# Patient Record
Sex: Female | Born: 1935 | Race: White | Hispanic: No | Marital: Married | Smoking: Former smoker
Health system: Southern US, Community
[De-identification: ages and names within clinical notes are randomized; demographics above are authoritative.]

## PROBLEM LIST (undated history)

## (undated) DIAGNOSIS — S060X9A Concussion with loss of consciousness of unspecified duration, initial encounter: Secondary | ICD-10-CM

## (undated) DIAGNOSIS — T4145XA Adverse effect of unspecified anesthetic, initial encounter: Secondary | ICD-10-CM

## (undated) DIAGNOSIS — K219 Gastro-esophageal reflux disease without esophagitis: Secondary | ICD-10-CM

## (undated) DIAGNOSIS — R51 Headache: Secondary | ICD-10-CM

## (undated) DIAGNOSIS — F329 Major depressive disorder, single episode, unspecified: Secondary | ICD-10-CM

## (undated) DIAGNOSIS — E785 Hyperlipidemia, unspecified: Secondary | ICD-10-CM

## (undated) DIAGNOSIS — T8859XA Other complications of anesthesia, initial encounter: Secondary | ICD-10-CM

## (undated) DIAGNOSIS — M1990 Unspecified osteoarthritis, unspecified site: Secondary | ICD-10-CM

## (undated) DIAGNOSIS — J309 Allergic rhinitis, unspecified: Secondary | ICD-10-CM

## (undated) DIAGNOSIS — K5790 Diverticulosis of intestine, part unspecified, without perforation or abscess without bleeding: Secondary | ICD-10-CM

## (undated) DIAGNOSIS — F32A Depression, unspecified: Secondary | ICD-10-CM

## (undated) DIAGNOSIS — C50919 Malignant neoplasm of unspecified site of unspecified female breast: Secondary | ICD-10-CM

## (undated) DIAGNOSIS — G709 Myoneural disorder, unspecified: Secondary | ICD-10-CM

## (undated) DIAGNOSIS — F419 Anxiety disorder, unspecified: Secondary | ICD-10-CM

## (undated) HISTORY — PX: MASTECTOMY: SHX3

## (undated) HISTORY — PX: REPLACEMENT TOTAL KNEE: SUR1224

## (undated) HISTORY — PX: STAPEDECTOMY: SHX2435

## (undated) HISTORY — DX: Malignant neoplasm of unspecified site of unspecified female breast: C50.919

## (undated) HISTORY — PX: NASAL SINUS SURGERY: SHX719

## (undated) HISTORY — DX: Allergic rhinitis, unspecified: J30.9

## (undated) HISTORY — DX: Diverticulosis of intestine, part unspecified, without perforation or abscess without bleeding: K57.90

## (undated) HISTORY — PX: TOTAL KNEE ARTHROPLASTY: SHX125

## (undated) HISTORY — PX: ABDOMINAL HYSTERECTOMY: SHX81

## (undated) HISTORY — DX: Hyperlipidemia, unspecified: E78.5

## (undated) HISTORY — PX: SHOULDER SURGERY: SHX246

---

## 1997-04-15 ENCOUNTER — Encounter: Admission: RE | Admit: 1997-04-15 | Discharge: 1997-07-14 | Payer: Self-pay

## 1997-04-20 ENCOUNTER — Ambulatory Visit (HOSPITAL_COMMUNITY): Admission: RE | Admit: 1997-04-20 | Discharge: 1997-04-20 | Payer: Self-pay

## 1998-12-26 ENCOUNTER — Other Ambulatory Visit: Admission: RE | Admit: 1998-12-26 | Discharge: 1998-12-26 | Payer: Self-pay | Admitting: Obstetrics and Gynecology

## 1999-12-26 ENCOUNTER — Other Ambulatory Visit: Admission: RE | Admit: 1999-12-26 | Discharge: 1999-12-26 | Payer: Self-pay | Admitting: Obstetrics and Gynecology

## 2001-01-06 ENCOUNTER — Other Ambulatory Visit: Admission: RE | Admit: 2001-01-06 | Discharge: 2001-01-06 | Payer: Self-pay | Admitting: Obstetrics and Gynecology

## 2001-08-18 ENCOUNTER — Encounter: Admission: RE | Admit: 2001-08-18 | Discharge: 2001-08-18 | Payer: Self-pay | Admitting: Internal Medicine

## 2001-08-18 ENCOUNTER — Encounter: Payer: Self-pay | Admitting: Internal Medicine

## 2002-09-30 ENCOUNTER — Encounter (INDEPENDENT_AMBULATORY_CARE_PROVIDER_SITE_OTHER): Payer: Self-pay | Admitting: Specialist

## 2002-09-30 ENCOUNTER — Ambulatory Visit (HOSPITAL_BASED_OUTPATIENT_CLINIC_OR_DEPARTMENT_OTHER): Admission: RE | Admit: 2002-09-30 | Discharge: 2002-09-30 | Payer: Self-pay | Admitting: Orthopedic Surgery

## 2002-12-08 ENCOUNTER — Ambulatory Visit (HOSPITAL_COMMUNITY): Admission: RE | Admit: 2002-12-08 | Discharge: 2002-12-08 | Payer: Self-pay | Admitting: Internal Medicine

## 2004-02-02 ENCOUNTER — Ambulatory Visit: Payer: Self-pay | Admitting: Oncology

## 2004-04-18 ENCOUNTER — Ambulatory Visit: Payer: Self-pay | Admitting: Oncology

## 2004-07-12 ENCOUNTER — Encounter: Admission: RE | Admit: 2004-07-12 | Discharge: 2004-07-12 | Payer: Self-pay | Admitting: Orthopedic Surgery

## 2004-07-24 ENCOUNTER — Encounter: Admission: RE | Admit: 2004-07-24 | Discharge: 2004-07-24 | Payer: Self-pay | Admitting: Orthopedic Surgery

## 2004-07-25 ENCOUNTER — Ambulatory Visit (HOSPITAL_COMMUNITY): Admission: RE | Admit: 2004-07-25 | Discharge: 2004-07-25 | Payer: Self-pay | Admitting: Orthopedic Surgery

## 2004-07-25 ENCOUNTER — Ambulatory Visit (HOSPITAL_BASED_OUTPATIENT_CLINIC_OR_DEPARTMENT_OTHER): Admission: RE | Admit: 2004-07-25 | Discharge: 2004-07-25 | Payer: Self-pay | Admitting: Orthopedic Surgery

## 2004-10-20 ENCOUNTER — Encounter: Admission: RE | Admit: 2004-10-20 | Discharge: 2004-10-20 | Payer: Self-pay | Admitting: Internal Medicine

## 2004-11-13 ENCOUNTER — Ambulatory Visit (HOSPITAL_BASED_OUTPATIENT_CLINIC_OR_DEPARTMENT_OTHER): Admission: RE | Admit: 2004-11-13 | Discharge: 2004-11-14 | Payer: Self-pay | Admitting: Orthopedic Surgery

## 2004-11-13 ENCOUNTER — Ambulatory Visit (HOSPITAL_COMMUNITY): Admission: RE | Admit: 2004-11-13 | Discharge: 2004-11-13 | Payer: Self-pay | Admitting: Orthopedic Surgery

## 2005-06-06 ENCOUNTER — Ambulatory Visit: Payer: Self-pay | Admitting: Internal Medicine

## 2005-06-06 ENCOUNTER — Encounter: Admission: RE | Admit: 2005-06-06 | Discharge: 2005-06-06 | Payer: Self-pay | Admitting: Internal Medicine

## 2005-11-29 ENCOUNTER — Ambulatory Visit (HOSPITAL_COMMUNITY): Admission: RE | Admit: 2005-11-29 | Discharge: 2005-11-29 | Payer: Self-pay | Admitting: Orthopedic Surgery

## 2006-03-13 ENCOUNTER — Ambulatory Visit: Payer: Self-pay | Admitting: Internal Medicine

## 2006-04-10 ENCOUNTER — Ambulatory Visit: Payer: Self-pay | Admitting: Gastroenterology

## 2006-05-07 ENCOUNTER — Ambulatory Visit: Payer: Self-pay | Admitting: Gastroenterology

## 2006-06-11 ENCOUNTER — Encounter: Admission: RE | Admit: 2006-06-11 | Discharge: 2006-06-11 | Payer: Self-pay | Admitting: Internal Medicine

## 2006-08-01 ENCOUNTER — Inpatient Hospital Stay (HOSPITAL_COMMUNITY): Admission: RE | Admit: 2006-08-01 | Discharge: 2006-08-02 | Payer: Self-pay | Admitting: Orthopedic Surgery

## 2007-03-14 ENCOUNTER — Ambulatory Visit: Payer: Self-pay | Admitting: Internal Medicine

## 2007-03-24 ENCOUNTER — Encounter: Payer: Self-pay | Admitting: Internal Medicine

## 2007-03-24 ENCOUNTER — Ambulatory Visit: Payer: Self-pay

## 2007-03-31 ENCOUNTER — Telehealth (INDEPENDENT_AMBULATORY_CARE_PROVIDER_SITE_OTHER): Payer: Self-pay | Admitting: *Deleted

## 2007-03-31 ENCOUNTER — Ambulatory Visit: Payer: Self-pay | Admitting: Internal Medicine

## 2007-03-31 DIAGNOSIS — K219 Gastro-esophageal reflux disease without esophagitis: Secondary | ICD-10-CM | POA: Insufficient documentation

## 2007-03-31 DIAGNOSIS — J309 Allergic rhinitis, unspecified: Secondary | ICD-10-CM | POA: Insufficient documentation

## 2007-03-31 DIAGNOSIS — J209 Acute bronchitis, unspecified: Secondary | ICD-10-CM

## 2007-04-07 ENCOUNTER — Inpatient Hospital Stay (HOSPITAL_COMMUNITY): Admission: RE | Admit: 2007-04-07 | Discharge: 2007-04-10 | Payer: Self-pay | Admitting: Orthopedic Surgery

## 2007-06-16 ENCOUNTER — Encounter: Admission: RE | Admit: 2007-06-16 | Discharge: 2007-06-16 | Payer: Self-pay | Admitting: Internal Medicine

## 2008-01-01 ENCOUNTER — Ambulatory Visit: Payer: Self-pay | Admitting: Internal Medicine

## 2008-01-14 ENCOUNTER — Telehealth (INDEPENDENT_AMBULATORY_CARE_PROVIDER_SITE_OTHER): Payer: Self-pay | Admitting: *Deleted

## 2008-05-27 ENCOUNTER — Ambulatory Visit: Payer: Self-pay | Admitting: Internal Medicine

## 2008-05-27 ENCOUNTER — Telehealth (INDEPENDENT_AMBULATORY_CARE_PROVIDER_SITE_OTHER): Payer: Self-pay | Admitting: *Deleted

## 2008-06-01 ENCOUNTER — Telehealth (INDEPENDENT_AMBULATORY_CARE_PROVIDER_SITE_OTHER): Payer: Self-pay | Admitting: *Deleted

## 2008-06-29 ENCOUNTER — Encounter: Admission: RE | Admit: 2008-06-29 | Discharge: 2008-06-29 | Payer: Self-pay | Admitting: Internal Medicine

## 2009-02-15 DEATH — deceased

## 2009-07-04 ENCOUNTER — Encounter: Admission: RE | Admit: 2009-07-04 | Discharge: 2009-07-04 | Payer: Self-pay | Admitting: Internal Medicine

## 2009-07-15 DEATH — deceased

## 2010-05-05 ENCOUNTER — Other Ambulatory Visit: Payer: Self-pay | Admitting: Dermatology

## 2010-05-30 NOTE — Discharge Summary (Signed)
[name], [name]            ACCOUNT NO.:  [id number]   MEDICAL RECORD NO.:  [id number]          PATIENT TYPE:  INP   LOCATION:  1604                         FACILITY:  [facility]   PHYSICIAN:  [name], M.D.    DATE OF BIRTH:  [birth year]   DATE OF ADMISSION:  04/07/2007  DATE OF DISCHARGE:  04/10/2007                               DISCHARGE SUMMARY   ADMITTING DIAGNOSES:  1. Osteoarthritis left knee.  2. History of bronchitis.  3. Cardiac murmur.  4. Hypercholesterolemia.  5. History of atrial fibrillation (one episode in the past).  6. Left-sided breast cancer status post mastectomy.  7. Mildly stenotic aortic valve.   DISCHARGE DIAGNOSES:  1. Osteoarthritis left knee status post left total knee replacement      arthroplasty.  2. Mild postop hypokalemia improved.  3. History of bronchitis.  4. Cardiac murmur.  5. Hypercholesterolemia.  6. History of atrial fibrillation (one episode in the past).  7. Left-sided breast cancer status post mastectomy.  8. Mildly stenotic aortic valve.   PROCEDURE:  April 07, 2007 left total knee.  Surgeon:  Dr. Lequita Harper.  Assistant:  Melanie Peace PA-C.  Surgery performed under spinal  anesthesia.  Tourniquet time 40 minutes.   CONSULTATIONS:  None.   BRIEF HISTORY:  [name] is a 77 year old female with severe  arthritis of the left knee, progressive worsening pain and dysfunction  who failed conservative management now presents for total knee.   LABORATORY DATA:  CBC preop showed hemoglobin 14.4, hematocrit 41.7.  Chem panel all within normal limits.  PT/INR 13.1 and 1, PTT 28.  Blood  group type A+.  Serial CBC's were followed.  Hemoglobin did drop down to  12.1 and then 10.9.  Last H&H 10.8 and 31.6.  Serial protimes followed.  PT/INR 24.6 and 2.1.  Chem panels followed.  Serial BMET's.  Potassium  did drop down to 3.4 and came back up to 3.7.   X-rays two view chest dated September 24, 2005:  Stable chest, no active  disease.   Preop chest April 03, 2007:  No acute findings.  EKG dated  February 14, 2007:  Borderline EKG, sinus rhythm, poor R wave progression  probably normal variant confirmed by Dr. Felipa Harper.   HOSPITAL COURSE:  The patient admitted to [facility],  tolerated the procedure well, and later transferred to the recovery room  on the orthopedic floor.  Started on PCA and p.o. analgesic pain control  following surgery.  Had a little bit of itching through the night that  was better by the morning of day #1.  Was tolerating her meds.  Started  back on her home medications.  Encouraged pulmonary toilet.  Got up out  of bed on day #1.  Had excellent urinary output.  Decreased her fluids.  Weaned over to p.o. meds.  By day #2 DC'd the PCA.  Dressing change  incision looked good.  At that time she was up and ambulating about 20  feet and later about 80 feet that afternoon.  Incision looked great, no  signs of infection, had still excellent output.  Hemoglobins 10.9.  She  was asymptomatic with mild drop.  She continued to progress well, and  day #3 she was taking p.o. meds well.  Knee looked good; progressing  with therapy.  It was decided that patient will be discharged home at  that time.   DISCHARGE/PLAN:  1. The patient discharged home April 10, 2007.  2. Discharge Diagnoses please see above.  3. Discharge meds:  Coumadin, Percocet, Robaxin.  Follow-up two weeks.   ACTIVITY:  She is weightbearing as tolerated left lower extremity.  Home  health PT, home health nursing, total knee protocol.   DISPOSITION:  Home.   CONDITION ON DISCHARGE:  Improved.      [name], P.A.C.      [name], M.D.  Electronically Signed    ALP/MEDQ  D:  04/10/2007  T:  04/10/2007  Job:  [id number]   cc:   [name], M.D.  Fax: [phone]   Melanie D. Maple Hudson, MD, FCCP, FACP  [facility]-Pulmonary Dept  [address]  [place]  [place] [address]   Melanie Salvia, MD,  [facility]  [address]  [address]  [place]  [place] [address]

## 2010-05-30 NOTE — Letter (Signed)
March 14, 2007    Larina Earthly, M.D.  [address]  [place], [place] [address]   RE:  Melanie, Harper  MRN:  [id number]  /  DOB:  [birth year]   Dear Daleen Bo:   It was a pleasure to see your patient, [name] at your  request regarding an episode of tachy palpitations.   As you know, she is a 77 year old woman with impending knee surgery who,  about a month ago, had an episode of abrupt onset tachy palpitations.  This occurred while she was up and was associated with lightheadedness  and some dyspnea.  She took 1 of her husband's beta-blockers and the  episode terminated after about 30 minutes.  It may or may not have been  positive.  They were day Oretic negative.  They were not associated with  any significant chest discomfort.  She has had no prior episodes and no  subsequent episodes.  She does note that while these were rapid they  were also very regular.   Her cardiac evaluation here today included only electrocardiogram which  is abnormal with poor R-wave progression into lead V3.   PAST MEDICAL HISTORY:  In addition to the above is notable for  dyslipidemia, arthritis, breast cancer status post mastectomy,  allergies.   PAST SURGICAL HISTORY:  1. Orthopedic surgery included a right and left rotator cuff.  2. Left and right knee arthroscopy.  3. Impending knee surgery.  4. Mastectomy.  5. Appendectomy.  6. Hysterectomy.  7. Sinus surgery.   Her medications currently included Evista, Zocor 80, amitriptyline 25,  aspirin 81, Advair p.r.n., Ambien p.r.n. nasal irrigation, and Flonase  p.r.n.   SOCIAL HISTORY:  She is married.  She has three children and eight  grandchildren.  She does not use cigarettes or recreational drugs.  She  does drink 3 ounces of bourbon and 6 ounces wine daily.  She does not  use caffeine.  She does work out modestly, but this is severely limited  by her knee problems.   EXAMINATION:  She is an elderly Caucasian female  appearing her stated  age of 6.  Her blood pressure is 135/85 with a pulse of 75.  Her weight was 188.  She describes herself as 5 feet 3 inches tall.  HEENT exam demonstrated no icterus or xanthoma.   ABDOMEN:  Soft with active bowel sounds without midline pulsation or  hepatomegaly.  Femoral pulses were 2+.  Distal pulses were intact.  There is no  clubbing, cyanosis or edema.  Neurological exam was grossly normal.  The skin was warm and dry.   Electrocardiogram was notable as described previously with poor R-wave  progression across the anterior precordium.   IMPRESSION:  1. Tachy palpitations.  Mechanism unknown.  2. Abnormal electrocardiogram.  3. Systolic murmur consistent with aortic stenosis - mild to moderate.  4. Impending knee surgery.   ADDENDUM:   PHYSICAL EXAM:  NECK:  Carotids were brisk.  Carotids were full but  somewhat delayed.  Lungs were clear.  BACK:  Without kyphosis, scoliosis.  HEART:  Sounds were regular with an S4 and 3/6 early to mid peaking  systolic murmur.   Daleen Bo, [name] has significant episodes of palpitations which likely  represents supraventricular tachycardia but which in the context of her  abnormal electrocardiogram, we need to be concern that it was not  ventricular tachycardia.  The likelihood of that would derive from her  left ventricular systolic function and the presence of  wall motion  abnormality.   Is also getting said to have major orthopedic surgery looking at her  heart with an echo to assess the status of her aortic valve would also  be timely and to that end I have ordered an echo for the two above  results.  I plan to sit down with her to review the in the event that  they are abnormal otherwise will forward Dr. [name] and to you  clearance for.  As relates to recurrence her tachycardia,  we have no  idea that time frame will be at this point I would not encourage her to  any long-term medications.  I did tell  her she take a p.r.n. dose her  husband's beta blocker and that if the frequency of these episodes  recur, I would be glad to see her again as a to with an try to sort out  or specifically what they are aware treatment options are.   Thank you much for asking [place] to see her.    Sincerely,      Duke Salvia, MD, [facility]  Electronically Signed    SCK/MedQ  DD: 03/14/2007  DT: 03/14/2007  Job #: [id number]

## 2010-05-30 NOTE — Op Note (Signed)
[name], [name]            ACCOUNT NO.:  [id number]   MEDICAL RECORD NO.:  [id number]          PATIENT TYPE:  AMB   LOCATION:  DAY                          FACILITY:  [facility]   PHYSICIAN:  Marlowe Kays, M.D.  DATE OF BIRTH:  [birth year]   DATE OF PROCEDURE:  07/31/2006  DATE OF DISCHARGE:                               OPERATIVE REPORT   PREOPERATIVE DIAGNOSES:  1. Degenerative arthritis acromioclavicular joint.  2. Full-thickness retracted rotator cuff tear right shoulder.   POSTOPERATIVE DIAGNOSES:  1. Degenerative arthritis acromioclavicular joint.  2. Full-thickness retracted rotator cuff tear right shoulder.   OPERATION:  Open resection distal clavicle and anterior acromionectomy  with repair of torn rotator cuff right shoulder.   SURGEON:  Marlowe Kays, M.D.   ASSISTANT:  Mr. Adrian Blackwater   ANESTHESIA:  General pathology.   PATHOLOGY AND JUSTIFICATION FOR PROCEDURE:  She has a history of left  shoulder rotator cuff tear and repair which has done well.  She injured  he right shoulder when she fell on 05/25/2006.  An MRI has demonstrated  a retracted full-thickness rotator cuff tear as well as advanced  degenerative arthritis with edema at the [facility] joint.  It was felt that the  above-mentioned surgery was indicated.   PROCEDURE:  Prophylactic antibiotics, IV was placed in her operated  right upper extremity because of previous breast cancer surgery with  node dissection on the left.  Beach-chair position on the Reedley frame,  time-out performed, right shoulder was prepped with DuraPrep, draped in  sterile field.  Ioban employed.  I made an incision over the distal  clavicle curving somewhat in hockey stick fashion and then proceeding  vertically down to about the level of the greater tuberosity.  Periosteal dissection exposed the [facility] joint which was badly degenerated  and measured off the distal centimeter and a half of the clavicle.  I  undermined the clavicle  at this point and placed some baby Hohmann  retractors and made my amputation of the clavicle with a micro saw.  Her  bone was so fragile that I had to removed the portion for resection  piecemeal with small rongeur.  Bleeders were coagulated and bone wax  placed over the distal clavicle.  I then continued the dissection  lateral ward exposing the anterior acromion which had a flange sticking  anteriorly.  I marked out my initial line for anterior acromionectomy  and again had to remove the fragment of bone piecemeal.  I then made a  second decompression cut removing additional bone posteriorly and  inferiorly with this completely freed up the rotator cuff.  Then  identified the pathology.  Her biceps tendon was intact.  She had mainly  an anterior flap which was retracted about 3 cm from lateral to medial  and about 2.5 cm widthwise.  This was flexible.  I roughened up the  articular cartilage of the humeral head adjacent to where we were going  to reattach the tendon and then used two rotator cuff anchors to bring  the flap back to its original position.  I supplemented these with  multiple  interrupted #1 Ethibond sutures.  This seemed to give a nice  stable repair with the arm to her side.  Wound was irrigated with  sterile saline and Gelfoam was placed in the distal clavicle resection  site soft tissues were infiltrated with half percent Marcaine with  adrenaline and the wound closed with interrupted #1 Vicryl in the slit  in the deltoid muscle and then the fascia overlying the remaining  acromion and clavicle resection site.  Subcutaneous tissue was  reapproximated with 2-0 Vicryl, skin with Steri-Strips.  Dry sterile  dressing and short immobilizer applied.  She tolerated the procedure  well was taken to recovery in satisfactory with no known complications.           ______________________________  Marlowe Kays, M.D.     JA/MEDQ  D:  07/31/2006  T:  08/01/2006  Job:   [id number]

## 2010-05-30 NOTE — Op Note (Signed)
[name]            ACCOUNT NO.:  [id number]   MEDICAL RECORD NO.:  [id number]          PATIENT TYPE:  INP   LOCATION:  0007                         FACILITY:  [facility]   PHYSICIAN:  Ollen Gross, M.D.    DATE OF BIRTH:  [birth year]   DATE OF PROCEDURE:  04/07/2007  DATE OF DISCHARGE:                               OPERATIVE REPORT   PREOPERATIVE DIAGNOSIS:  Osteoarthritis left knee.   POSTOPERATIVE DIAGNOSIS:  Osteoarthritis left knee.   PROCEDURE:  Left total knee arthroplasty.   SURGEON:  Ollen Gross, M.D.   ASSISTANT:  Avel Peace PA-C   ANESTHESIA:  Spinal.   ESTIMATED BLOOD LOSS:  Minimal.   DRAINS:  None.   TOURNIQUET TIME:  40 minutes at 300 mmHg.   COMPLICATIONS:  None.   CONDITION:  Stable to recovery room.   CLINICAL NOTE:  [name] is a 77 year old female with severe  arthritis of the left knee with progressively worsening pain and  dysfunction.  She has failed nonoperative management and presents for  total knee arthroplasty.   PROCEDURE IN DETAIL:  After successful administration of spinal  anesthetic, a tourniquet is placed high on her left thigh and left lower  extremity prepped and draped in the usual sterile fashion.  Extremity  was wrapped in Esmarch, knee flexed and tourniquet inflated to 300 mmHg.  A midline incision made with a 10 blade through subcutaneous tissue to  the level of the extensor mechanism.  A fresh blade is used to make a  medial parapatellar arthrotomy.  Soft tissue over the proximal medial  tibia is subperiosteally elevated to the joint line with the knife and  into the semimembranosus bursa with a Cobb elevator.  Soft tissue  laterally is elevated with attention being paid to avoid the patellar  tendon on the tibial tubercle.  Patella subluxed laterally, knee flexed  90 degrees and ACL and PCL removed.  Drill was used to create a starting  hole in the distal femur and the canal was thoroughly irrigated.  The 5  degrees left valgus alignment guide is placed and referencing off the  posterior condyles rotations marked and the block pinned to remove 11 mm  of the distal femur.  I took 11 because of preop flexion contracture.  Distal femoral resection is made with an oscillating saw.  Sizing block  is placed and size 3 is most appropriate.  Rotations marked off the  epicondylar axis and a size 3 cutting block placed.  The anterior-  posterior and chamfer cuts were then made.   The tibia was subluxed forward and the menisci removed.  The  extramedullary tibial alignment guide is placed referencing proximally  at the medial aspect of the tibial tubercle and distally along the  second metatarsal axis and tibial crest.  The block is pinned to remove  about 10 mm off the non deficient lateral side.  Tibial resection is  made with an oscillating saw.  Size 3 is the most appropriate tibial  component and the proximal tibia is prepared with the modular drill and  keel punch for a size 3.  Femoral preparation was then completed with  the intercondylar cut.   Size 3 mobile bearing tibial trial, size 3 posterior stabilized femoral  trial and a 10 mm posterior stabilized rotating platform insert trial  are placed.  With the 10 there is a tiny bit of varus-valgus play so I  went to 12.5.  The 12.5 allowed for full extension with excellent varus  and valgus, anterior and posterior balance throughout full range of  motion.  Patella was everted and thickness measured to be 22 mm.  Freehand resection is taken to 13 mm, 35 template is placed, lug holes  were drilled, trial patella was placed and it tracks normally.  Osteophytes were then removed from the posterior femur with the trial in  place.  All trials removed and the cut bone surfaces are prepared with  pulsatile lavage.  Cement was mixed and once ready for implantation, a  size 3 mobile bearing tibial tray, size 3 posterior stabilized femur and  35 patella  are cemented into place.  The patella was held the clamp.  Trial 12.5 inserts placed, knee held in full extension and all extruded  cement removed.  Once cement fully hardened, then the permanent 12.5 mm  posterior stabilized rotating platform insert is placed into a tibial  tray.  The wound was copiously irrigated with saline solution.  The  FloSeal was injected on the posterior capsule, mediolateral gutters and  suprapatellar area.  Moist sponge is placed and the tourniquet released  for total time of 40 minutes.  The sponge was held for a couple of  minutes then removed.  Minimal bleeding encountered.  That which was  encountered was stopped with electrocautery.  The wound was again  copiously irrigated and then the extensor mechanism closed with  interrupted #1 PDS.  Flexion against gravity is 135 degrees.  The subcu  is then closed with interrupted 2-0 Vicryl, subcuticular with running 4-  0 Monocryl.  The incisions cleaned and dried and Steri-Strips and bulky  sterile dressing applied.  She is then placed into a knee immobilizer,  awakened and transferred to recovery in stable condition.      Ollen Gross, M.D.  Electronically Signed     FA/MEDQ  D:  04/07/2007  T:  04/07/2007  Job:  [id number]

## 2010-05-30 NOTE — H&P (Signed)
NAME:  [name], [name]            ACCOUNT NO.:  [id number]   MEDICAL RECORD NO.:  [id number]          PATIENT TYPE:  INP   LOCATION:  NA                           [facility]ACILITY[facility]  [facility]   PHYSICIAN:  Ollen Gross, M.D.    DATE OF BIRTH:  10/19/1929   DATE OF ADMISSION:  04/07/2007  DATE OF DISCHARGE:                              HISTORY & PHYSICAL   CHIEF COMPLAINT:  Left knee pain.   HISTORY OF PRESENT ILLNESS:  The patient is a 77 year old female who has  been seen by Dr. [name] for ongoing left knee pain.  She has been  treated by Dr. Simonne Come in the past.  She had a knee scope back in  2007, but never got good relief.  She has also had knee scopes on the  right knee once a Melanie and was by Dr. Simonne Come and the right knee seems  to be doing okay but the left knee continues to have pain.  She was seen  by Dr. [name] and found to have some exposed bone in the trochlea and  the patella on her previous arthroscopic pictures with high-grade  chondromalacia.  She has some significant dysfunction and progressively  getting worse.  She does have some bone-on-bone arthritic changes.  She  has undergone arthroscopy, cortisone and Hyalgan and at this point it  was felt she would benefit from undergoing a knee replacement.  Risks  and benefits have been discussed and she elects to proceed with surgery.   ALLERGIES:  NO KNOWN DRUG ALLERGIES.   CURRENT MEDICATIONS:  Evista, Zocor, amitriptyline, calcium plus D,  multivitamin, aspirin, glucosamine, vitamin D, Advair discus as needed,  Ambien as needed, Flonase nasal spray and Allegra as needed.   PAST MEDICAL HISTORY:  1. History of bronchitis.  2. Cardiac murmur.  3. Elevated cholesterol.  4. History of atrial fibrillation x1 episode.  5. Left-sided breast cancer.  6. Mild stenotic aortic valve.   PAST SURGICAL HISTORY:  1. Rotator cuff surgery.  2. Left knee arthroscopy.  3. Two right knee arthroscopies.  4. Sinus surgery.  5.  Finger surgery.  6. Hysterectomy.  7. Mastectomy secondary to cancer.   FAMILY HISTORY:  Father with history of uremic poisoning.  Mother with  history of breast cancer.  Sister with history of pancreatic cancer.   SOCIAL HISTORY:  Married, retired, past smoker for about seven years  many years ago.  Two drinks of alcohol a day.  Three children.  Husband  will be assisting with care after surgery.   REVIEW OF SYSTEMS:  GENERAL:  No fevers, chills or night sweats.  However, the patient has had a recent cold for about six days now.  She  has been placed on Augmentin twice a day for 10 days.  She will be  seeing her medical physician Friday before surgery.  NEURO:  No  seizures, syncope or paralysis.  RESPIRATORY:  No shortness breath,  productive cough or hemoptysis.  CARDIOVASCULAR:  Chest pain, angina or  orthopnea.  GI:  No nausea, vomiting, diarrhea or constipation.  GU:  No  dysuria, hematuria or discharge.  Musculoskeletal: Left knee.   PHYSICAL EXAMINATION:  VITAL SIGNS:  Pulse 64, respirations 12, blood  pressure 124/72.  GENERAL:  A 77 year old white female well-nourished, well-developed, no  acute distress.  She is alert, oriented and cooperative, very pleasant.  HEENT: Normocephalic, atraumatic.  Pupils round and reactive.  EOMs  intact.  NECK:  Supple.  CHEST:  Clear anterior, posterior chest walls.  No rhonchi, rales or  wheezing.  No other adventitious sounds.  HEART:  Regular rate and rhythm.  She does have a systolic ejection  murmur grade 3/6 best heard over the aortic point and it corresponds  with her stenotic aortic valve.  ABDOMEN:  Soft, slightly round.  Bowel sounds present.  RECTAL/BREASTS/GENITALIA:  Not done, not pertinent to present illness.  EXTREMITIES:  Left knee range of motion 7-150, marked crepitus, tender  more medial than lateral.   IMPRESSION:  1. Osteoarthritis left knee.  2. History of bronchitis.  3. Cardiac murmur.  4.  Hypercholesterolemia.  5. History of atrial fibrillation, one episode in the past.  6. Left-sided breast cancer status post mastectomy.  7. Mildly stenotic aortic valve.   PLAN:  The patient admitted to [facility] to go a left total  knee replacement arthroplasty.  Surgery will be performed by Dr. Ollen Gross.      [name], P.A.C.      Ollen Gross, M.D.  Electronically Signed    ALP/MEDQ  D:  04/03/2007  T:  04/03/2007  Job:  [id number]   cc:   [name], M.D.  Fax: [phone]   Melanie D. Maple Hudson, MD, FCCP, FACP  [name]  [address]  [place]  [place] [address]   Melanie Salvia, MD, [facility]  [address]  [address]  [place]  [place] [address]

## 2010-06-01 ENCOUNTER — Other Ambulatory Visit: Payer: Self-pay | Admitting: Internal Medicine

## 2010-06-01 DIAGNOSIS — Z9012 Acquired absence of left breast and nipple: Secondary | ICD-10-CM

## 2010-06-01 DIAGNOSIS — Z1231 Encounter for screening mammogram for malignant neoplasm of breast: Secondary | ICD-10-CM

## 2010-06-02 NOTE — Assessment & Plan Note (Signed)
[facility]                             PULMONARY OFFICE NOTE   NAME:JOHNSONAlyana, Kreiter                   MRN:          [id number]  DATE:03/16/2006                            DOB:          [birth year]    PROBLEM LIST:  1. Allergic rhinitis.  2. Asthmatic bronchitis.  3. Esophageal reflux.   HISTORY:  Ten days of increased productive cough especially when she  lies down. She has been trying to swim in an indoor pool for exercise  and blames the smell of the chlorine, especially with this indoor pool,  for being an irritate.  No sinus or throat problems. She never filled  the scripts I gave her last year for p.r.n. use of Biaxin and a  prednisone taper.   MEDICATIONS:  1. Zocor.  2. Evista.  3. Amitriptyline.  4. Calcium.  5. Multivitamins.  6. Advair 100/50 used p.r.n.  7. Prednisone 20 mg tablets she has been very reserved about using an      occasional one a day p.r.n. basis.   ALLERGIES:  DRUG INTOLERANT OF NEXIUM.  OBJECTIVE:  VITAL SIGNS: Weight 187 pounds. Blood pressure 118/72, pulse 87, room  air saturation 97%.  PULMONARY: Mildly congested sounding cough with no dullness, increased  work of breathing, adenopathy or obvious discomfort.  CARDIOVASCULAR: Heart sounds are regular without murmur.   IMPRESSION:  Rhinitis and bronchitis, most likely a winter viral  syndrome.   PLAN:  Xopenex 1.25 mg nebulization treatment here.  Depo Medrol 80 mg IM with steroid talk.  Use her Advair 100/50 regularly b.i.d. This and Flonase were refilled.  Phenergan with codeine cough syrup 200 mL 5 mL q.6 hours p.r.n.   We reviewed medication options and supportive care. Scheduled to return  in 1 year but earlier p.r.n. failure to clear.     Clinton D. Maple Hudson, MD, [facility], FACP  Electronically Signed    CDY/MedQ  DD: 03/16/2006  DT: 03/16/2006  Job #: [id number]   cc:   Larina Earthly, M.D.

## 2010-06-02 NOTE — Op Note (Signed)
NAME:  [name], [name]            ACCOUNT NO.:  [id number]   MEDICAL RECORD NO.:  [id number]          PATIENT TYPE:  AMB   LOCATION:  [facility]                         FACILITY:  [facility]   PHYSICIAN:  Marlowe Kays, M.D.  DATE OF BIRTH:  [birth year]   DATE OF PROCEDURE:  07/25/2004  DATE OF DISCHARGE:                                 OPERATIVE REPORT   PREOPERATIVE DIAGNOSIS:  1.  Torn medial meniscus.  2.  Multiple loose bodies.  3.  Osteoarthritis, right knee.   POSTOPERATIVE DIAGNOSIS:  1.  Torn medial and lateral meniscus.  2.  Multiple loose bodies.  3.  Osteoarthritis, right knee.   OPERATION:  Right knee arthroscopy with  1.  Partial medial and lateral meniscectomies.  2.  Removal of multiple loose bodies.  3.  Debridement of suprapatellar area and medial femoral condyles.   SURGEON:  Marlowe Kays, M.D.   ASSISTANT:  Nurse   ANESTHESIA:  General.   PATHOLOGY AND JUSTIFICATION FOR PROCEDURE:  She has a history of having  right knee arthroscopy at [facility] in February 1999.  At that time, she had a  complex tear of the medial meniscus and partial medial meniscectomy was  performed.  She also was noted to have chondromalacia of the knee with some  shaving.  Recently, she has developed progressive pain and swelling in her  right knee leading to an MRI which was performed on July 12, 2004, with the  findings listed under preoperative diagnosis.  Because of this, she is here  today for the above mentioned surgery.  See operative description below for  additional details.   DESCRIPTION OF PROCEDURE:  Satisfactory general anesthesia, pneumatic  tourniquet, the right leg was Esmarched out non-sterilely via stabilizer.  The leg was prepped with DuraPrep from the thigh holder to ankle and draped  in a sterile field.  On the left leg, we used an Ace wrap and a knee  support.  Superomedial saline inflow.  First, through an anterolateral  portal, the medial compartment of the knee  joint was evaluated.  She had  grade 3/4 chondromalacia of the medial femoral condyle which I smoothed  down.  The remnant of the posterior third of the medial meniscus was badly  torn.  This was pictured and resected back to the remaining stable rim.  I  looked up in the medial gutter and suprapatellar area.  She had a good bit  of synovitis present but I was unable to distinctly make out the meniscus or  see any loose bodies.  I came back to this area under reverse portal  technique.  Through an anteromedial portal, I looked laterally.  She had a  tear of the anterior third of the lateral meniscus and the entire meniscus  posteriorly from there was quite ragged and I shaved it down until smooth  with a 3.5 shaver after using small baskets to debride it back to a stable  rim.  In the posterior intercondylar area, she had a number of loose bodies  and a tear of the lateral meniscus and I resected all these.  I then  looked  up in the lateral gutter and suprapatellar area, she had another fragment in  the suprapatellar area which I removed with a 3.5 shaver.  I cleaned up all  the synovitis and took a final picture documenting the clean up.  Her  patella had full thickness wear and was not shaveable.  The knee joint was  then irrigated until clear and all fluid possible was removed.  The portals  were closed with 4-0 nylon.  20 mL of 0.5% Marcaine with adrenalin and 4 mg  morphine were then instilled through the inflow apparatus which was removed.  This portal was closed with 4-0 nylon, as well.  Betadine, Adaptic, dry,  sterile dressing were applied.  The tourniquet was released.  At the time of  this dictation, she was on her way to the recovery room satisfactory  condition with no known complications.       JA/MEDQ  D:  07/25/2004  T:  07/25/2004  Job:  [id number]

## 2010-06-02 NOTE — Op Note (Signed)
   NAME:  [name], HAMID                      ACCOUNT NO.:  [id number]   MEDICAL RECORD NO.:  [id number]                   PATIENT TYPE:  AMB   LOCATION:  DSC                                  FACILITY:  [facility]   PHYSICIAN:  Artist Pais. Mina Marble, M.D.           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   POSTOPERATIVE DIAGNOSES:  Left thumb interphalangeal joint arthritis with  large bone spur and mucus cyst.   PROCEDURE:  1. Excision of mucus cyst, left thumb interphalangeal joint.  2. Bone spur excision, left thumb interphalangeal joint.   SURGEON:  Artist Pais. Mina Marble, M.D.   ASSISTANT:  Aura Fey. Bobbe Medico.   ANESTHESIA:  Bier block.   TOURNIQUET TIME:  Was 35 minutes.   COMPLICATIONS:  None.   DRAINS:  None.   DESCRIPTION OF PROCEDURE:  The patient was taken to the operating room, with  the induction of adequate Bier block analgesia.  The left upper extremity  was prepped and draped in the usual sterile fashion.  Once this was done, an  L-shaped incision was made over the interphalangeal joint of the left thumb  and a large ulnarly-based flap was elevated.  A mucus cyst which had broken  through the skin was also elliptically excised in the skin incision, and  dissected down to the area overlying the dermal matrix, and this was excised  in its entirety and sent for pathologic confirmation.  At this point in  time, the extensor tendon was carefully freed from the underlying  periosteum, and the interphalangeal joint was entered, and three large bone  spurs were removed, one from the distal aspect of the middle phalanx, and  two from the proximal aspect of the distal phalanx.  The joint itself had a  complete synovectomy performed.  The wound was then thoroughly irrigated.   A  small rent in the extensor mechanism was repaired with a #4-0 Vicryl, and  the skin with #5-0 nylon.  A sterile dressing of Xeroform, 4 x 4s and a  Coban wrap were applied.  The patient tolerated the procedure well and went to the recovery room in  stable condition.                                                 Artist Pais Mina Marble, M.D.    MAW/MEDQ  D:  09/30/2002  T:  09/30/2002  Job:  [id number]

## 2010-06-02 NOTE — Op Note (Signed)
[name], [name]            ACCOUNT NO.:  [id number]   MEDICAL RECORD NO.:  [id number]          PATIENT TYPE:  AMB   LOCATION:  DAY                          FACILITY:  [facility]   PHYSICIAN:  Marlowe Kays, M.D.  DATE OF BIRTH:  [birth year]   DATE OF PROCEDURE:  11/29/2005  DATE OF DISCHARGE:                                 OPERATIVE REPORT   PREOPERATIVE DIAGNOSES:  1. Torn medial meniscus.  2. Possible tear lateral meniscus.  3. Osteoarthritis left knee.   POSTOPERATIVE DIAGNOSES:  1. Torn medial and lateral menisci.  2. Osteoarthritis left knee.   OPERATION:  1. Left knee arthroscopy with partial medial meniscectomies.  2. Debridement of medial femoral condyle.  3. Debridement of patella.   SURGEON:  Marlowe Kays, M.D.   ASSISTANT:  Nurse.   ANESTHESIA:  General.   PATHOLOGY AND JUSTIFICATION FOR PROCEDURE:  She has had a history of the  right knee arthroscopy, recently she has had pain and swelling in her left  knee and was sent for an MRI which performed 11/07/2005 which showed a  complex radial tear of the posterior horn medial meniscus as well as  possible tear of the posterior lateral meniscus and osteoarthritis.   PROCEDURE:  Satisfactory general anesthesia, pneumatic tourniquet, left leg  was Esmarched out non sterilely and prepped from ankle to thigh stabilizer  and draped in sterile field.  Superior medial saline inflow.  First through  an anterolateral portal medial compartment joint was evaluated.  She had a  good bit of synovitis which I resected. She had grade 2/4 chondromalacia of  the posterior portion of the medial femoral condyle which I debrided down  with a 3.5 shaver until smooth.  The tear of the posterior curve of the  meniscus did not appear to be as extensive as depicted on the MRI, perhaps  reflecting some grinding up of the meniscus since the MRI.  She has had a  good bit of pain in trying to ambulate.  I resected the major portion of  the  tear back to stable rim then shaved down the meniscus until smooth 3.5  shaver.  I probed the entire remaining meniscus into the posterior  intercondylar area and did not find any additional tearing.  Looking at the  medial gutter and suprapatellar area was she had some grade 3/4  chondromalacia of the patella which I partially shaved through this portal  and subsequently on the reverse portal technique shaved the lateral half.  Procedure then reversed portals.  She had some synovitis laterally which I  resected.  She had more of a tear of the posterior curve of the lateral  meniscus than medial and some intercondylar flaps which were noted.  I  debrided all this out with curved baskets and shaved down until smooth with  a 3.5 shaver.  Final pictures being taken, she did have some wear of both  the lateral femoral condyle, lateral tibial plateau which did not require  any debridement.  Looking at the lateral gutter and suprapatellar area, I  completed the patellar shaving and also noted some wear full-thickness  of  the superior portion of the lateral femoral condyle off the weightbearing  surface.  The knee joint was irrigated until clear and all fluid possible  removed.  Two anterior portals were closed with 4-0 nylon I then injected 20  mL 0.5% Marcaine with adrenalin and 4 mg of morphine through the inflow  apparatus which was removed and this portal closed 4-0 nylon as well.  Also  gave 15 mg of Toradol IV.  She tolerated the procedure well and was taken  recovery room satisfactory condition with no known complications.           ______________________________  Marlowe Kays, M.D.     JA/MEDQ  D:  11/29/2005  T:  11/29/2005  Job:  [id number]

## 2010-06-02 NOTE — Op Note (Signed)
[name], STENERSON            ACCOUNT NO.:  [id number]   MEDICAL RECORD NO.:  [id number]          PATIENT TYPE:  AMB   LOCATION:  DSC                          FACILITY:  [facility]   PHYSICIAN:  Marlowe Kays, M.D.  DATE OF BIRTH:  21-May-1929   DATE OF PROCEDURE:  11/13/2004  DATE OF DISCHARGE:                                 OPERATIVE REPORT   PREOPERATIVE DIAGNOSIS:  Torn rotator cuff, left shoulder.   POSTOPERATIVE DIAGNOSIS:  Torn rotator cuff, left shoulder.   OPERATION PERFORMED:  Anterior acromionectomy, repair of torn rotator cuff,  left shoulder.   SURGEON:  Marlowe Kays, M.D.   ASSISTANTDruscilla Brownie. Cherlynn June.   ANESTHESIA:  General.   INDICATIONS FOR PROCEDURE:  Very painful left shoulder with an MRI on July 12, 2004 demonstrating a full thickness tear with a 1 cm retraction of the  rotator cuff.  She had no acromioclavicular joint problems and it was felt  that open repair was indicated because of her pain.   DESCRIPTION OF PROCEDURE:  Prophylactic antibiotics, satisfactory general  anesthesia, beach chair position on a Schlein frame.  Left shoulder girdle  was prepped with DuraPrep, draped in a sterile field.  Ioban employed.  Vertical midline incision from roughly the [facility] joint down to the greater  tuberosity.  I opened the fascia over the anterior acromion in line with the  skin incision and split the deltoid for a centimeter or so until I could get  a small Cobb elevator beneath the anterior acromion which was quite tight.  I used a spinal needle to identify the [facility] joint and marked off the initial  line for anterior acromionectomy.  Using micro saw protecting underneath the  rotator cuff with the Cobb elevator, I performed my initial anterior  acromionectomy.  She had a very significant impingement problem and I made  two additional passes with the saw until I felt that we had a wide  decompression with her arm abducted.  The rotator cuff tear was  identified.  It measured about 1.5 cm in width with 1 cm of retraction.  This seemed to  be a full thickness retraction.  I looked underneath and did not see any  substantial layering of the rotator cuff.  After assessment of the tear, I  roughened up the bone just above the greater tuberosity with a small rongeur  and used a single Mitek anchor lateral to the greater tuberosity getting a  nice bite for the two arms of the anchor with the arms slightly abducted,  returned the retracted tendon to its initial resting place.  I then  supplemented this with multiple interrupted #1 Ethibond sutures using the  remainder of the anchor suture around the perimeter of the rotator cuff  flap, attaching it to the soft tissue over the lateral humerus. This gave me  nice smooth stable repair with her arm to her side.  I once again checked to  be sure there was no residual impingement, irrigated the wound well with  sterile saline and infiltrated all the soft tissues with 0.5% Marcaine with  Adrenalin.  I then closed the wound with interrupted  #1 Vicryl in two  layers in the deltoid muscle and in one layer with the fascia over the  anterior acromion with a nice tight closure.  Subcutaneous tissue was closed  with 2-0 Vicryl subcutaneously with Steri-Strips on the  skin.  Dry sterile dressing and shoulder immobilizer were applied.  The  patient tolerated the procedure well and at the time of this dictation was  on her way to the recovery room in satisfactory condition with no known  complications.           ______________________________  Marlowe Kays, M.D.     JA/MEDQ  D:  11/13/2004  T:  11/13/2004  Job:  [id number]

## 2010-07-07 ENCOUNTER — Ambulatory Visit: Payer: Self-pay

## 2010-07-20 ENCOUNTER — Ambulatory Visit: Payer: Self-pay

## 2010-08-11 ENCOUNTER — Ambulatory Visit: Payer: Self-pay

## 2010-08-15 ENCOUNTER — Ambulatory Visit
Admission: RE | Admit: 2010-08-15 | Discharge: 2010-08-15 | Disposition: A | Discharge status: Home / Self Care | Payer: Self-pay | Source: Ambulatory Visit | Attending: Internal Medicine | Admitting: Internal Medicine

## 2010-08-15 DIAGNOSIS — Z9012 Acquired absence of left breast and nipple: Secondary | ICD-10-CM

## 2010-08-15 DIAGNOSIS — Z1231 Encounter for screening mammogram for malignant neoplasm of breast: Secondary | ICD-10-CM

## 2010-10-09 LAB — CBC
HCT: 31.6 — ABNORMAL LOW
HCT: 35.1 — ABNORMAL LOW
Hemoglobin: 10.9 — ABNORMAL LOW
Hemoglobin: 12.1
MCHC: 34.4
MCHC: 34.5
MCV: 88.7
MCV: 89.4
Platelets: 224
Platelets: 239
Platelets: 264
RBC: 3.56 — ABNORMAL LOW
RBC: 3.93
RDW: 13.4
RDW: 13.5
RDW: 13.6
WBC: 10.8 — ABNORMAL HIGH
WBC: 11.2 — ABNORMAL HIGH
WBC: 12.4 — ABNORMAL HIGH

## 2010-10-09 LAB — TYPE AND SCREEN
ABO/RH(D): A POS
Antibody Screen: NEGATIVE

## 2010-10-09 LAB — ABO/RH: ABO/RH(D): A POS

## 2010-10-09 LAB — BASIC METABOLIC PANEL
BUN: 2 — ABNORMAL LOW
BUN: 5 — ABNORMAL LOW
BUN: 7
CO2: 29
CO2: 30
CO2: 31
Calcium: 8 — ABNORMAL LOW
Calcium: 8.3 — ABNORMAL LOW
Calcium: 8.6
Chloride: 100
Chloride: 104
Chloride: 99
Creatinine, Ser: 0.59
Creatinine, Ser: 0.74
Creatinine, Ser: 0.77
GFR calc Af Amer: >60
GFR calc Af Amer: >60
GFR calc Af Amer: >60
GFR calc non Af Amer: >60
GFR calc non Af Amer: >60
GFR calc non Af Amer: >60
Glucose, Bld: 134 — ABNORMAL HIGH
Glucose, Bld: 136 — ABNORMAL HIGH
Glucose, Bld: 140 — ABNORMAL HIGH
Potassium: 3.4 — ABNORMAL LOW
Potassium: 3.7
Potassium: 4.3
Sodium: 135
Sodium: 136
Sodium: 138

## 2010-10-09 LAB — PROTIME-INR
INR: 1
INR: 1.1
INR: 1.9 — ABNORMAL HIGH
Prothrombin Time: 13.1
Prothrombin Time: 14.4
Prothrombin Time: 22.4 — ABNORMAL HIGH
Prothrombin Time: 24.6 — ABNORMAL HIGH

## 2010-10-09 LAB — APTT: aPTT: 28

## 2010-10-30 LAB — BASIC METABOLIC PANEL
Calcium: 9.2
Creatinine, Ser: 0.74
GFR calc Af Amer: >60
GFR calc non Af Amer: >60
Glucose, Bld: 109 — ABNORMAL HIGH
Sodium: 141

## 2010-10-30 LAB — PROTIME-INR: Prothrombin Time: 12.8

## 2010-10-30 LAB — HEMOGLOBIN AND HEMATOCRIT, BLOOD: HCT: 42.2

## 2010-12-22 ENCOUNTER — Other Ambulatory Visit: Payer: Self-pay | Admitting: Dermatology

## 2011-03-01 ENCOUNTER — Encounter: Payer: Self-pay | Admitting: Gastroenterology

## 2011-10-12 ENCOUNTER — Other Ambulatory Visit: Payer: Self-pay | Admitting: Internal Medicine

## 2011-10-12 DIAGNOSIS — Z853 Personal history of malignant neoplasm of breast: Secondary | ICD-10-CM

## 2011-10-12 DIAGNOSIS — Z1231 Encounter for screening mammogram for malignant neoplasm of breast: Secondary | ICD-10-CM

## 2011-10-12 DIAGNOSIS — Z9012 Acquired absence of left breast and nipple: Secondary | ICD-10-CM

## 2011-12-06 ENCOUNTER — Ambulatory Visit
Admission: RE | Admit: 2011-12-06 | Discharge: 2011-12-06 | Disposition: A | Discharge status: Home / Self Care | Payer: Medicare PPO | Source: Ambulatory Visit | Attending: Internal Medicine | Admitting: Internal Medicine

## 2011-12-06 DIAGNOSIS — Z1231 Encounter for screening mammogram for malignant neoplasm of breast: Secondary | ICD-10-CM

## 2011-12-06 DIAGNOSIS — Z9012 Acquired absence of left breast and nipple: Secondary | ICD-10-CM

## 2011-12-06 DIAGNOSIS — Z853 Personal history of malignant neoplasm of breast: Secondary | ICD-10-CM

## 2011-12-11 ENCOUNTER — Encounter: Payer: Self-pay | Admitting: Gastroenterology

## 2011-12-11 ENCOUNTER — Ambulatory Visit (INDEPENDENT_AMBULATORY_CARE_PROVIDER_SITE_OTHER): Payer: Medicare PPO | Admitting: Gastroenterology

## 2011-12-11 VITALS — BP 104/70 | HR 65 | Ht 63.0 in | Wt 172.0 lb

## 2011-12-11 DIAGNOSIS — Z860100 Personal history of colon polyps, unspecified: Secondary | ICD-10-CM | POA: Insufficient documentation

## 2011-12-11 NOTE — Progress Notes (Signed)
History of Present Illness: Pleasant 78 year old white female referred for consideration of colonoscopy. She has a remote history of colon polyps. Examinations in 2000 and 2008 were negative for polyps. Her father developed colon cancer in his mid 59s. The patient has no GI complaints including change of bowel habits, abdominal pain, melena or hematochezia.    Past Medical History  Diagnosis Date  . Diverticulosis   . Allergic rhinitis   . Hyperlipidemia    Past Surgical History  Procedure Date  . Total knee arthroplasty     right  . Nasal sinus surgery     2004  . Mastectomy     left  . Replacement total knee     left  . Abdominal hysterectomy    family history includes Breast cancer in her mother; Colon cancer (age of onset:85) in her father; and Pancreatic cancer in her sister. Current Outpatient Prescriptions  Medication Sig Dispense Refill  . amitriptyline (ELAVIL) 25 MG tablet Take 25 mg by mouth at bedtime.      Marland Kitchen aspirin 81 MG tablet Take 81 mg by mouth daily.      . calcium-vitamin D (OSCAL WITH D) 500-200 MG-UNIT per tablet Take 1 tablet by mouth daily.      . cholecalciferol (VITAMIN D) 1000 UNITS tablet Take 1,000 Units by mouth daily.      . fish oil-omega-3 fatty acids 1000 MG capsule Take 2 g by mouth daily.      . meloxicam (MOBIC) 7.5 MG tablet Take 7.5 mg by mouth daily.      . Multiple Vitamin (MULTIVITAMIN) capsule Take 1 capsule by mouth daily.      . raloxifene (EVISTA) 60 MG tablet Take 60 mg by mouth daily.       Allergies as of 12/11/2011  . (No Known Allergies)    reports that she quit smoking about 53 years ago. She has never used smokeless tobacco. She reports that she drinks alcohol. She reports that she does not use illicit drugs.     Review of Systems: Pertinent positive and negative review of systems were noted in the above HPI section. All other review of systems were otherwise negative.  Vital signs were reviewed in today's medical  record Physical Exam: General: Well developed , well nourished, no acute distress Head: Normocephalic and atraumatic Eyes:  sclerae anicteric, EOMI Ears: Normal auditory acuity Mouth: No deformity or lesions Neck: Supple, no masses or thyromegaly Lungs: Clear throughout to auscultation Heart: Regular rate and rhythm; no murmurs, rubs or bruits Abdomen: Soft, non tender and non distended. No masses, hepatosplenomegaly or hernias noted. Normal Bowel sounds Rectal:deferred Musculoskeletal: Symmetrical with no gross deformities  Skin: No lesions on visible extremities Pulses:  Normal pulses noted Extremities: No clubbing, cyanosis, edema or deformities noted Neurological: Alert oriented x 4, grossly nonfocal Cervical Nodes:  No significant cervical adenopathy Inguinal Nodes: No significant inguinal adenopathy Psychological:  Alert and cooperative. Normal mood and affect

## 2011-12-11 NOTE — Assessment & Plan Note (Signed)
In the absence of symptoms and negative colonoscopy in 2008 I don't think routine followup colonoscopy is warranted. Although her father had colon cancer, he did not develop this until his mid 37s.

## 2011-12-11 NOTE — Patient Instructions (Addendum)
No Follow up  neededDiverticulosis Diverticulosis is a common condition that develops when small pouches (diverticula) form in the wall of the colon. The risk of diverticulosis increases with age. It happens more often in people who eat a low-fiber diet. Most individuals with diverticulosis have no symptoms. Those individuals with symptoms usually experience abdominal pain, constipation, or loose stools (diarrhea). HOME CARE INSTRUCTIONS   Increase the amount of fiber in your diet as directed by your caregiver or dietician. This may reduce symptoms of diverticulosis.  Your caregiver may recommend taking a dietary fiber supplement.  Drink at least 6 to 8 glasses of water each day to prevent constipation.  Try not to strain when you have a bowel movement.  Your caregiver may recommend avoiding nuts and seeds to prevent complications, although this is still an uncertain benefit.  Only take over-the-counter or prescription medicines for pain, discomfort, or fever as directed by your caregiver. FOODS WITH HIGH FIBER CONTENT INCLUDE:  Fruits. Apple, peach, pear, tangerine, raisins, prunes.  Vegetables. Brussels sprouts, asparagus, broccoli, cabbage, carrot, cauliflower, romaine lettuce, spinach, summer squash, tomato, winter squash, zucchini.  Starchy Vegetables. Baked beans, kidney beans, lima beans, split peas, lentils, potatoes (with skin).  Grains. Whole wheat bread, brown rice, bran flake cereal, plain oatmeal, white rice, shredded wheat, bran muffins. SEEK IMMEDIATE MEDICAL CARE IF:   You develop increasing pain or severe bloating.  You have an oral temperature above 102 F (38.9 C), not controlled by medicine.  You develop vomiting or bowel movements that are bloody or black. Document Released: 09/29/2003 Document Revised: 03/26/2011 Document Reviewed: 06/01/2009 [facility] Patient Information 2013 [place], [place].

## 2012-06-17 ENCOUNTER — Other Ambulatory Visit: Payer: Self-pay | Admitting: Orthopedic Surgery

## 2012-06-17 DIAGNOSIS — M25512 Pain in left shoulder: Secondary | ICD-10-CM

## 2012-06-18 ENCOUNTER — Other Ambulatory Visit: Payer: Medicare PPO

## 2012-08-14 ENCOUNTER — Telehealth: Payer: Self-pay | Admitting: Gastroenterology

## 2012-08-14 ENCOUNTER — Encounter: Payer: Self-pay | Admitting: Nurse Practitioner

## 2012-08-14 ENCOUNTER — Ambulatory Visit (INDEPENDENT_AMBULATORY_CARE_PROVIDER_SITE_OTHER): Payer: Medicare Other | Admitting: Nurse Practitioner

## 2012-08-14 VITALS — BP 126/74 | HR 80 | Ht 63.0 in | Wt 175.2 lb

## 2012-08-14 DIAGNOSIS — R110 Nausea: Secondary | ICD-10-CM

## 2012-08-14 NOTE — Patient Instructions (Addendum)
Stop taking Mobic until we have completed our evaluation of nausea.   Continue Nexium, once daily 30 minutes before breakfast  Continue Zofran as needed for nausea   Eat small, frequent meals  You have been scheduled for an endoscopy with propofol. Please follow written instructions given to you at your visit today.

## 2012-08-14 NOTE — Progress Notes (Signed)
HPI :   Patient is an 79 year old female known to Dr. Arlyce Dice. She was last seen November 2013 when presenting for evaluation of colon cancer screening. Patient's father had colon cancer in his eighties but given patient's negative colonoscopy in 2008 and her advanced age, repeat colonoscopy was not felt to be necessary.  Patient is worked in today for evaluation of nausea present now for about two weeks. She hasn't started any new medications. No actual vomiting and no abdominal pain. No significant weight loss. No blood in stool. Recent labs at  PCP's office were unremarkable.PCP started her on a PPI and Zofran last week. She has been on Mobic for years. Patient has been under significant stress as her son has been undergoing treat for leukemia.   Past Medical History  Diagnosis Date  . Diverticulosis   . Allergic rhinitis   . Hyperlipidemia     Family History  Problem Relation Age of Onset  . Colon cancer Father 74  . Breast cancer Mother   . Pancreatic cancer Sister    History  Substance Use Topics  . Smoking status: Former Smoker    Quit date: 01/15/1958  . Smokeless tobacco: Never Used  . Alcohol Use: Yes     Comment: bourbon nightly, wine every other night   Current Outpatient Prescriptions  Medication Sig Dispense Refill  . amitriptyline (ELAVIL) 25 MG tablet Take 25 mg by mouth at bedtime.      Marland Kitchen aspirin 81 MG tablet Take 81 mg by mouth daily.      . calcium-vitamin D (OSCAL WITH D) 500-200 MG-UNIT per tablet Take 1 tablet by mouth daily.      . cholecalciferol (VITAMIN D) 1000 UNITS tablet Take 1,000 Units by mouth daily.      . fish oil-omega-3 fatty acids 1000 MG capsule Take 2 g by mouth daily.      . meloxicam (MOBIC) 7.5 MG tablet Take 7.5 mg by mouth daily.      . Multiple Vitamin (MULTIVITAMIN) capsule Take 1 capsule by mouth daily.      . raloxifene (EVISTA) 60 MG tablet Take 60 mg by mouth daily.       No current facility-administered medications for this  visit.   No Known Allergies  ROS: All systems reviewed and negative except for noted in HPI.  Physical Exam: There were no vitals taken for this visit. Constitutional: Pleasant,well-developed, white female in no acute distress. HEENT: Normocephalic and atraumatic. Conjunctivae are normal. No scleral icterus. Neck supple.  Cardiovascular: Normal rate, regular rhythm.  Pulmonary/chest: Effort normal and breath sounds normal. No wheezing, rales or rhonchi. Abdominal: Soft, nondistended, nontender. Bowel sounds active throughout. There are no masses palpable. No hepatomegaly. Extremities: no edema Lymphadenopathy: No cervical adenopathy noted. Neurological: Alert and oriented to person place and time. Skin: Skin is warm and dry. No rashes noted. Psychiatric: Normal mood and affect. Behavior is normal.   ASSESSMENT AND PLAN:   79 year old female with a two week history of nausea of unclear etiology. EGD is reasonable to exclude PUD in setting of longstanding NSAID use. The benefits, risks, and potential complications of EGD with possible biopsies were discussed with the patient and she agrees to proceed.  If negative and symptoms persist then consider gastric emptying study.

## 2012-08-14 NOTE — Telephone Encounter (Signed)
Pt states she has been nauseated for several days, she does not actually vomit. Pt has decreased appetite and headache also. Pt requesting to be seen. Pt scheduled to see Willette Cluster NP today at 11am. Pt aware of appt.

## 2012-08-15 ENCOUNTER — Encounter (HOSPITAL_COMMUNITY)
Admission: RE | Disposition: A | Discharge status: Home / Self Care | Payer: Self-pay | Source: Ambulatory Visit | Attending: Gastroenterology

## 2012-08-15 ENCOUNTER — Encounter (HOSPITAL_COMMUNITY): Payer: Self-pay | Admitting: *Deleted

## 2012-08-15 ENCOUNTER — Ambulatory Visit (HOSPITAL_COMMUNITY)
Admission: RE | Admit: 2012-08-15 | Discharge: 2012-08-15 | Disposition: A | Discharge status: Home / Self Care | Payer: Medicare Other | Source: Ambulatory Visit | Attending: Gastroenterology | Admitting: Gastroenterology

## 2012-08-15 ENCOUNTER — Encounter: Payer: Self-pay | Admitting: Nurse Practitioner

## 2012-08-15 DIAGNOSIS — Z803 Family history of malignant neoplasm of breast: Secondary | ICD-10-CM | POA: Insufficient documentation

## 2012-08-15 DIAGNOSIS — K269 Duodenal ulcer, unspecified as acute or chronic, without hemorrhage or perforation: Secondary | ICD-10-CM

## 2012-08-15 DIAGNOSIS — J309 Allergic rhinitis, unspecified: Secondary | ICD-10-CM | POA: Insufficient documentation

## 2012-08-15 DIAGNOSIS — Z87891 Personal history of nicotine dependence: Secondary | ICD-10-CM | POA: Insufficient documentation

## 2012-08-15 DIAGNOSIS — K5730 Diverticulosis of large intestine without perforation or abscess without bleeding: Secondary | ICD-10-CM | POA: Insufficient documentation

## 2012-08-15 DIAGNOSIS — E785 Hyperlipidemia, unspecified: Secondary | ICD-10-CM | POA: Insufficient documentation

## 2012-08-15 DIAGNOSIS — Z791 Long term (current) use of non-steroidal anti-inflammatories (NSAID): Secondary | ICD-10-CM | POA: Insufficient documentation

## 2012-08-15 DIAGNOSIS — Z7982 Long term (current) use of aspirin: Secondary | ICD-10-CM | POA: Insufficient documentation

## 2012-08-15 DIAGNOSIS — K2980 Duodenitis without bleeding: Secondary | ICD-10-CM | POA: Insufficient documentation

## 2012-08-15 DIAGNOSIS — R110 Nausea: Secondary | ICD-10-CM | POA: Insufficient documentation

## 2012-08-15 DIAGNOSIS — Z79899 Other long term (current) drug therapy: Secondary | ICD-10-CM | POA: Insufficient documentation

## 2012-08-15 DIAGNOSIS — Z800 Family history of malignant neoplasm of digestive organs: Secondary | ICD-10-CM | POA: Insufficient documentation

## 2012-08-15 DIAGNOSIS — K263 Acute duodenal ulcer without hemorrhage or perforation: Secondary | ICD-10-CM | POA: Insufficient documentation

## 2012-08-15 HISTORY — DX: Headache: R51

## 2012-08-15 HISTORY — PX: ESOPHAGOGASTRODUODENOSCOPY: SHX5428

## 2012-08-15 HISTORY — DX: Unspecified osteoarthritis, unspecified site: M19.90

## 2012-08-15 SURGERY — EGD (ESOPHAGOGASTRODUODENOSCOPY)
Anesthesia: Moderate Sedation

## 2012-08-15 MED ORDER — FENTANYL CITRATE 0.05 MG/ML IJ SOLN
INTRAMUSCULAR | Status: AC
  Filled 2012-08-15: qty 4

## 2012-08-15 MED ORDER — BUTAMBEN-TETRACAINE-BENZOCAINE 2-2-14 % EX AERO
INHALATION_SPRAY | CUTANEOUS | Status: DC | PRN
  Administered 2012-08-15: 2 via TOPICAL

## 2012-08-15 MED ORDER — DIPHENHYDRAMINE HCL 50 MG/ML IJ SOLN
INTRAMUSCULAR | Status: AC
  Filled 2012-08-15: qty 1

## 2012-08-15 MED ORDER — MIDAZOLAM HCL 10 MG/2ML IJ SOLN
INTRAMUSCULAR | Status: AC
  Filled 2012-08-15: qty 4

## 2012-08-15 MED ORDER — ESOMEPRAZOLE MAGNESIUM 40 MG PO CPDR: 40.0000 mg | DELAYED_RELEASE_CAPSULE | Freq: Every day | ORAL | Status: DC

## 2012-08-15 MED ORDER — FENTANYL CITRATE 0.05 MG/ML IJ SOLN
INTRAMUSCULAR | Status: DC | PRN
  Administered 2012-08-15 (×2): 25 ug via INTRAVENOUS

## 2012-08-15 MED ORDER — MIDAZOLAM HCL 10 MG/2ML IJ SOLN
INTRAMUSCULAR | Status: DC | PRN
  Administered 2012-08-15: 2 mg via INTRAVENOUS
  Administered 2012-08-15: 1 mg via INTRAVENOUS

## 2012-08-15 MED ORDER — SODIUM CHLORIDE 0.9 % IV SOLN
INTRAVENOUS | Status: DC
  Administered 2012-08-15: 12:00:00 via INTRAVENOUS

## 2012-08-15 NOTE — H&P (View-Only) (Signed)
HPI :   Patient is an 79-year-old female known to Dr. Kaplan. She was last seen November 2013 when presenting for evaluation of colon cancer screening. Patient's father had colon cancer in his eighties but given patient's negative colonoscopy in 2008 and her advanced age, repeat colonoscopy was not felt to be necessary.  Patient is worked in today for evaluation of nausea present now for about two weeks. She hasn't started any new medications. No actual vomiting and no abdominal pain. No significant weight loss. No blood in stool. Recent labs at  PCP's office were unremarkable.PCP started her on a PPI and Zofran last week. She has been on Mobic for years. Patient has been under significant stress as her son has been undergoing treat for leukemia.   Past Medical History  Diagnosis Date  . Diverticulosis   . Allergic rhinitis   . Hyperlipidemia     Family History  Problem Relation Age of Onset  . Colon cancer Father 85  . Breast cancer Mother   . Pancreatic cancer Sister    History  Substance Use Topics  . Smoking status: Former Smoker    Quit date: 01/15/1958  . Smokeless tobacco: Never Used  . Alcohol Use: Yes     Comment: bourbon nightly, wine every other night   Current Outpatient Prescriptions  Medication Sig Dispense Refill  . amitriptyline (ELAVIL) 25 MG tablet Take 25 mg by mouth at bedtime.      . aspirin 81 MG tablet Take 81 mg by mouth daily.      . calcium-vitamin D (OSCAL WITH D) 500-200 MG-UNIT per tablet Take 1 tablet by mouth daily.      . cholecalciferol (VITAMIN D) 1000 UNITS tablet Take 1,000 Units by mouth daily.      . fish oil-omega-3 fatty acids 1000 MG capsule Take 2 g by mouth daily.      . meloxicam (MOBIC) 7.5 MG tablet Take 7.5 mg by mouth daily.      . Multiple Vitamin (MULTIVITAMIN) capsule Take 1 capsule by mouth daily.      . raloxifene (EVISTA) 60 MG tablet Take 60 mg by mouth daily.       No current facility-administered medications for this  visit.   No Known Allergies  ROS: All systems reviewed and negative except for noted in HPI.  Physical Exam: There were no vitals taken for this visit. Constitutional: Pleasant,well-developed, white female in no acute distress. HEENT: Normocephalic and atraumatic. Conjunctivae are normal. No scleral icterus. Neck supple.  Cardiovascular: Normal rate, regular rhythm.  Pulmonary/chest: Effort normal and breath sounds normal. No wheezing, rales or rhonchi. Abdominal: Soft, nondistended, nontender. Bowel sounds active throughout. There are no masses palpable. No hepatomegaly. Extremities: no edema Lymphadenopathy: No cervical adenopathy noted. Neurological: Alert and oriented to person place and time. Skin: Skin is warm and dry. No rashes noted. Psychiatric: Normal mood and affect. Behavior is normal.   ASSESSMENT AND PLAN:   79 year old female with a two week history of nausea of unclear etiology. EGD is reasonable to exclude PUD in setting of longstanding NSAID use. The benefits, risks, and potential complications of EGD with possible biopsies were discussed with the patient and she agrees to proceed.  If negative and symptoms persist then consider gastric emptying study.      

## 2012-08-15 NOTE — Progress Notes (Signed)
Reviewed and agree with management. Robert D. Kaplan, M.D., FACG  

## 2012-08-15 NOTE — Interval H&P Note (Signed)
History and Physical Interval Note:  08/15/2012 12:38 PM  [name]  has presented today for surgery, with the diagnosis of Nausea [787.02]  The various methods of treatment have been discussed with the patient and family. After consideration of risks, benefits and other options for treatment, the patient has consented to  Procedure(s): ESOPHAGOGASTRODUODENOSCOPY (EGD) (N/A) as a surgical intervention .  The patient's history has been reviewed, patient examined, no change in status, stable for surgery.  I have reviewed the patient's chart and labs.  Questions were answered to the patient's satisfaction.    The recent H&P (dated *08/14/12**) was reviewed, the patient was examined and there is no change in the patients condition since that H&P was completed.   [name]  08/15/2012, 12:38 PM '   [name]

## 2012-08-18 ENCOUNTER — Encounter (HOSPITAL_COMMUNITY): Payer: Self-pay | Admitting: Gastroenterology

## 2012-08-18 NOTE — Op Note (Signed)
[facility] [address] [place], [address]   ENDOSCOPY PROCEDURE REPORT  PATIENT: [name], [name]  MR#: [id number] BIRTHDATE: [birth year] , 82  yrs. old GENDER: Female ENDOSCOPIST: Louis Meckel, MD REFERRED BY: PROCEDURE DATE:  08/15/2012 PROCEDURE:  EGD w/ biopsy ASA CLASS:     Class II INDICATIONS:  Nausea. MEDICATIONS: These medications were titrated to patient response per physician's verbal order, Versed 3 mg IV, and Fentanyl 50 mcg IV TOPICAL ANESTHETIC: Cetacaine Spray  DESCRIPTION OF PROCEDURE: After the risks benefits and alternatives of the procedure were thoroughly explained, informed consent was obtained.  The    endoscope was introduced through the mouth and advanced to the third portion of the duodenum. Without limitations. The instrument was slowly withdrawn as the mucosa was fully examined.      In the duodenal bulb there was a 3-42mm clean based ulcer. Surrounding mucosa was erythematous forming a circular area of inflammation but not obstruction.  Bxs were taken.   The remainder of the upper endoscopy exam was otherwise normal.  Retroflexed views revealed no abnormalities.     The scope was then withdrawn from the patient and the procedure completed.  COMPLICATIONS: There were no complications. ENDOSCOPIC IMPRESSION: 1.   Acute duodenal ulcer  RECOMMENDATIONS: 1. avoid NSAIDS 2.  continue nexium 3.  OV 2-3 weeks REPEAT EXAM:  eSigned:  Louis Meckel, MD 08/15/2012 12:54 PM   CC:

## 2012-08-20 ENCOUNTER — Other Ambulatory Visit: Payer: Self-pay

## 2012-08-21 ENCOUNTER — Encounter: Payer: Self-pay | Admitting: Nurse Practitioner

## 2012-08-25 ENCOUNTER — Encounter: Payer: Self-pay | Admitting: Gastroenterology

## 2012-08-25 ENCOUNTER — Telehealth: Payer: Self-pay | Admitting: Nurse Practitioner

## 2012-08-25 NOTE — Telephone Encounter (Signed)
Called patient Saturday 08/23/12 in response to her email regarding what to take for arthritis. We d/ced Mobic because of PUD. Recommended Tylenol arthritis.

## 2012-08-27 ENCOUNTER — Telehealth: Payer: Self-pay | Admitting: Nurse Practitioner

## 2012-08-27 NOTE — Telephone Encounter (Signed)
Spoke with patient and she states Willette Cluster, NP called her over the weekend and the phone connection was bad.  Patient states she stopped the Mobic and has been taking extra strength Tylenol. Gave patient Willette Cluster, NP recommendation as per her phone note of Tylenol arthritis.

## 2012-09-11 ENCOUNTER — Encounter: Payer: Self-pay | Admitting: Nurse Practitioner

## 2012-09-11 ENCOUNTER — Other Ambulatory Visit: Payer: Self-pay | Admitting: Gastroenterology

## 2012-09-11 MED ORDER — DICLOFENAC-MISOPROSTOL 50-0.2 MG PO TBEC: 1.0000 | DELAYED_RELEASE_TABLET | Freq: Four times a day (QID) | ORAL | Status: DC

## 2012-09-11 MED ORDER — MISOPROSTOL 100 MCG PO TABS: 100.0000 ug | ORAL_TABLET | Freq: Four times a day (QID) | ORAL | Status: DC

## 2012-09-16 ENCOUNTER — Ambulatory Visit (INDEPENDENT_AMBULATORY_CARE_PROVIDER_SITE_OTHER): Payer: Medicare Other | Admitting: Gastroenterology

## 2012-09-16 ENCOUNTER — Encounter: Payer: Self-pay | Admitting: Gastroenterology

## 2012-09-16 VITALS — BP 130/70 | HR 65 | Ht 63.0 in | Wt 171.0 lb

## 2012-09-16 DIAGNOSIS — R110 Nausea: Secondary | ICD-10-CM

## 2012-09-16 DIAGNOSIS — K269 Duodenal ulcer, unspecified as acute or chronic, without hemorrhage or perforation: Secondary | ICD-10-CM

## 2012-09-16 NOTE — Progress Notes (Signed)
History of Present Illness:  [name] has returned following upper endoscopy that demonstrated an active duodenal ulcer.  NSAIDs have been discontinued.  She's taking Tylenol for joint pains.  I prescribed Arthrotec but she has not yet started this medicine.  Nausea has entirely subsided.    Review of Systems: Pertinent positive and negative review of systems were noted in the above HPI section. All other review of systems were otherwise negative.    Current Medications, Allergies, Past Medical History, Past Surgical History, Family History and Social History were reviewed in [employer] electronic medical record  Vital signs were reviewed in today's medical record. Physical Exam: General: Well developed , well nourished, no acute distress

## 2012-09-16 NOTE — Assessment & Plan Note (Signed)
Secondary to NSAID-related duodenal ulcer.  Recommendations #1 patient will try Arthrotec.  If she tolerates this but continues to have joint pains I will try a higher dose.  She can take tramadol as needed

## 2012-09-16 NOTE — Assessment & Plan Note (Signed)
Probable NSAID-related ulcer.  Plan to continue Nexium and begin Arthrotec

## 2012-09-16 NOTE — Patient Instructions (Addendum)
Follow up as needed

## 2012-09-18 ENCOUNTER — Inpatient Hospital Stay
Admission: RE | Admit: 2012-09-18 | Discharge: 2012-09-18 | Disposition: A | Discharge status: Home / Self Care | Payer: Medicare Other | Source: Ambulatory Visit | Attending: Orthopedic Surgery | Admitting: Orthopedic Surgery

## 2012-10-05 ENCOUNTER — Encounter: Payer: Self-pay | Admitting: Nurse Practitioner

## 2012-10-10 ENCOUNTER — Telehealth: Payer: Self-pay | Admitting: *Deleted

## 2012-10-10 MED ORDER — ESOMEPRAZOLE MAGNESIUM 40 MG PO CPDR: 40.0000 mg | DELAYED_RELEASE_CAPSULE | Freq: Every day | ORAL | Status: DC

## 2012-10-10 NOTE — Telephone Encounter (Signed)
Pt advise request. Patient wanted 90 days of Nexium sent to [facility]. Sent to pharmacy and responded to pt

## 2012-11-20 ENCOUNTER — Other Ambulatory Visit: Payer: Self-pay

## 2012-12-01 ENCOUNTER — Telehealth: Payer: Self-pay | Admitting: Gastroenterology

## 2012-12-01 ENCOUNTER — Telehealth: Payer: Self-pay | Admitting: *Deleted

## 2012-12-01 NOTE — Telephone Encounter (Signed)
Pt would like to switch care from [facility] to [facility], states her husband sees Dr. Marina Goodell. Will you approve the switch? Please advise.

## 2012-12-01 NOTE — Telephone Encounter (Signed)
yes

## 2012-12-01 NOTE — Telephone Encounter (Signed)
Copy sent to patient. Called patient.

## 2012-12-01 NOTE — Telephone Encounter (Signed)
Per pt she would like to switch care from Dr. Arlyce Dice to Dr. Marina Goodell. She was a former Dr. Victorino Dike patient, and her husband was assigned to Dr. Marina Goodell. They would like to keep the same doctor.

## 2012-12-01 NOTE — Telephone Encounter (Signed)
Spoke with patient and she has already taken care of her questions.

## 2012-12-01 NOTE — Telephone Encounter (Signed)
Pt would like to switch to Dr. Marina Goodell. States her husband sees Dr. Marina Goodell. Will you accept this pt? Please advise.

## 2012-12-02 NOTE — Telephone Encounter (Signed)
Toniann Fail you can schedule this pt with Dr. Marina Goodell, see notes below.

## 2012-12-02 NOTE — Telephone Encounter (Signed)
Sure

## 2012-12-05 ENCOUNTER — Encounter: Payer: Self-pay | Admitting: Internal Medicine

## 2012-12-05 NOTE — Telephone Encounter (Signed)
Left a message to call back and schedule with Dr. Marina Goodell.

## 2012-12-14 ENCOUNTER — Encounter: Payer: Self-pay | Admitting: Nurse Practitioner

## 2013-01-13 ENCOUNTER — Encounter: Payer: Self-pay | Admitting: Internal Medicine

## 2013-01-13 ENCOUNTER — Ambulatory Visit (INDEPENDENT_AMBULATORY_CARE_PROVIDER_SITE_OTHER): Payer: Medicare Other | Admitting: Internal Medicine

## 2013-01-13 VITALS — BP 126/78 | HR 84 | Ht 63.0 in | Wt 170.1 lb

## 2013-01-13 DIAGNOSIS — K5731 Diverticulosis of large intestine without perforation or abscess with bleeding: Secondary | ICD-10-CM

## 2013-01-13 DIAGNOSIS — Z86010 Personal history of colonic polyps: Secondary | ICD-10-CM

## 2013-01-13 DIAGNOSIS — M129 Arthropathy, unspecified: Secondary | ICD-10-CM

## 2013-01-13 DIAGNOSIS — K269 Duodenal ulcer, unspecified as acute or chronic, without hemorrhage or perforation: Secondary | ICD-10-CM

## 2013-01-13 DIAGNOSIS — K5900 Constipation, unspecified: Secondary | ICD-10-CM

## 2013-01-13 DIAGNOSIS — M1990 Unspecified osteoarthritis, unspecified site: Secondary | ICD-10-CM

## 2013-01-13 MED ORDER — PSYLLIUM 58.6 % PO POWD: ORAL | Status: DC

## 2013-01-13 NOTE — Patient Instructions (Signed)
Per Dr. Marina Goodell, use Metamucil daily  Carbonated beverages are ok to induce belching  Check on a generic substitute for Nexium and let us know.  Please check with Dr. Felipa Eth regarding your arthritis questions

## 2013-01-13 NOTE — Progress Notes (Signed)
HISTORY OF PRESENT ILLNESS:  [name] is a 79 y.o. female with past medical history as listed below. She presents today, with her husband, after requesting transfer GI care to myself. She comes with a multitude of issues and a type page regarding chief complaints. First, she was diagnosed with NSAID induced duodenal ulcer by Dr. Arlyce Dice August 2014. Subsequently taken off NSAIDs and continued on Nexium. For severe arthritic pain she has been using tramadol. This works okay. Some breakthrough symptoms. Had been prescribed Arthrotec Dr. Arlyce Dice. Not using. Has prescription. Wonders which he can use for arthritic pain. Next, problems with upper abdominal discomfort over the past 6 months predictably relieved with belching after consuming several swallows of carbonated soda drink. She does describe significant stress related to her son's illness, and correlates this directly with symptoms. Her husband is concerned that she is exposing herself to toxic chemicals from the sodas. Next, they inquire about an alternative PPI for cost reasons. Finally, she wonders about the need for screening colonoscopy. Parent with colon cancer in their 54s. Remote history of polyps (type unknown). However, complete colonoscopy April 2008 was normal except for diverticulosis. Previously advised routine colonoscopy not necessary. She does have chronic constipation and hard ball like stool.no additional GI complaints at this time   REVIEW OF SYSTEMS:  All non-GI ROS negative except for Anxiety, arthritis   Past Medical History  Diagnosis Date  . Diverticulosis   . Allergic rhinitis   . Hyperlipidemia   . Headache(784.0)     sinus HAs  . Breast cancer     left breast cancer  . Arthritis     Past Surgical History  Procedure Laterality Date  . Total knee arthroplasty      right  . Nasal sinus surgery      2004  . Replacement total knee      left  . Abdominal hysterectomy    . Mastectomy      left  .  Shoulder surgery Bilateral     repairs  . Esophagogastroduodenoscopy N/A 08/15/2012    Procedure: ESOPHAGOGASTRODUODENOSCOPY (EGD);  Surgeon: Louis Meckel, MD;  Location: [facility];  Service: Endoscopy;  Laterality: N/A;    Social History MOZELLA REXRODE  reports that she quit smoking about 55 years ago. She has never used smokeless tobacco. She reports that she drinks alcohol. She reports that she does not use illicit drugs.  family history includes Breast cancer in her mother; Colon cancer (age of onset: 93) in her father; Pancreatic cancer in her sister.  No Known Allergies     PHYSICAL EXAMINATION: Vital signs: BP 126/78  Pulse 84  Ht 5\' 3"  (1.6 m)  Wt 170 lb 2 oz (77.168 kg)  BMI 30.14 kg/m2 General: Well-developed, well-nourished, no acute distress HEENT: Sclerae are anicteric, conjunctiva pink. Oral mucosa intact Lungs: Clear Heart: Regular Abdomen: soft, nontender, nondistended, no obvious ascites, no peritoneal signs, normal bowel sounds. No organomegaly. Extremities: No edema Psychiatric: anxious.  alert and oriented x3. Cooperative    ASSESSMENT:  #1. History of NSAID induced duodenal ulcer. #2. Arthritis. Suboptimally controlled with current pain regimen #3. Functional dyspepsia. Increased intestinal gas. Symptoms improved with carbonated beverages and belching #4. Functional constipation #5. Normal colonoscopy 2008. No active symptoms of relevance   PLAN:  #1. Okay to try, from GI standpoint, previously prescribed Arthrotec. #2. See her PCP regarding your arthritis #3. Continue daily PPI for GI mucosal protection #4. Find out the most cost effective version, and  we are happy to prescribed #5. Daily fiber supplementation with Metamucil #6. No indication for routine screening colonoscopy #7. Return to the care of PCP. GI followup when necessary  Spent nearly one hour with the patient and her husband. Mostly regarding counseling

## 2013-01-15 DIAGNOSIS — S060XAA Concussion with loss of consciousness status unknown, initial encounter: Secondary | ICD-10-CM

## 2013-01-15 DIAGNOSIS — S060X9A Concussion with loss of consciousness of unspecified duration, initial encounter: Secondary | ICD-10-CM

## 2013-01-15 HISTORY — DX: Concussion with loss of consciousness of unspecified duration, initial encounter: S06.0X9A

## 2013-01-15 HISTORY — DX: Concussion with loss of consciousness status unknown, initial encounter: S06.0XAA

## 2013-01-26 ENCOUNTER — Other Ambulatory Visit: Payer: Self-pay

## 2013-01-26 ENCOUNTER — Telehealth: Payer: Self-pay | Admitting: *Deleted

## 2013-01-26 DIAGNOSIS — Z9012 Acquired absence of left breast and nipple: Secondary | ICD-10-CM

## 2013-01-26 DIAGNOSIS — Z1231 Encounter for screening mammogram for malignant neoplasm of breast: Secondary | ICD-10-CM

## 2013-01-26 NOTE — Telephone Encounter (Signed)
Spoke with patient and gave her Dr. Blanch Media recommendation to see PCP or her arthritis MD for further suggestions.

## 2013-01-26 NOTE — Telephone Encounter (Signed)
Left a message for patient to call me. 

## 2013-02-09 ENCOUNTER — Ambulatory Visit: Payer: Medicare Other

## 2013-03-01 ENCOUNTER — Encounter: Payer: Self-pay | Admitting: Nurse Practitioner

## 2013-03-02 ENCOUNTER — Telehealth: Payer: Self-pay | Admitting: *Deleted

## 2013-03-02 NOTE — Telephone Encounter (Signed)
Dr. Henrene Pastor - When my stomach ulcer was identified last August, I began taking 40 mg Nexium each day. Since that time I have never experienced any stomach discomfort. For the past month I have been using 22.3 mg Nexium, and I continue to have no stomach problem. May I stop taking it now? Thank you, [name] 05/12/1929

## 2013-03-02 NOTE — Telephone Encounter (Signed)
Should continue to protect against recurrent ulcer disease

## 2013-03-02 NOTE — Telephone Encounter (Signed)
Spoke with patient and gave her Dr. Perry's recommendation. 

## 2013-03-02 NOTE — Telephone Encounter (Signed)
Left a message for patient to call me. 

## 2013-03-23 ENCOUNTER — Ambulatory Visit
Admission: RE | Admit: 2013-03-23 | Discharge: 2013-03-23 | Disposition: A | Discharge status: Home / Self Care | Payer: Medicare Other | Source: Ambulatory Visit

## 2013-03-23 ENCOUNTER — Ambulatory Visit: Payer: Medicare Other

## 2013-03-23 DIAGNOSIS — Z1231 Encounter for screening mammogram for malignant neoplasm of breast: Secondary | ICD-10-CM

## 2013-03-23 DIAGNOSIS — Z9012 Acquired absence of left breast and nipple: Secondary | ICD-10-CM

## 2013-03-25 ENCOUNTER — Ambulatory Visit: Payer: Medicare Other

## 2013-09-10 ENCOUNTER — Encounter (HOSPITAL_COMMUNITY): Payer: Self-pay | Admitting: Radiology

## 2013-09-10 ENCOUNTER — Emergency Department (HOSPITAL_COMMUNITY)
Admission: EM | Admit: 2013-09-10 | Discharge: 2013-09-10 | Disposition: A | Discharge status: Home / Self Care | Payer: Medicare Other | Attending: Emergency Medicine | Admitting: Emergency Medicine

## 2013-09-10 ENCOUNTER — Emergency Department (HOSPITAL_COMMUNITY): Payer: Medicare Other

## 2013-09-10 DIAGNOSIS — M129 Arthropathy, unspecified: Secondary | ICD-10-CM | POA: Diagnosis not present

## 2013-09-10 DIAGNOSIS — Z853 Personal history of malignant neoplasm of breast: Secondary | ICD-10-CM | POA: Insufficient documentation

## 2013-09-10 DIAGNOSIS — Z7982 Long term (current) use of aspirin: Secondary | ICD-10-CM | POA: Insufficient documentation

## 2013-09-10 DIAGNOSIS — Z87891 Personal history of nicotine dependence: Secondary | ICD-10-CM | POA: Insufficient documentation

## 2013-09-10 DIAGNOSIS — R42 Dizziness and giddiness: Secondary | ICD-10-CM

## 2013-09-10 DIAGNOSIS — Z79899 Other long term (current) drug therapy: Secondary | ICD-10-CM | POA: Diagnosis not present

## 2013-09-10 DIAGNOSIS — Z862 Personal history of diseases of the blood and blood-forming organs and certain disorders involving the immune mechanism: Secondary | ICD-10-CM | POA: Diagnosis not present

## 2013-09-10 DIAGNOSIS — Z8639 Personal history of other endocrine, nutritional and metabolic disease: Secondary | ICD-10-CM | POA: Insufficient documentation

## 2013-09-10 LAB — CBC WITH DIFFERENTIAL/PLATELET
Basophils Absolute: 0 10*3/uL (ref 0.0–0.1)
Basophils Relative: 1 % (ref 0–1)
Eosinophils Absolute: 0.2 10*3/uL (ref 0.0–0.7)
Eosinophils Relative: 2 % (ref 0–5)
HCT: 43.2 % (ref 36.0–46.0)
Hemoglobin: 14.3 g/dL (ref 12.0–15.0)
Lymphocytes Relative: 28 % (ref 12–46)
Lymphs Abs: 1.9 10*3/uL (ref 0.7–4.0)
MCH: 30.2 pg (ref 26.0–34.0)
MCHC: 33.1 g/dL (ref 30.0–36.0)
MCV: 91.3 fL (ref 78.0–100.0)
Monocytes Absolute: 0.8 10*3/uL (ref 0.1–1.0)
Monocytes Relative: 12 % (ref 3–12)
Neutro Abs: 3.9 10*3/uL (ref 1.7–7.7)
Neutrophils Relative %: 57 % (ref 43–77)
Platelets: 290 10*3/uL (ref 150–400)
RBC: 4.73 MIL/uL (ref 3.87–5.11)
RDW: 14.6 % (ref 11.5–15.5)
WBC: 6.8 10*3/uL (ref 4.0–10.5)

## 2013-09-10 LAB — URINE MICROSCOPIC-ADD ON

## 2013-09-10 LAB — COMPREHENSIVE METABOLIC PANEL
ALT: 22 U/L (ref 0–35)
AST: 27 U/L (ref 0–37)
Albumin: 3.6 g/dL (ref 3.5–5.2)
Alkaline Phosphatase: 74 U/L (ref 39–117)
Anion gap: 15 (ref 5–15)
BUN: 15 mg/dL (ref 6–23)
CO2: 26 mEq/L (ref 19–32)
Calcium: 9 mg/dL (ref 8.4–10.5)
Chloride: 101 mEq/L (ref 96–112)
Creatinine, Ser: 0.7 mg/dL (ref 0.50–1.10)
GFR calc Af Amer: >90 mL/min (ref >90)
GFR calc non Af Amer: 79 mL/min — ABNORMAL LOW (ref >90)
Glucose, Bld: 91 mg/dL (ref 70–99)
Potassium: 4.5 mEq/L (ref 3.7–5.3)
Sodium: 142 mEq/L (ref 137–147)
Total Bilirubin: 0.4 mg/dL (ref 0.3–1.2)
Total Protein: 7.1 g/dL (ref 6.0–8.3)

## 2013-09-10 LAB — URINALYSIS, ROUTINE W REFLEX MICROSCOPIC
Bilirubin Urine: NEGATIVE
Glucose, UA: NEGATIVE mg/dL
Ketones, ur: NEGATIVE mg/dL
Nitrite: NEGATIVE
Protein, ur: NEGATIVE mg/dL
Specific Gravity, Urine: 1.011 (ref 1.005–1.030)
Urobilinogen, UA: 0.2 mg/dL (ref 0.0–1.0)
pH: 6 (ref 5.0–8.0)

## 2013-09-10 MED ORDER — MECLIZINE HCL 25 MG PO TABS
25.0000 mg | ORAL_TABLET | Freq: Once | ORAL | Status: AC
  Administered 2013-09-10: 25 mg via ORAL
  Filled 2013-09-10: qty 1

## 2013-09-10 MED ORDER — MECLIZINE HCL 25 MG PO TABS: 25.0000 mg | ORAL_TABLET | Freq: Three times a day (TID) | ORAL | Status: DC | PRN

## 2013-09-10 MED ORDER — IOHEXOL 350 MG/ML SOLN
80.0000 mL | Freq: Once | INTRAVENOUS | Status: AC | PRN
  Administered 2013-09-10: 80 mL via INTRAVENOUS

## 2013-09-10 NOTE — Discharge Instructions (Signed)
For CT scans did not show any abnormalities.  Followup with your doctor, and to the ENT that is provided

## 2013-09-10 NOTE — ED Provider Notes (Signed)
Medical screening examination/treatment/procedure(s) were conducted as a shared visit with non-physician practitioner(s) and myself.  I personally evaluated the patient during the encounter.   EKG Interpretation   Date/Time:  Thursday September 10 2013 06:58:22 EDT Ventricular Rate:  60 PR Interval:  172 QRS Duration: 91 QT Interval:  439 QTC Calculation: 439 R Axis:   -9 Text Interpretation:  Sinus rhythm Low voltage, precordial leads Consider  anterior infarct No significant change was found Confirmed by [facility]  MD,  TREY ([id number]) on 09/10/2013 4:42:47 PM        Jenny Reichmann Elba Barman III, MD 09/10/13 [phone]

## 2013-09-10 NOTE — ED Notes (Signed)
Pt placed on monitor upon return to room from radiology.Pt continues to be monitored by 12 lead, blood pressure, and pulse ox.

## 2013-09-10 NOTE — ED Notes (Signed)
Pt provided a [place] Sandwich and a Caffine Free Diet Coke.

## 2013-09-10 NOTE — ED Notes (Signed)
Pt continues to be monitored by blood pressure, 12 lead, and pulse ox. Pts family remains at bedside.

## 2013-09-10 NOTE — ED Notes (Signed)
Pt was assisted using bedside commode. Using minium assistance from staff. Pt remains on monitor.

## 2013-09-10 NOTE — ED Provider Notes (Signed)
Medical screening examination/treatment/procedure(s) were conducted as a shared visit with non-physician practitioner(s) and myself.  I personally evaluated the patient during the encounter.   EKG Interpretation   Date/Time:  Thursday September 10 2013 06:58:22 EDT Ventricular Rate:  60 PR Interval:  172 QRS Duration: 91 QT Interval:  439 QTC Calculation: 439 R Axis:   -9 Text Interpretation:  Sinus rhythm Low voltage, precordial leads Consider  anterior infarct No significant change was found Confirmed by [facility]  MD,  TREY ([id number]) on 09/10/2013 4:42:47 PM      80 yo female with complaint of vertigo.  She has had similar symptoms in the past.  She had a brief but intense episode yesterday morning.  This morning, symptoms return and have been constant.  On exam, well appearing, nontoxic, not distressed, normal respiratory effort, normal perfusion, appears dizzy with position changes.  Meclizine in ED did not improve symptoms.  Pt unfortunately cannot have an MRI.  Neurology was consulted and recommended CTA head and neck, which was negative.  Discharged home for outpatient followup.    Clinical Impression: 1. Vertigo   2. Dizziness       Houston Siren III, MD 09/10/13 [phone]

## 2013-09-10 NOTE — ED Notes (Signed)
Pt laid flat to reposition and re-placed in sitting position.  In doing so, pt felt like she was falling off the bed.  Feeling passed in 1 minute.

## 2013-09-10 NOTE — Consult Note (Addendum)
Referring Physician: Doy Mince    Chief Complaint: Vertigo  HPI: [name] is an 80 y.o. female who reports that she has had bouts of vertigo in the past.  Yesterday morning awakened and had a severe bout of vertigo lasting a few minutes.  It resolved and she was able to go about her activities of the day.  She took 2 Meclizine last night even though she was asymptomatic.  This morning again awakened at about 5AM with nausea and recurrent vertigo symptoms.  They have not resolved and she presented for evaluation.  As long as she sits up still she is asymptomatic but with movement she improves.   Patient's son died about 3 weeks ago.  Since that time she has been taking Ambien to sleep.  She is also on Pamelor and Elavil.  The Pamelior was added for stress.     Date last known well: Date: 09/10/2013 Time last known well: Time: 22:00 tPA Given: No: Outside time window  Past Medical History  Diagnosis Date  . Diverticulosis   . Allergic rhinitis   . Hyperlipidemia   . Headache(784.0)     sinus HAs  . Breast cancer     left breast cancer  . Arthritis     Past Surgical History  Procedure Laterality Date  . Total knee arthroplasty      right  . Nasal sinus surgery      2004  . Replacement total knee      left  . Abdominal hysterectomy    . Mastectomy      left  . Shoulder surgery Bilateral     repairs  . Esophagogastroduodenoscopy N/A 08/15/2012    Procedure: ESOPHAGOGASTRODUODENOSCOPY (EGD);  Surgeon: Inda Castle, MD;  Location: [facility];  Service: Endoscopy;  Laterality: N/A;    Family History  Problem Relation Age of Onset  . Colon cancer Father 103  . Breast cancer Mother   . Pancreatic cancer Sister    Social History:  reports that she quit smoking about 55 years ago. She has never used smokeless tobacco. She reports that she drinks alcohol. She reports that she does not use illicit drugs.  Allergies: No Known Allergies  Medications: I have reviewed the  patient's current medications. Prior to Admission:  Current outpatient prescriptions:amitriptyline (ELAVIL) 25 MG tablet, Take 25 mg by mouth at bedtime., Disp: , Rfl: ;  aspirin 81 MG tablet, Take 81 mg by mouth daily., Disp: , Rfl: ;  calcium-vitamin D (OSCAL WITH D) 500-200 MG-UNIT per tablet, Take 1 tablet by mouth 2 (two) times daily. , Disp: , Rfl: ;  cholecalciferol (VITAMIN D) 1000 UNITS tablet, Take 1,000 Units by mouth daily., Disp: , Rfl:  esomeprazole (NEXIUM) 40 MG capsule, Take 1 capsule (40 mg total) by mouth daily before breakfast., Disp: 90 capsule, Rfl: 3;  fexofenadine (ALLEGRA) 180 MG tablet, Take 180 mg by mouth every other day., Disp: , Rfl: ;  fish oil-omega-3 fatty acids 1000 MG capsule, Take 2 g by mouth daily., Disp: , Rfl: ;  LORazepam (ATIVAN) 0.5 MG tablet, Take 0.5 mg by mouth 2 (two) times daily as needed., Disp: , Rfl:  Multiple Vitamin (MULTIVITAMIN) capsule, Take 1 capsule by mouth daily., Disp: , Rfl: ;  nortriptyline (PAMELOR) 25 MG capsule, Take 25 mg by mouth at bedtime., Disp: , Rfl: ;  psyllium (METAMUCIL SMOOTH TEXTURE) 58.6 % powder, Mix one scoop in 8 ounces of water or juice daily, Disp: 283 g, Rfl: 12;  raloxifene (EVISTA) 60 MG tablet, Take 60 mg by mouth daily., Disp: , Rfl:  traMADol (ULTRAM-ER) 100 MG 24 hr tablet, Take 100-200 mg by mouth daily., Disp: , Rfl: ;  vitamin B-12 (CYANOCOBALAMIN) 1000 MCG tablet, Take 1,000 mcg by mouth daily., Disp: , Rfl:   ROS: History obtained from the patient  General ROS: negative for - chills, fatigue, fever, night sweats, weight gain or weight loss Psychological ROS: negative for - behavioral disorder, hallucinations, memory difficulties, mood swings or suicidal ideation Ophthalmic ROS: negative for - blurry vision, double vision, eye pain or loss of vision ENT ROS: negative for - epistaxis, nasal discharge, oral lesions, sore throat, tinnitus or vertigo Allergy and Immunology ROS: negative for - hives or  itchy/watery eyes Hematological and Lymphatic ROS: negative for - bleeding problems, bruising or swollen lymph nodes Endocrine ROS: negative for - galactorrhea, hair pattern changes, polydipsia/polyuria or temperature intolerance Respiratory ROS: negative for - cough, hemoptysis, shortness of breath or wheezing Cardiovascular ROS: negative for - chest pain, dyspnea on exertion, edema or irregular heartbeat Gastrointestinal ROS: negative for - abdominal pain, diarrhea, hematemesis, nausea/vomiting or stool incontinence Genito-Urinary ROS: urinary frequency Musculoskeletal ROS: shoulder pain Neurological ROS: as noted in HPI Dermatological ROS: negative for rash and skin lesion changes  Physical Examination: Blood pressure 139/74, pulse 73, temperature 98.2 F (36.8 C), temperature source Oral, resp. rate 18, height 5\' 3"  (1.6 m), weight 74.39 kg (164 lb), SpO2 97.00%.  Neurologic Examination: Mental Status: Alert, oriented, thought content appropriate.  Speech fluent without evidence of aphasia.  Able to follow 3 step commands without difficulty. Cranial Nerves: II: Discs flat bilaterally; Visual fields grossly normal, pupils equal, round, reactive to light and accommodation III,IV, VI: ptosis not present, extra-ocular motions intact bilaterally with bilateral nystagmus on lateral gaze V,VII: smile symmetric, facial light touch sensation normal bilaterally VIII: hearing normal bilaterally IX,X: gag reflex present XI: bilateral shoulder shrug XII: midline tongue extension Motor: Right : Upper extremity   5/5    Left:     Upper extremity   5/5  Lower extremity   5/5     Lower extremity   5/5 Tone and bulk:normal tone throughout; no atrophy noted Sensory: Pinprick and light touch intact throughout, bilaterally Deep Tendon: Reflexes: 2+ in the upper extremities, 1+ at the knees and absent at the ankles Plantars: Right: downgoing   Left: downgoing Cerebellar: normal finger-to-nose and  normal heel-to-shin test Gait: Unable to test secondary to instability CV: pulses palpable throughout     Laboratory Studies:  Basic Metabolic Panel:  Recent Labs Lab 09/10/13 0710  NA 142  K 4.5  CL 101  CO2 26  GLUCOSE 91  BUN 15  CREATININE 0.70  CALCIUM 9.0    Liver Function Tests:  Recent Labs Lab 09/10/13 0710  AST 27  ALT 22  ALKPHOS 74  BILITOT 0.4  PROT 7.1  ALBUMIN 3.6   No results found for this basename: LIPASE, AMYLASE,  in the last 168 hours No results found for this basename: AMMONIA,  in the last 168 hours  CBC:  Recent Labs Lab 09/10/13 0710  WBC 6.8  NEUTROABS 3.9  HGB 14.3  HCT 43.2  MCV 91.3  PLT 290    Cardiac Enzymes: No results found for this basename: CKTOTAL, CKMB, CKMBINDEX, TROPONINI,  in the last 168 hours  BNP: No components found with this basename: POCBNP,   CBG: No results found for this basename: GLUCAP,  in the last 168 hours  Microbiology: No results found for this or any previous visit.  Coagulation Studies: No results found for this basename: LABPROT, INR,  in the last 72 hours  Urinalysis:  Recent Labs Lab 09/10/13 0755  COLORURINE YELLOW  LABSPEC 1.011  PHURINE 6.0  GLUCOSEU NEGATIVE  [place] NEGATIVE  PROTEINUR NEGATIVE  UROBILINOGEN 0.2  NITRITE NEGATIVE  LEUKOCYTESUR TRACE*    Lipid Panel: No results found for this basename: chol, trig, hdl, cholhdl, vldl, ldlcalc    HgbA1C:  No results found for this basename: HGBA1C    Urine Drug Screen:   No results found for this basename: labopia, cocainscrnur, labbenz, amphetmu, thcu, labbarb    Alcohol Level: No results found for this basename: ETH,  in the last 168 hours  Other results: EKG: sinus rhythm at 60 bpm.  Imaging: Ct Head Wo Contrast  09/10/2013   CLINICAL DATA:  Sudden onset of dizziness.  EXAM: CT HEAD WITHOUT CONTRAST  TECHNIQUE: Contiguous axial images were obtained from the base of  the skull through the vertex without intravenous contrast.  COMPARISON:  None.  FINDINGS: No intra-axial or extra-axial pathologic fluid or blood collection identified. No mass lesion. No hydrocephalus. Mild white matter changes noted suggesting mild chronic white matter ischemia. Orbits are unremarkable. Visualized paranasal sinuses are normal. No acute bony abnormality identified.  IMPRESSION: 1. Chronic white matter ischemia. 2. No acute abnormality.   Electronically Signed   By: Marcello Moores  Register   On: 09/10/2013 08:19    Assessment: 80 y.o. female presenting with acute onset vertigo.  Her neurological examination is otherwise unremarkable.  The patient has bilateral stapedectomies and a history of inner ear abnormalities, placing her at risk for vertigo.  Also because of the stapedectomies she is unable to have a MRI of the brain.  Head CT reviewed and shows no acute changes.  Would like to rule out the possibility of a stroke though as well.  Further testing recommended.  Patient on ASA daily.    Stroke Risk Factors - hyperlipidemia  Plan: 1. CT angio of the head and neck.   2.  Will review results of above.  If angio unremarkable would treat vertigo symptomatically and have patient follow up with her ENT.  If significant stenosis seen will make further recommendations at that time based on location and degree of stenosis.    Case discussed with Dr. Christa See, MD [facility] [phone] 09/10/2013, 2:11 PM

## 2013-09-10 NOTE — ED Notes (Signed)
Pt continues to be monitored by blood pressure, pulse ox, and 12 lead.

## 2013-09-10 NOTE — ED Notes (Signed)
Assisted RN Hassan Rowan in Walking with Pt. Hooked Pt back up to 5 and 12 Lead, Pulse Ox and BP.

## 2013-09-10 NOTE — ED Notes (Signed)
Dr. Reynolds at bedside.

## 2013-09-10 NOTE — ED Notes (Signed)
Pt woke 0700 09/09/13 and got vertigo while trying to get OOB. Vertigo resolved  And Pt went about normal daily routine . Pt took 2 motion sickness pills  Went to bed and again woke up 0500 09/10/13 with vertigo again with nausia and came to ED. Pt states that she doesn't feel right ATT but vertigo seems to be resolved for the moment.

## 2013-09-10 NOTE — ED Provider Notes (Signed)
CSN: [id number]     Arrival date & time 09/10/13  0600 History   First MD Initiated Contact with Patient 09/10/13 [phone]     Chief Complaint  Patient presents with  . Dizziness     (Consider location/radiation/quality/duration/timing/severity/associated sxs/prior Treatment) HPI Patient presents to the emergency department with vertigo that started yesterday evening.  The patient, states, that started having some vertigo with position changes.  She went to bed last night and woke up this morning around 5 AM with continued symptoms.  Patient took Dramamine last night and then she took Ambien this morning.  Patient denies headache, blurred vision, weakness, dizziness, numbness, dysuria, back pain, neck pain, chest pain, shortness breath, abdominal pain, vomiting, diarrhea, fever, rash, sore throat, or syncope.  The patient, states she did not take any other medications prior to arrival.  Patient, states, position change makes her symptoms worse Past Medical History  Diagnosis Date  . Diverticulosis   . Allergic rhinitis   . Hyperlipidemia   . Headache(784.0)     sinus HAs  . Breast cancer     left breast cancer  . Arthritis    Past Surgical History  Procedure Laterality Date  . Total knee arthroplasty      right  . Nasal sinus surgery      2004  . Replacement total knee      left  . Abdominal hysterectomy    . Mastectomy      left  . Shoulder surgery Bilateral     repairs  . Esophagogastroduodenoscopy N/A 08/15/2012    Procedure: ESOPHAGOGASTRODUODENOSCOPY (EGD);  Surgeon: Inda Castle, MD;  Location: [facility];  Service: Endoscopy;  Laterality: N/A;   Family History  Problem Relation Age of Onset  . Colon cancer Father 11  . Breast cancer Mother   . Pancreatic cancer Sister    History  Substance Use Topics  . Smoking status: Former Smoker    Quit date: 01/15/1958  . Smokeless tobacco: Never Used  . Alcohol Use: Yes     Comment: bourbon nightly, wine every other  night   OB History   Grav Para Term Preterm Abortions TAB SAB Ect Mult Living                 Review of Systems All other systems negative except as documented in the HPI. All pertinent positives and negatives as reviewed in the HPI.   Allergies  Review of patient's allergies indicates no known allergies.  Home Medications   Prior to Admission medications   Medication Sig Start Date End Date Taking? Authorizing Provider  amitriptyline (ELAVIL) 25 MG tablet Take 25 mg by mouth at bedtime.   Yes Historical Provider, MD  aspirin 81 MG tablet Take 81 mg by mouth daily.   Yes Historical Provider, MD  calcium-vitamin D (OSCAL WITH D) 500-200 MG-UNIT per tablet Take 1 tablet by mouth 2 (two) times daily.    Yes Historical Provider, MD  cholecalciferol (VITAMIN D) 1000 UNITS tablet Take 1,000 Units by mouth daily.   Yes Historical Provider, MD  esomeprazole (NEXIUM) 40 MG capsule Take 1 capsule (40 mg total) by mouth daily before breakfast. 10/10/12  Yes Inda Castle, MD  fexofenadine (ALLEGRA) 180 MG tablet Take 180 mg by mouth every other day.   Yes Historical Provider, MD  fish oil-omega-3 fatty acids 1000 MG capsule Take 2 g by mouth daily.   Yes Historical Provider, MD  LORazepam (ATIVAN) 0.5 MG tablet Take 0.5 mg  by mouth 2 (two) times daily as needed. 08/25/13  Yes Historical Provider, MD  Multiple Vitamin (MULTIVITAMIN) capsule Take 1 capsule by mouth daily.   Yes Historical Provider, MD  nortriptyline (PAMELOR) 25 MG capsule Take 25 mg by mouth at bedtime. 07/23/13  Yes Historical Provider, MD  psyllium (METAMUCIL SMOOTH TEXTURE) 58.6 % powder Mix one scoop in 8 ounces of water or juice daily 01/13/13  Yes Irene Shipper, MD  raloxifene (EVISTA) 60 MG tablet Take 60 mg by mouth daily.   Yes Historical Provider, MD  traMADol (ULTRAM-ER) 100 MG 24 hr tablet Take 100-200 mg by mouth daily.   Yes Historical Provider, MD  vitamin B-12 (CYANOCOBALAMIN) 1000 MCG tablet Take 1,000 mcg by mouth  daily.   Yes Historical Provider, MD   BP 139/74  Pulse 73  Temp(Src) 98.2 F (36.8 C) (Oral)  Resp 18  Ht 5\' 3"  (1.6 m)  Wt 164 lb (74.39 kg)  BMI 29.06 kg/m2  SpO2 97% Physical Exam  Nursing note and vitals reviewed. Constitutional: She is oriented to person, place, and time. She appears well-developed and well-nourished. No distress.  HENT:  Head: Normocephalic and atraumatic.  Mouth/Throat: Oropharynx is clear and moist. No oropharyngeal exudate.  Eyes: Pupils are equal, round, and reactive to light.  Neck: Normal range of motion. Neck supple.  Cardiovascular: Normal rate, regular rhythm and normal heart sounds.  Exam reveals no gallop and no friction rub.   No murmur heard. Pulmonary/Chest: Effort normal and breath sounds normal. No respiratory distress.  Neurological: She is alert and oriented to person, place, and time. She exhibits normal muscle tone. Coordination normal.  Skin: Skin is warm and dry. No erythema.    ED Course  Procedures (including critical care time) Labs Review Labs Reviewed  COMPREHENSIVE METABOLIC PANEL - Abnormal; Notable for the following:    GFR calc non Af Amer 79 (*)    All other components within normal limits  URINALYSIS, ROUTINE W REFLEX MICROSCOPIC - Abnormal; Notable for the following:    Hgb urine dipstick SMALL (*)    Leukocytes, UA TRACE (*)    All other components within normal limits  URINE CULTURE  CBC WITH DIFFERENTIAL  URINE MICROSCOPIC-ADD ON    Imaging Review Ct Angio Head W/cm &/or Wo Cm  09/10/2013   CLINICAL DATA:  80 year old female with acute onset/recurrent vertigo. Nausea. Initial encounter.  EXAM: CT ANGIOGRAPHY HEAD AND NECK  TECHNIQUE: Multidetector CT imaging of the head and neck was performed using the standard protocol during bolus administration of intravenous contrast. Multiplanar CT image reconstructions and MIPs were obtained to evaluate the vascular anatomy. Carotid stenosis measurements (when applicable)  are obtained utilizing NASCET criteria, using the distal internal carotid diameter as the denominator.  CONTRAST:  59mL OMNIPAQUE IOHEXOL 350 MG/ML SOLN  COMPARISON:  Head CT without contrast 0804 hr the same day.  FINDINGS: CTA HEAD FINDINGS  No abnormal enhancement of the brain. Stable gray-white matter differentiation. No intracranial mass effect. No evidence of cortically based acute infarction identified.  VASCULAR FINDINGS:  Major intracranial venous structures appear to be normally enhancing.  Negative and fairly codominant distal vertebral arteries. Normal vertebrobasilar junction. Normal left PICA origin. Dominant right AICA origin is patent. No basilar artery stenosis. SCA and right PCA origins are normal. Fetal type left PCA origin. Diminutive or absent right posterior communicating artery. Bilateral PCA branches are within normal limits.  Patent ICA siphons with mild for age calcified plaque and no stenosis. Normal bilateral  ophthalmic and left posterior communicating artery origins. Patent carotid termini. MCA and right ACA origins are normal. Non dominant left A1 segment. Anterior communicating artery and bilateral ACA branches are within normal limits. Left MCA branches are within normal limits. Right MCA branches are within normal limits; the anterior sylvian division on the right is diminutive, but favor normal anatomic variation rather than anterior M2 branch stenosis.  Review of the MIP images confirms the above findings.  CTA NECK FINDINGS  Negative visualized lung apices. Small superior mediastinal lymph nodes, no mediastinal lymphadenopathy identified. Thyromegaly with subcentimeter bilateral hypodense nodules, no followup indicated in this age group. Negative larynx and pharynx (mild motion artifact), parapharyngeal spaces, retropharyngeal space, sublingual space, submandibular glands, parotid glands. Postoperative changes to both globes. Visualized paranasal sinuses and mastoids are clear.  There is evidence of a left stapes metallic implant seen on series 41, image 100. No acute osseous abnormality identified. Degenerative changes throughout the cervical spine. No cervical lymphadenopathy.  VASCULAR FINDINGS:  3 vessel arch configuration with mild for age arch and great vessel origin atherosclerosis.  No right CCA origin stenosis. Negative right CCA. Mild motion artifact at the right carotid bifurcation, which appears to be normal. Negative cervical right ICA.  No proximal right subclavian artery stenosis. Normal right vertebral artery origin. Normal right vertebral artery to the skullbase.  No left CCA origin stenosis despite mild atherosclerosis. Partially retropharyngeal course of the left carotid. Motion artifact at the left carotid bifurcation where there is calcified plaque at the left ICA origin, but less than 50 % stenosis with respect to the distal vessel. Negative cervical left ICA more distally except for tortuosity.  No proximal left subclavian artery stenosis despite atherosclerosis. Left vertebral artery origin is normal. Tortuous proximal left vertebral. Otherwise normal left vertebral artery to the skullbase.  Review of the MIP images confirms the above findings.  IMPRESSION: 1. Negative for age CTA head and neck. 2. Stable CT appearance of the brain.   Electronically Signed   By: Lars Pinks M.D.   On: 09/10/2013 15:40   Ct Head Wo Contrast  09/10/2013   CLINICAL DATA:  Sudden onset of dizziness.  EXAM: CT HEAD WITHOUT CONTRAST  TECHNIQUE: Contiguous axial images were obtained from the base of the skull through the vertex without intravenous contrast.  COMPARISON:  None.  FINDINGS: No intra-axial or extra-axial pathologic fluid or blood collection identified. No mass lesion. No hydrocephalus. Mild white matter changes noted suggesting mild chronic white matter ischemia. Orbits are unremarkable. Visualized paranasal sinuses are normal. No acute bony abnormality identified.   IMPRESSION: 1. Chronic white matter ischemia. 2. No acute abnormality.   Electronically Signed   By: Marcello Moores  Register   On: 09/10/2013 08:19   Ct Angio Neck W/cm &/or Wo/cm  09/10/2013   CLINICAL DATA:  80 year old female with acute onset/recurrent vertigo. Nausea. Initial encounter.  EXAM: CT ANGIOGRAPHY HEAD AND NECK  TECHNIQUE: Multidetector CT imaging of the head and neck was performed using the standard protocol during bolus administration of intravenous contrast. Multiplanar CT image reconstructions and MIPs were obtained to evaluate the vascular anatomy. Carotid stenosis measurements (when applicable) are obtained utilizing NASCET criteria, using the distal internal carotid diameter as the denominator.  CONTRAST:  67mL OMNIPAQUE IOHEXOL 350 MG/ML SOLN  COMPARISON:  Head CT without contrast 0804 hr the same day.  FINDINGS: CTA HEAD FINDINGS  No abnormal enhancement of the brain. Stable gray-white matter differentiation. No intracranial mass effect. No evidence of cortically based acute  infarction identified.  VASCULAR FINDINGS:  Major intracranial venous structures appear to be normally enhancing.  Negative and fairly codominant distal vertebral arteries. Normal vertebrobasilar junction. Normal left PICA origin. Dominant right AICA origin is patent. No basilar artery stenosis. SCA and right PCA origins are normal. Fetal type left PCA origin. Diminutive or absent right posterior communicating artery. Bilateral PCA branches are within normal limits.  Patent ICA siphons with mild for age calcified plaque and no stenosis. Normal bilateral ophthalmic and left posterior communicating artery origins. Patent carotid termini. MCA and right ACA origins are normal. Non dominant left A1 segment. Anterior communicating artery and bilateral ACA branches are within normal limits. Left MCA branches are within normal limits. Right MCA branches are within normal limits; the anterior sylvian division on the right is  diminutive, but favor normal anatomic variation rather than anterior M2 branch stenosis.  Review of the MIP images confirms the above findings.  CTA NECK FINDINGS  Negative visualized lung apices. Small superior mediastinal lymph nodes, no mediastinal lymphadenopathy identified. Thyromegaly with subcentimeter bilateral hypodense nodules, no followup indicated in this age group. Negative larynx and pharynx (mild motion artifact), parapharyngeal spaces, retropharyngeal space, sublingual space, submandibular glands, parotid glands. Postoperative changes to both globes. Visualized paranasal sinuses and mastoids are clear. There is evidence of a left stapes metallic implant seen on series 41, image 100. No acute osseous abnormality identified. Degenerative changes throughout the cervical spine. No cervical lymphadenopathy.  VASCULAR FINDINGS:  3 vessel arch configuration with mild for age arch and great vessel origin atherosclerosis.  No right CCA origin stenosis. Negative right CCA. Mild motion artifact at the right carotid bifurcation, which appears to be normal. Negative cervical right ICA.  No proximal right subclavian artery stenosis. Normal right vertebral artery origin. Normal right vertebral artery to the skullbase.  No left CCA origin stenosis despite mild atherosclerosis. Partially retropharyngeal course of the left carotid. Motion artifact at the left carotid bifurcation where there is calcified plaque at the left ICA origin, but less than 50 % stenosis with respect to the distal vessel. Negative cervical left ICA more distally except for tortuosity.  No proximal left subclavian artery stenosis despite atherosclerosis. Left vertebral artery origin is normal. Tortuous proximal left vertebral. Otherwise normal left vertebral artery to the skullbase.  Review of the MIP images confirms the above findings.  IMPRESSION: 1. Negative for age CTA head and neck. 2. Stable CT appearance of the brain.   Electronically  Signed   By: Lars Pinks M.D.   On: 09/10/2013 15:40   Patient has had several medication changes, lately, that could contribute to her symptoms.  She was seen by neurology, who agreed with this assessment and felt that if she had negative CTs of her head and neck that she could go home with further evaluation with ear, nose, and throat.  We did get an MRI of her brain, but she had a procedure done where she had some metal insert it into her body and was not able to patient is advised to follow up with her primary care Dr. and ENT.  Brent General, PA-C 09/10/13 1549

## 2013-09-11 LAB — URINE CULTURE: Colony Count: 40000

## 2013-11-19 ENCOUNTER — Emergency Department (HOSPITAL_COMMUNITY): Payer: Medicare Other

## 2013-11-19 ENCOUNTER — Encounter (HOSPITAL_COMMUNITY): Payer: Self-pay | Admitting: Emergency Medicine

## 2013-11-19 ENCOUNTER — Emergency Department (HOSPITAL_COMMUNITY)
Admission: EM | Admit: 2013-11-19 | Discharge: 2013-11-19 | Disposition: A | Discharge status: Home / Self Care | Payer: Medicare Other | Attending: Emergency Medicine | Admitting: Emergency Medicine

## 2013-11-19 DIAGNOSIS — Z7982 Long term (current) use of aspirin: Secondary | ICD-10-CM | POA: Diagnosis not present

## 2013-11-19 DIAGNOSIS — Y9289 Other specified places as the place of occurrence of the external cause: Secondary | ICD-10-CM | POA: Insufficient documentation

## 2013-11-19 DIAGNOSIS — M1990 Unspecified osteoarthritis, unspecified site: Secondary | ICD-10-CM | POA: Diagnosis not present

## 2013-11-19 DIAGNOSIS — Z8719 Personal history of other diseases of the digestive system: Secondary | ICD-10-CM | POA: Diagnosis not present

## 2013-11-19 DIAGNOSIS — S0990XA Unspecified injury of head, initial encounter: Secondary | ICD-10-CM | POA: Diagnosis not present

## 2013-11-19 DIAGNOSIS — E785 Hyperlipidemia, unspecified: Secondary | ICD-10-CM | POA: Diagnosis not present

## 2013-11-19 DIAGNOSIS — Z853 Personal history of malignant neoplasm of breast: Secondary | ICD-10-CM | POA: Diagnosis not present

## 2013-11-19 DIAGNOSIS — W01198A Fall on same level from slipping, tripping and stumbling with subsequent striking against other object, initial encounter: Secondary | ICD-10-CM | POA: Insufficient documentation

## 2013-11-19 DIAGNOSIS — Z8709 Personal history of other diseases of the respiratory system: Secondary | ICD-10-CM | POA: Diagnosis not present

## 2013-11-19 DIAGNOSIS — Z79899 Other long term (current) drug therapy: Secondary | ICD-10-CM | POA: Insufficient documentation

## 2013-11-19 DIAGNOSIS — Z87891 Personal history of nicotine dependence: Secondary | ICD-10-CM | POA: Insufficient documentation

## 2013-11-19 DIAGNOSIS — Y9389 Activity, other specified: Secondary | ICD-10-CM | POA: Diagnosis not present

## 2013-11-19 DIAGNOSIS — R42 Dizziness and giddiness: Secondary | ICD-10-CM

## 2013-11-19 LAB — COMPREHENSIVE METABOLIC PANEL
ALT: 26 U/L (ref 0–35)
AST: 48 U/L — ABNORMAL HIGH (ref 0–37)
Albumin: 3.4 g/dL — ABNORMAL LOW (ref 3.5–5.2)
Alkaline Phosphatase: 52 U/L (ref 39–117)
Anion gap: 12 (ref 5–15)
BUN: 21 mg/dL (ref 6–23)
CALCIUM: 9.3 mg/dL (ref 8.4–10.5)
CO2: 25 meq/L (ref 19–32)
Chloride: 102 mEq/L (ref 96–112)
Creatinine, Ser: 0.7 mg/dL (ref 0.50–1.10)
GFR calc Af Amer: >90 mL/min (ref >90)
GFR, EST NON AFRICAN AMERICAN: 79 mL/min — AB (ref >90)
Glucose, Bld: 110 mg/dL — ABNORMAL HIGH (ref 70–99)
Potassium: 6.2 mEq/L — ABNORMAL HIGH (ref 3.7–5.3)
SODIUM: 139 meq/L (ref 137–147)
TOTAL PROTEIN: 6.5 g/dL (ref 6.0–8.3)
Total Bilirubin: 0.4 mg/dL (ref 0.3–1.2)

## 2013-11-19 LAB — CBC WITH DIFFERENTIAL/PLATELET
BASOS ABS: 0 10*3/uL (ref 0.0–0.1)
Basophils Relative: 0 % (ref 0–1)
EOS PCT: 1 % (ref 0–5)
Eosinophils Absolute: 0.1 10*3/uL (ref 0.0–0.7)
HCT: 40.2 % (ref 36.0–46.0)
Hemoglobin: 13.5 g/dL (ref 12.0–15.0)
LYMPHS PCT: 16 % (ref 12–46)
Lymphs Abs: 1.5 10*3/uL (ref 0.7–4.0)
MCH: 30.2 pg (ref 26.0–34.0)
MCHC: 33.6 g/dL (ref 30.0–36.0)
MCV: 89.9 fL (ref 78.0–100.0)
Monocytes Absolute: 0.9 10*3/uL (ref 0.1–1.0)
Monocytes Relative: 10 % (ref 3–12)
Neutro Abs: 6.6 10*3/uL (ref 1.7–7.7)
Neutrophils Relative %: 73 % (ref 43–77)
Platelets: 267 10*3/uL (ref 150–400)
RBC: 4.47 MIL/uL (ref 3.87–5.11)
RDW: 14.4 % (ref 11.5–15.5)
WBC: 9.1 10*3/uL (ref 4.0–10.5)

## 2013-11-19 NOTE — ED Notes (Signed)
Pt ambulated in hallway by this RN with no weakness, dizziness, SHOB. Pt states it's painful to walk on L leg d/t fall yesterday, but able to ambulate independently with ease.

## 2013-11-19 NOTE — ED Notes (Signed)
Pt arrives via EMS with c/o sudden onset weakness/dizziness. Cool, clammy. Hx of vertigo, states this feels different. Hit head and hip yesterday. Not orthostatic positive per EMS.

## 2013-11-20 NOTE — ED Provider Notes (Signed)
CSN: [id number]     Arrival date & time 11/19/13  0303 History   First MD Initiated Contact with Patient 11/19/13 [phone]     Chief Complaint  Patient presents with  . Weakness  . Dizziness     HPI Patient reports she awoke in the middle the night and began having dizziness which she describes as a sense of things moving in front of her.  She then became short of breath and nauseated and slightly diaphoretic per the husband.  Husband reports that he checked her pulse and her pulse was faint her blood pressure was low and thus he called 911.  At this time the patient is lying in the gurney in the ER and has no complaints.  She denies chest pain or shortness of breath.  No headache.  No weakness of her arms or legs.  No altered mental status per the husband.  Patient reports no recent illness.  She states this felt slightly different than her vertigo and she has had many times before.   Past Medical History  Diagnosis Date  . Diverticulosis   . Allergic rhinitis   . Hyperlipidemia   . Headache(784.0)     sinus HAs  . Breast cancer     left breast cancer  . Arthritis    Past Surgical History  Procedure Laterality Date  . Total knee arthroplasty      right  . Nasal sinus surgery      2004  . Replacement total knee      left  . Abdominal hysterectomy    . Mastectomy      left  . Shoulder surgery Bilateral     repairs  . Esophagogastroduodenoscopy N/A 08/15/2012    Procedure: ESOPHAGOGASTRODUODENOSCOPY (EGD);  Surgeon: Inda Castle, MD;  Location: [facility];  Service: Endoscopy;  Laterality: N/A;   Family History  Problem Relation Age of Onset  . Colon cancer Father 43  . Breast cancer Mother   . Pancreatic cancer Sister    History  Substance Use Topics  . Smoking status: Former Smoker    Quit date: 01/15/1958  . Smokeless tobacco: Never Used  . Alcohol Use: Yes     Comment: bourbon nightly, wine every other night   OB History    No data available     Review of  Systems  All other systems reviewed and are negative.     Allergies  Review of patient's allergies indicates no known allergies.  Home Medications   Prior to Admission medications   Medication Sig Start Date End Date Taking? Authorizing Provider  aspirin 81 MG tablet Take 81 mg by mouth daily.   Yes Historical Provider, MD  calcium-vitamin D (OSCAL WITH D) 500-200 MG-UNIT per tablet Take 1 tablet by mouth 2 (two) times daily.    Yes Historical Provider, MD  cholecalciferol (VITAMIN D) 1000 UNITS tablet Take 1,000 Units by mouth daily.   Yes Historical Provider, MD  donepezil (ARICEPT) 10 MG tablet Take 3-10 mg by mouth at bedtime as needed (for sleep, patient usually takes a third of a tablet).   Yes Historical Provider, MD  esomeprazole (NEXIUM) 40 MG capsule Take 1 capsule (40 mg total) by mouth daily before breakfast. 10/10/12  Yes Inda Castle, MD  fexofenadine (ALLEGRA) 180 MG tablet Take 180 mg by mouth every other day.   Yes Historical Provider, MD  fish oil-omega-3 fatty acids 1000 MG capsule Take 2 g by mouth daily.   Yes  Historical Provider, MD  GLUCOSAMINE-CHONDROITIN PO Take 1 tablet by mouth daily.   Yes Historical Provider, MD  Multiple Vitamin (MULTIVITAMIN WITH MINERALS) TABS tablet Take 1 tablet by mouth daily.   Yes Historical Provider, MD  Multiple Vitamin (MULTIVITAMIN) capsule Take 1 capsule by mouth daily.   Yes Historical Provider, MD  nabumetone (RELAFEN) 750 MG tablet Take 750 mg by mouth daily.   Yes Historical Provider, MD  nortriptyline (PAMELOR) 25 MG capsule Take 25 mg by mouth at bedtime. 07/23/13  Yes Historical Provider, MD  raloxifene (EVISTA) 60 MG tablet Take 60 mg by mouth daily.   Yes Historical Provider, MD  traMADol (ULTRAM-ER) 100 MG 24 hr tablet Take 100-200 mg by mouth daily.   Yes Historical Provider, MD  vitamin B-12 (CYANOCOBALAMIN) 1000 MCG tablet Take 1,000 mcg by mouth daily.   Yes Historical Provider, MD  amitriptyline (ELAVIL) 25 MG  tablet Take 25 mg by mouth at bedtime.    Historical Provider, MD  LORazepam (ATIVAN) 0.5 MG tablet Take 0.5 mg by mouth 2 (two) times daily as needed. 08/25/13   Historical Provider, MD  meclizine (ANTIVERT) 25 MG tablet Take 1 tablet (25 mg total) by mouth 3 (three) times daily as needed for dizziness. 09/10/13   Resa Miner Lawyer, PA-C  psyllium (METAMUCIL SMOOTH TEXTURE) 58.6 % powder Mix one scoop in 8 ounces of water or juice daily 01/13/13   Irene Shipper, MD   BP 125/54 mmHg  Pulse 71  Temp(Src) 97.8 F (36.6 C) (Oral)  Resp 16  Ht 5\' 3"  (1.6 m)  Wt 164 lb (74.39 kg)  BMI 29.06 kg/m2  SpO2 95% Physical Exam  Constitutional: She is oriented to person, place, and time. She appears well-developed and well-nourished. No distress.  HENT:  Head: Normocephalic and atraumatic.  Eyes: EOM are normal. Pupils are equal, round, and reactive to light.  Neck: Normal range of motion.  Cardiovascular: Normal rate, regular rhythm and normal heart sounds.   Pulmonary/Chest: Effort normal and breath sounds normal.  Abdominal: Soft. She exhibits no distension. There is no tenderness.  Musculoskeletal: Normal range of motion.  Neurological: She is alert and oriented to person, place, and time.  5/5 strength in major muscle groups of  bilateral upper and lower extremities. Speech normal. No facial asymetry.   Skin: Skin is warm and dry.  Psychiatric: She has a normal mood and affect. Judgment normal.  Nursing note and vitals reviewed.   ED Course  Procedures (including critical care time) Labs Review Labs Reviewed  COMPREHENSIVE METABOLIC PANEL - Abnormal; Notable for the following:    Potassium 6.2 (*)    Glucose, Bld 110 (*)    Albumin 3.4 (*)    AST 48 (*)    GFR calc non Af Amer 79 (*)    All other components within normal limits  CBC WITH DIFFERENTIAL    Imaging Review Ct Head Wo Contrast  11/19/2013   CLINICAL DATA:  Sudden onset of weakness and dizziness. Slipped and fall,  striking head on the front left side.  EXAM: CT HEAD WITHOUT CONTRAST  TECHNIQUE: Contiguous axial images were obtained from the base of the skull through the vertex without intravenous contrast.  COMPARISON:  09/10/2013  FINDINGS: Diffuse cerebral atrophy. Mild ventricular dilatation consistent with central atrophy. Patchy low-attenuation change in the deep white matter consistent with small vessel ischemia. No mass effect or midline shift. No abnormal extra-axial fluid collections. Gray-white matter junctions are distinct. Basal cisterns are not  effaced. No evidence of acute intracranial hemorrhage. No depressed skull fractures. Visualized paranasal sinuses and mastoid air cells are not opacified. Vascular calcifications.  IMPRESSION: No acute intracranial abnormalities. Chronic atrophy and small vessel ischemic changes.   Electronically Signed   By: Lucienne Capers M.D.   On: 11/19/2013 05:11  I personally reviewed the imaging tests through PACS system I reviewed available ER/hospitalization records through the EMR    EKG Interpretation   Date/Time:  Thursday November 19 2013 03:12:00 EST Ventricular Rate:  63 PR Interval:  176 QRS Duration: 96 QT Interval:  453 QTC Calculation: 464 R Axis:   15 Text Interpretation:   Sinus rhythm No significant change was found  Confirmed by Natanel Snavely  MD, Alexandra Posadas ([id number]) on 11/19/2013 3:19:18 AM      MDM   Final diagnoses:  Head injury  Dizziness    The patient has no significant complaints in the ER.  She is ambulated in the ER and feels well and ambulated without any difficulty.  I suspect the patient had transient vertigo symptoms as she does report that her symptoms were worse if she made rapid changes in her head position to the left or to the right.  Head CT negative. Labs without significant abnormality.  Discharge home in good condition.   Hoy Morn, MD 11/20/13 [phone]

## 2013-11-25 ENCOUNTER — Other Ambulatory Visit: Payer: Self-pay | Admitting: Dermatology

## 2013-12-17 ENCOUNTER — Other Ambulatory Visit (HOSPITAL_COMMUNITY): Payer: Medicare Other

## 2013-12-28 ENCOUNTER — Ambulatory Visit (HOSPITAL_COMMUNITY): Payer: Medicare Other

## 2014-01-02 ENCOUNTER — Emergency Department (HOSPITAL_COMMUNITY): Payer: Medicare Other

## 2014-01-02 ENCOUNTER — Emergency Department (HOSPITAL_COMMUNITY)
Admission: EM | Admit: 2014-01-02 | Discharge: 2014-01-02 | Disposition: A | Discharge status: Home / Self Care | Payer: Medicare Other | Attending: Emergency Medicine | Admitting: Emergency Medicine

## 2014-01-02 ENCOUNTER — Encounter (HOSPITAL_COMMUNITY): Payer: Self-pay | Admitting: Emergency Medicine

## 2014-01-02 DIAGNOSIS — Z7982 Long term (current) use of aspirin: Secondary | ICD-10-CM | POA: Diagnosis not present

## 2014-01-02 DIAGNOSIS — Z8719 Personal history of other diseases of the digestive system: Secondary | ICD-10-CM | POA: Insufficient documentation

## 2014-01-02 DIAGNOSIS — G500 Trigeminal neuralgia: Secondary | ICD-10-CM

## 2014-01-02 DIAGNOSIS — Z87891 Personal history of nicotine dependence: Secondary | ICD-10-CM | POA: Diagnosis not present

## 2014-01-02 DIAGNOSIS — R51 Headache: Secondary | ICD-10-CM | POA: Insufficient documentation

## 2014-01-02 DIAGNOSIS — R519 Headache, unspecified: Secondary | ICD-10-CM

## 2014-01-02 DIAGNOSIS — E785 Hyperlipidemia, unspecified: Secondary | ICD-10-CM | POA: Diagnosis not present

## 2014-01-02 DIAGNOSIS — M792 Neuralgia and neuritis, unspecified: Secondary | ICD-10-CM | POA: Diagnosis not present

## 2014-01-02 DIAGNOSIS — M1990 Unspecified osteoarthritis, unspecified site: Secondary | ICD-10-CM | POA: Insufficient documentation

## 2014-01-02 DIAGNOSIS — Z853 Personal history of malignant neoplasm of breast: Secondary | ICD-10-CM | POA: Diagnosis not present

## 2014-01-02 LAB — SEDIMENTATION RATE: Sed Rate: 8 mm/hr (ref 0–22)

## 2014-01-02 MED ORDER — GABAPENTIN 300 MG PO CAPS: 300.0000 mg | ORAL_CAPSULE | Freq: Every day | ORAL | Status: DC

## 2014-01-02 NOTE — ED Provider Notes (Signed)
CSN: [id number]     Arrival date & time 01/02/14  1216 History   First MD Initiated Contact with Patient 01/02/14 1242     Chief Complaint  Patient presents with  . Head Injury      HPI  Patient presents for evaluation of right frontal headache. Patient had an episode in November on the fourth where she fell. She denies remembering that she struck her head but remembers "seen quite". Seen the following day with right frontal headache and had normal CT scan.. She presents today with a one page typed multiple paragraph description of her symptoms since that time.  Social she has had pain from her hair and hairline in the right frontal temporal scalp down to her maxilla on the right side not crossing midline. Not descending to the lower face.  No sinus symptoms nasal discharge or congestion. No rash or vesicles. No ear pain or symptoms. No vision changes. No jaw claudication. No cord dentition. No neck pain. No posterior head or neck pain. No extremity symptoms.  Past Medical History  Diagnosis Date  . Diverticulosis   . Allergic rhinitis   . Hyperlipidemia   . Headache(784.0)     sinus HAs  . Breast cancer     left breast cancer  . Arthritis    Past Surgical History  Procedure Laterality Date  . Total knee arthroplasty      right  . Nasal sinus surgery      2004  . Replacement total knee      left  . Abdominal hysterectomy    . Mastectomy      left  . Shoulder surgery Bilateral     repairs  . Esophagogastroduodenoscopy N/A 08/15/2012    Procedure: ESOPHAGOGASTRODUODENOSCOPY (EGD);  Surgeon: Inda Castle, MD;  Location: [facility];  Service: Endoscopy;  Laterality: N/A;   Family History  Problem Relation Age of Onset  . Colon cancer Father 76  . Breast cancer Mother   . Pancreatic cancer Sister    History  Substance Use Topics  . Smoking status: Former Smoker    Quit date: 01/15/1958  . Smokeless tobacco: Never Used  . Alcohol Use: Yes     Comment: bourbon  nightly, wine every other night   OB History    No data available     Review of Systems  Constitutional: Negative for fever, chills, diaphoresis, appetite change and fatigue.  HENT: Negative for mouth sores, sore throat and trouble swallowing.        Right facial pain and right forehead pain.  Eyes: Negative for visual disturbance.  Respiratory: Negative for cough, chest tightness, shortness of breath and wheezing.   Cardiovascular: Negative for chest pain.  Gastrointestinal: Negative for nausea, vomiting, abdominal pain, diarrhea and abdominal distention.  Endocrine: Negative for polydipsia, polyphagia and polyuria.  Genitourinary: Negative for dysuria, frequency and hematuria.  Musculoskeletal: Negative for gait problem.  Skin: Negative for color change, pallor and rash.  Neurological: Negative for dizziness, syncope, light-headedness and headaches.  Hematological: Does not bruise/bleed easily.  Psychiatric/Behavioral: Negative for behavioral problems and confusion.      Allergies  Review of patient's allergies indicates no known allergies.  Home Medications   Prior to Admission medications   Medication Sig Start Date End Date Taking? Authorizing Provider  amitriptyline (ELAVIL) 25 MG tablet Take 25 mg by mouth at bedtime.   Yes Historical Provider, MD  aspirin 81 MG tablet Take 81 mg by mouth daily.   Yes Historical  Provider, MD  calcium-vitamin D (OSCAL WITH D) 500-200 MG-UNIT per tablet Take 1 tablet by mouth 2 (two) times daily.    Yes Historical Provider, MD  cholecalciferol (VITAMIN D) 1000 UNITS tablet Take 1,000 Units by mouth daily.   Yes Historical Provider, MD  esomeprazole (NEXIUM) 20 MG capsule Take 20 mg by mouth daily at 12 noon.   Yes Historical Provider, MD  fexofenadine (ALLEGRA) 180 MG tablet Take 180 mg by mouth daily.    Yes Historical Provider, MD  fish oil-omega-3 fatty acids 1000 MG capsule Take 1 g by mouth 2 (two) times daily.    Yes Historical  Provider, MD  GLUCOSAMINE-CHONDROITIN PO Take 1 tablet by mouth daily.   Yes Historical Provider, MD  LORazepam (ATIVAN) 0.5 MG tablet Take 0.5 mg by mouth 2 (two) times daily as needed. 08/25/13  Yes Historical Provider, MD  Multiple Vitamin (MULTIVITAMIN WITH MINERALS) TABS tablet Take 1 tablet by mouth daily.   Yes Historical Provider, MD  Multiple Vitamin (MULTIVITAMIN) capsule Take 1 capsule by mouth daily.   Yes Historical Provider, MD  nabumetone (RELAFEN) 750 MG tablet Take 750 mg by mouth 2 (two) times daily.    Yes Historical Provider, MD  nortriptyline (PAMELOR) 25 MG capsule Take 25 mg by mouth at bedtime. 07/23/13  Yes Historical Provider, MD  psyllium (METAMUCIL SMOOTH TEXTURE) 58.6 % powder Mix one scoop in 8 ounces of water or juice daily 01/13/13  Yes Irene Shipper, MD  raloxifene (EVISTA) 60 MG tablet Take 60 mg by mouth daily.   Yes Historical Provider, MD  traMADol (ULTRAM) 50 MG tablet Take 50 mg by mouth every 6 (six) hours as needed.   Yes Historical Provider, MD  vitamin B-12 (CYANOCOBALAMIN) 1000 MCG tablet Take 1,000 mcg by mouth daily.   Yes Historical Provider, MD  zolpidem (AMBIEN) 10 MG tablet Take 3 mg by mouth at bedtime as needed for sleep.   Yes Historical Provider, MD  donepezil (ARICEPT) 10 MG tablet Take 3-10 mg by mouth at bedtime as needed (for sleep, patient usually takes a third of a tablet).    Historical Provider, MD  esomeprazole (NEXIUM) 40 MG capsule Take 1 capsule (40 mg total) by mouth daily before breakfast. 10/10/12   Inda Castle, MD  gabapentin (NEURONTIN) 300 MG capsule Take 1 capsule (300 mg total) by mouth at bedtime. 01/02/14   Tanna Furry, MD  meclizine (ANTIVERT) 25 MG tablet Take 1 tablet (25 mg total) by mouth 3 (three) times daily as needed for dizziness. Patient not taking: Reported on 01/02/2014 09/10/13   Resa Miner Lawyer, PA-C  traMADol (ULTRAM-ER) 100 MG 24 hr tablet Take 100-200 mg by mouth daily.    Historical Provider, MD   BP  132/70 mmHg  Pulse 75  Temp(Src) 97.7 F (36.5 C) (Oral)  Resp 18  Ht 5\' 3"  (1.6 m)  Wt 164 lb (74.39 kg)  BMI 29.06 kg/m2  SpO2 97% Physical Exam  Constitutional: She is oriented to person, place, and time. She appears well-developed and well-nourished. No distress.  HENT:  Head: Normocephalic.    Eyes: Conjunctivae are normal. Pupils are equal, round, and reactive to light. No scleral icterus.  Neck: Normal range of motion. Neck supple. No thyromegaly present.  Cardiovascular: Normal rate and regular rhythm.  Exam reveals no gallop and no friction rub.   No murmur heard. Pulmonary/Chest: Effort normal and breath sounds normal. No respiratory distress. She has no wheezes. She has no rales.  Abdominal: Soft. Bowel sounds are normal. She exhibits no distension. There is no tenderness. There is no rebound.  Musculoskeletal: Normal range of motion.  Neurological: She is alert and oriented to person, place, and time.  Skin: Skin is warm and dry. No rash noted.  Psychiatric: She has a normal mood and affect. Her behavior is normal.    ED Course  Procedures (including critical care time) Labs Review Labs Reviewed  SEDIMENTATION RATE    Imaging Review Ct Head Wo Contrast  01/02/2014   CLINICAL DATA:  Patient fell several weeks ago. Right frontal pain and right maxillary pain  EXAM: CT HEAD WITHOUT CONTRAST  CT MAXILLOFACIAL WITHOUT CONTRAST  TECHNIQUE: Multidetector CT imaging of the head and maxillofacial structures were performed using the standard protocol without intravenous contrast. Multiplanar CT image reconstructions of the maxillofacial structures were also generated.  COMPARISON:  11/19/2013, 09/10/2013  FINDINGS: CT HEAD FINDINGS  There is no evidence of mass effect, midline shift, or extra-axial fluid collections. There is no evidence of a space-occupying lesion or intracranial hemorrhage. There is no evidence of a cortical-based area of acute infarction. There is  generalized cerebral atrophy. There is periventricular white matter low attenuation likely secondary to microangiopathy.  The ventricles and sulci are appropriate for the patient's age. The basal cisterns are patent.  Visualized portions of the orbits are unremarkable. The visualized portions of the paranasal sinuses and mastoid air cells are unremarkable. Cerebrovascular atherosclerotic calcifications are noted.  The osseous structures are unremarkable.  CT MAXILLOFACIAL FINDINGS  The globes are intact. The orbital walls are intact. The orbital floors are intact. The maxilla is intact. The mandible is intact. The zygomatic arches are intact. The nasal septum is midline. There is no nasal bone fracture. The temporomandibular joints are normal.  There is evidence of prior nasal sinus surgery. The paranasal sinuses are clear. The visualized portions of the mastoid sinuses are well aerated.  There is bilateral facet arthropathy at C2-3, C3-4 and C4-5.  IMPRESSION: 1. No acute intracranial pathology. 2. No acute osseous injury of the maxillofacial bones. 3. Mild generalized cerebral atrophy and chronic microvascular disease.   Electronically Signed   By: Kathreen Devoid   On: 01/02/2014 15:20   Ct Maxillofacial Wo Cm  01/02/2014   CLINICAL DATA:  Patient fell several weeks ago. Right frontal pain and right maxillary pain  EXAM: CT HEAD WITHOUT CONTRAST  CT MAXILLOFACIAL WITHOUT CONTRAST  TECHNIQUE: Multidetector CT imaging of the head and maxillofacial structures were performed using the standard protocol without intravenous contrast. Multiplanar CT image reconstructions of the maxillofacial structures were also generated.  COMPARISON:  11/19/2013, 09/10/2013  FINDINGS: CT HEAD FINDINGS  There is no evidence of mass effect, midline shift, or extra-axial fluid collections. There is no evidence of a space-occupying lesion or intracranial hemorrhage. There is no evidence of a cortical-based area of acute infarction. There  is generalized cerebral atrophy. There is periventricular white matter low attenuation likely secondary to microangiopathy.  The ventricles and sulci are appropriate for the patient's age. The basal cisterns are patent.  Visualized portions of the orbits are unremarkable. The visualized portions of the paranasal sinuses and mastoid air cells are unremarkable. Cerebrovascular atherosclerotic calcifications are noted.  The osseous structures are unremarkable.  CT MAXILLOFACIAL FINDINGS  The globes are intact. The orbital walls are intact. The orbital floors are intact. The maxilla is intact. The mandible is intact. The zygomatic arches are intact. The nasal septum is midline. There is no nasal  bone fracture. The temporomandibular joints are normal.  There is evidence of prior nasal sinus surgery. The paranasal sinuses are clear. The visualized portions of the mastoid sinuses are well aerated.  There is bilateral facet arthropathy at C2-3, C3-4 and C4-5.  IMPRESSION: 1. No acute intracranial pathology. 2. No acute osseous injury of the maxillofacial bones. 3. Mild generalized cerebral atrophy and chronic microvascular disease.   Electronically Signed   By: Kathreen Devoid   On: 01/02/2014 15:20     EKG Interpretation None      MDM   Final diagnoses:  Headache  Neuralgia  Trigeminal neuralgia    Patient's area of pain seems to follow the first and second distributions of the trigeminal nerve. She does not have rash or vesicles to suggest zoster. She does have skin sensitivity. Normal CT. Normal sedimentation rate, thus doubt temporal arteritis. Pain could be trigeminal neuralgia and unrelated to her fall. Could be posttraumatic. Plan is nightly single-dose Neurontin and primary care follow-up for tolerance and results. Recheck with any vesicles or clinical signs of shingles. This was discussed at length with the patient.    Tanna Furry, MD 01/02/14 1600

## 2014-01-02 NOTE — ED Notes (Signed)
Pt here for continued issues after fall on November 4th; pt seen for same and had negative CT; pt sts continued dull pain and concern about pain per pt

## 2014-01-02 NOTE — Discharge Instructions (Signed)
CT shows no abnormalities from your fall. Labs screening for temporal arteritis are normal. The pain could be posttraumatic from your fall, or related to trigeminal neuralgia (see information). If you see rash or redness across her head or face this could represent shingles, follow-up with your physician. Prescription given for low dose of Neurontin to take only at night, which can help nerve-based pain.   Neuropathic Pain We often think that pain has a physical cause. If we get rid of the cause, the pain should go away. Nerves themselves can also cause pain. It is called neuropathic pain, which means nerve abnormality. It may be difficult for the patients who have it and for the treating caregivers. Pain is usually described as acute (short-lived) or chronic (long-lasting). Acute pain is related to the physical sensations caused by an injury. It can last from a few seconds to many weeks, but it usually goes away when normal healing occurs. Chronic pain lasts beyond the typical healing time. With neuropathic pain, the nerve fibers themselves may be damaged or injured. They then send incorrect signals to other pain centers. The pain you feel is real, but the cause is not easy to find.  CAUSES  Chronic pain can result from diseases, such as diabetes and shingles (an infection related to chickenpox), or from trauma, surgery, or amputation. It can also happen without any known injury or disease. The nerves are sending pain messages, even though there is no identifiable cause for such messages.   Other common causes of neuropathy include diabetes, phantom limb pain, or Regional Pain Syndrome (RPS).  As with all forms of chronic back pain, if neuropathy is not correctly treated, there can be a number of associated problems that lead to a downward cycle for the patient. These include depression, sleeplessness, feelings of fear and anxiety, limited social interaction and inability to do normal daily activities  or work.  The most dramatic and mysterious example of neuropathic pain is called "phantom limb syndrome." This occurs when an arm or a leg has been removed because of illness or injury. The brain still gets pain messages from the nerves that originally carried impulses from the missing limb. These nerves now seem to misfire and cause troubling pain.  Neuropathic pain often seems to have no cause. It responds poorly to standard pain treatment. Neuropathic pain can occur after:  Shingles (herpes zoster virus infection).  A lasting burning sensation of the skin, caused usually by injury to a peripheral nerve.  Peripheral neuropathy which is widespread nerve damage, often caused by diabetes or alcoholism.  Phantom limb pain following an amputation.  Facial nerve problems (trigeminal neuralgia).  Multiple sclerosis.  Reflex sympathetic dystrophy.  Pain which comes with cancer and cancer chemotherapy.  Entrapment neuropathy such as when pressure is put on a nerve such as in carpal tunnel syndrome.  Back, leg, and hip problems (sciatica).  Spine or back surgery.  HIV Infection or AIDS where nerves are infected by viruses. Your caregiver can explain items in the above list which may apply to you. SYMPTOMS  Characteristics of neuropathic pain are:  Severe, sharp, electric shock-like, shooting, lightening-like, knife-like.  Pins and needles sensation.  Deep burning, deep cold, or deep ache.  Persistent numbness, tingling, or weakness.  Pain resulting from light touch or other stimulus that would not usually cause pain.  Increased sensitivity to something that would normally cause pain, such as a pinprick. Pain may persist for months or years following the healing of damaged  tissues. When this happens, pain signals no longer sound an alarm about current injuries or injuries about to happen. Instead, the alarm system itself is not working correctly.  Neuropathic pain may get worse  instead of better over time. For some people, it can lead to serious disability. It is important to be aware that severe injury in a limb can occur without a proper, protective pain response.Burns, cuts, and other injuries may go unnoticed. Without proper treatment, these injuries can become infected or lead to further disability. Take any injury seriously, and consult your caregiver for treatment. DIAGNOSIS  When you have a pain with no known cause, your caregiver will probably ask some specific questions:   Do you have any other conditions, such as diabetes, shingles, multiple sclerosis, or HIV infection?  How would you describe your pain? (Neuropathic pain is often described as shooting, stabbing, burning, or searing.)  Is your pain worse at any time of the day? (Neuropathic pain is usually worse at night.)  Does the pain seem to follow a certain physical pathway?  Does the pain come from an area that has missing or injured nerves? (An example would be phantom limb pain.)  Is the pain triggered by minor things such as rubbing against the sheets at night? These questions often help define the type of pain involved. Once your caregiver knows what is happening, treatment can begin. Anticonvulsant, antidepressant drugs, and various pain relievers seem to work in some cases. If another condition, such as diabetes is involved, better management of that disorder may relieve the neuropathic pain.  TREATMENT  Neuropathic pain is frequently long-lasting and tends not to respond to treatment with narcotic type pain medication. It may respond well to other drugs such as antiseizure and antidepressant medications. Usually, neuropathic problems do not completely go away, but partial improvement is often possible with proper treatment. Your caregivers have large numbers of medications available to treat you. Do not be discouraged if you do not get immediate relief. Sometimes different medications or a  combination of medications will be tried before you receive the results you are hoping for. See your caregiver if you have pain that seems to be coming from nowhere and does not go away. Help is available.  SEEK IMMEDIATE MEDICAL CARE IF:   There is a sudden change in the quality of your pain, especially if the change is on only one side of the body.  You notice changes of the skin, such as redness, black or purple discoloration, swelling, or an ulcer.  You cannot move the affected limbs. Document Released: 09/29/2003 Document Revised: 03/26/2011 Document Reviewed: 09/29/2003 [facility] Patient Information 2015 [place], [place]. This information is not intended to replace advice given to you by your health care provider. Make sure you discuss any questions you have with your health care provider.  Trigeminal Neuralgia Trigeminal neuralgia is a nerve disorder that causes sudden attacks of severe facial pain. It is caused by damage to the trigeminal nerve, a major nerve in the face. It is more common in women and in the elderly, although it can also happen in younger patients. Attacks last from a few seconds to several minutes and can occur from a couple of times per year to several times per day. Trigeminal neuralgia can be a very distressing and disabling condition. Surgery may be needed in very severe cases if medical treatment does not give relief. HOME CARE INSTRUCTIONS   If your caregiver prescribed medication to help prevent attacks, take as  directed.  To help prevent attacks:  Chew on the unaffected side of the mouth.  Avoid touching your face.  Avoid blasts of hot or cold air.  Men may wish to grow a beard to avoid having to shave. SEEK IMMEDIATE MEDICAL CARE IF:  Pain is unbearable and your medicine does not help.  You develop new, unexplained symptoms (problems).  You have problems that may be related to a medication you are taking. Document Released: 12/30/1999 Document  Revised: 03/26/2011 Document Reviewed: 10/29/2008 [facility] Patient Information 2015 [place], [place]. This information is not intended to replace advice given to you by your health care provider. Make sure you discuss any questions you have with your health care provider.

## 2014-01-20 ENCOUNTER — Encounter: Payer: Self-pay | Admitting: Diagnostic Neuroimaging

## 2014-01-20 ENCOUNTER — Ambulatory Visit (INDEPENDENT_AMBULATORY_CARE_PROVIDER_SITE_OTHER): Payer: Commercial Managed Care - HMO | Admitting: Diagnostic Neuroimaging

## 2014-01-20 VITALS — BP 146/79 | HR 72 | Temp 97.7°F | Ht 63.0 in | Wt 171.2 lb

## 2014-01-20 DIAGNOSIS — F0781 Postconcussional syndrome: Secondary | ICD-10-CM

## 2014-01-20 DIAGNOSIS — G609 Hereditary and idiopathic neuropathy, unspecified: Secondary | ICD-10-CM

## 2014-01-20 NOTE — Patient Instructions (Signed)
I will check additional testing. 

## 2014-01-20 NOTE — Progress Notes (Signed)
[facility]  PATIENT: [name] DOB: 1929/04/11  REFERRING CLINICIAN: Avva HISTORY FROM: patient  REASON FOR VISIT: new consult    HISTORICAL  CHIEF COMPLAINT:  Chief Complaint  Patient presents with  . Pain    Head     HISTORY OF PRESENT ILLNESS:   81 year old right-handed female with hypercholesterolemia, breast cancer, here for evaluation of right scalp and facial discomfort, gait difficulty, postconcussion syndrome. 11/18/2013 patient tripped on her driveway, fell and possibly hit her car in the driveway. She then bounced backwards and landed on her left hip. She had significant left hip pain but didn't feel any had pain initially. After going inside should be developed dizziness clamminess, possible low blood pressure and low pulse rate, and went to the emergency room. Patient was diagnosed with concussion. Since that time she has continued discomfort in her right scalp, especially when her hair is minimally dilated. She has pain that radiates down to her right facial region. Patient has had ongoing intermittent pain, went to the emergency room second time, diagnosed with possible trigeminal neuralgia.  Also with remote shingles in the right frontal V1 region many years ago.   Patient also having progressive gait and balance difficulty, with at least 5-6 years of numbness and tingling in the toes. Apparently she has been diagnosed with neuropathy, but without specific cause.    REVIEW OF SYSTEMS: Full 14 system review of systems performed and notable only for trouble swallowing incontinence allergies joint pain headache numbness difficulty swallowing insomnia depression decreased energy disinterest in activities.  ALLERGIES: Allergies  Allergen Reactions  . Dust Mite Extract   . Other     Smoke, Cat Dander    HOME MEDICATIONS: Outpatient Prescriptions Prior to Visit  Medication Sig Dispense Refill  . aspirin 81 MG tablet Take 81 mg by  mouth daily.    . calcium-vitamin D (OSCAL WITH D) 500-200 MG-UNIT per tablet Take 1 tablet by mouth 2 (two) times daily.     . fexofenadine (ALLEGRA) 180 MG tablet Take 180 mg by mouth every other day.     . fish oil-omega-3 fatty acids 1000 MG capsule Take 1 g by mouth 2 (two) times daily.     Marland Kitchen GLUCOSAMINE-CHONDROITIN PO Take 1 tablet by mouth daily.    Marland Kitchen LORazepam (ATIVAN) 0.5 MG tablet Take 0.5 mg by mouth 2 (two) times daily as needed.    . Multiple Vitamin (MULTIVITAMIN) capsule Take 1 capsule by mouth daily.    . nabumetone (RELAFEN) 750 MG tablet Take 750 mg by mouth 2 (two) times daily.     . nortriptyline (PAMELOR) 25 MG capsule Take 25 mg by mouth at bedtime.    . raloxifene (EVISTA) 60 MG tablet Take 60 mg by mouth daily.    . traMADol (ULTRAM) 50 MG tablet Take 50 mg by mouth every 6 (six) hours as needed.    . vitamin B-12 (CYANOCOBALAMIN) 1000 MCG tablet Take 1,000 mcg by mouth daily.    Marland Kitchen zolpidem (AMBIEN) 10 MG tablet Take 3 mg by mouth at bedtime as needed for sleep.    Marland Kitchen amitriptyline (ELAVIL) 25 MG tablet Take 25 mg by mouth at bedtime.    . cholecalciferol (VITAMIN D) 1000 UNITS tablet Take 1,000 Units by mouth daily.    Marland Kitchen donepezil (ARICEPT) 10 MG tablet Take 3-10 mg by mouth at bedtime as needed (for sleep, patient usually takes a third of a tablet).    Marland Kitchen esomeprazole (NEXIUM) 20 MG  capsule Take 20 mg by mouth daily at 12 noon.    Marland Kitchen esomeprazole (NEXIUM) 40 MG capsule Take 1 capsule (40 mg total) by mouth daily before breakfast. 90 capsule 3  . gabapentin (NEURONTIN) 300 MG capsule Take 1 capsule (300 mg total) by mouth at bedtime. 30 capsule 0  . meclizine (ANTIVERT) 25 MG tablet Take 1 tablet (25 mg total) by mouth 3 (three) times daily as needed for dizziness. (Patient not taking: Reported on 01/02/2014) 30 tablet 0  . Multiple Vitamin (MULTIVITAMIN WITH MINERALS) TABS tablet Take 1 tablet by mouth daily.    . psyllium (METAMUCIL SMOOTH TEXTURE) 58.6 % powder Mix one  scoop in 8 ounces of water or juice daily 283 g 12  . traMADol (ULTRAM-ER) 100 MG 24 hr tablet Take 100-200 mg by mouth daily.     No facility-administered medications prior to visit.    PAST MEDICAL HISTORY: Past Medical History  Diagnosis Date  . Diverticulosis   . Allergic rhinitis   . Hyperlipidemia   . Headache(784.0)     sinus HAs  . Breast cancer     left breast cancer  . Arthritis     PAST SURGICAL HISTORY: Past Surgical History  Procedure Laterality Date  . Total knee arthroplasty      right  . Nasal sinus surgery      2004  . Replacement total knee      left  . Abdominal hysterectomy    . Mastectomy      left  . Shoulder surgery Bilateral     repairs  . Esophagogastroduodenoscopy N/A 08/15/2012    Procedure: ESOPHAGOGASTRODUODENOSCOPY (EGD);  Surgeon: Inda Castle, MD;  Location: [facility];  Service: Endoscopy;  Laterality: N/A;    FAMILY HISTORY: Family History  Problem Relation Age of Onset  . Colon cancer Father 6  . Breast cancer Mother   . Pancreatic cancer Sister   . Leukemia Son     SOCIAL HISTORY:  History   Social History  . Marital Status: Married    Spouse Name: Doren Custard    Number of Children: 3  . Years of Education: MBA   Occupational History  . Retired    Social History Main Topics  . Smoking status: Former Smoker -- 0.50 packs/day for 6 years    Types: Cigarettes    Quit date: 01/15/1958  . Smokeless tobacco: Never Used  . Alcohol Use: 0.0 oz/week    0 Not specified per week     Comment: bourbon nightly, wine every other night  . Drug Use: No  . Sexual Activity: Not on file   Other Topics Concern  . Not on file   Social History Narrative   Patient lives at home with spouse.   Caffeine use: none   3 children; 1 deceased     PHYSICAL EXAM  Filed Vitals:   01/20/14 1114  BP: 146/79  Pulse: 72  Temp: 97.7 F (36.5 C)  TempSrc: Oral  Height: 5\' 3"  (1.6 m)  Weight: 171 lb 3.2 oz (77.656 kg)    Body  mass index is 30.33 kg/(m^2).   Visual Acuity Screening   Right eye Left eye Both eyes  Without correction: 20/50 20/40   With correction:       No flowsheet data found.  GENERAL EXAM: Patient is in no distress; well developed, nourished and groomed; neck is supple; MILD HIGH ARCH AND HAMMERTOES IN LEFT FOOT  CARDIOVASCULAR: Regular rate and rhythm, no murmurs, no  carotid bruits  NEUROLOGIC: MENTAL STATUS: awake, alert, oriented to person, place and time, recent and remote memory intact, normal attention and concentration, language fluent, comprehension intact, naming intact, fund of knowledge appropriate CRANIAL NERVE: no papilledema on fundoscopic exam, pupils equal and reactive to light, visual fields full to confrontation, extraocular muscles intact, no nystagmus, facial sensation and strength symmetric, hearing intact, palate elevates symmetrically, uvula midline, shoulder shrug symmetric, tongue midline. MOTOR: normal bulk and tone, full strength in the BUE, BLE; EXCEPT DF SLIGHTLY WEAK IN BILATERAL FEET SENSORY: DECR PP, LT, TEMP IN FEET; ABSENT VIB AT TOES AND ANKLES. DECR PROPRIO IN LEFT TOES COORDINATION: finger-nose-finger, fine finger movements normal REFLEXES: deep tendon reflexes present and symmetric GAIT/STATION: narrow based gait; SCUFFS FEET AT TIMES. SLOW UNSTEADY TURNING. CANNOT TOE, HEEL OR TANDEM; UNSTEADY ON ROMBERG, BUT DOESN'T TAKE A STEP.    DIAGNOSTIC DATA (LABS, IMAGING, TESTING) - I reviewed patient records, labs, notes, testing and imaging myself where available.  Lab Results  Component Value Date   WBC 9.1 11/19/2013   HGB 13.5 11/19/2013   HCT 40.2 11/19/2013   MCV 89.9 11/19/2013   PLT 267 11/19/2013      Component Value Date/Time   NA 139 11/19/2013 0531   K 6.2* 11/19/2013 0531   CL 102 11/19/2013 0531   CO2 25 11/19/2013 0531   GLUCOSE 110* 11/19/2013 0531   BUN 21 11/19/2013 0531   CREATININE 0.70 11/19/2013 0531   CALCIUM 9.3  11/19/2013 0531   PROT 6.5 11/19/2013 0531   ALBUMIN 3.4* 11/19/2013 0531   AST 48* 11/19/2013 0531   ALT 26 11/19/2013 0531   ALKPHOS 52 11/19/2013 0531   BILITOT 0.4 11/19/2013 0531   GFRNONAA 79* 11/19/2013 0531   GFRAA >90 11/19/2013 0531   No results found for: CHOL, HDL, LDLCALC, LDLDIRECT, TRIG, CHOLHDL No results found for: HGBA1C No results found for: VITAMINB12 No results found for: TSH   I reviewed images myself and agree with interpretation. -VRP  01/02/14 CT head / maxillofacial 1. No acute intracranial pathology. 2. No acute osseous injury of the maxillofacial bones. 3. Mild generalized cerebral atrophy and chronic microvascular disease.  09/10/13 CTA head/neck 1. Negative for age CTA head and neck. 2. Stable CT appearance of the brain.     ASSESSMENT AND PLAN  81 y.o. year old female here with postconcussion syndrome with resultant scalp numbness/sensitivity, facial pain. Also with suspected underlying peripheral neuropathy for past 5-6 years, without clear etiology.  Dx: post-concussion syndrome + neuropathy  PLAN: - neuropathy workup - caution with gait and balance   Return in about 3 months (around 04/21/2014).    Penni Bombard, MD 03/17/2023, 42:70 PM Certified in Neurology, Neurophysiology and Neuroimaging  [facility] [address], [place], [place] [address] [phone]

## 2014-01-22 ENCOUNTER — Other Ambulatory Visit (HOSPITAL_COMMUNITY): Payer: Self-pay | Admitting: Internal Medicine

## 2014-01-22 ENCOUNTER — Other Ambulatory Visit: Payer: Self-pay

## 2014-01-22 ENCOUNTER — Ambulatory Visit (HOSPITAL_COMMUNITY): Payer: Commercial Managed Care - HMO | Attending: Cardiovascular Disease | Admitting: Radiology

## 2014-01-22 DIAGNOSIS — R011 Cardiac murmur, unspecified: Secondary | ICD-10-CM | POA: Insufficient documentation

## 2014-01-22 LAB — IFE AND PE, SERUM
ALBUMIN/GLOB SERPL: 1.5 (ref 0.7–2.0)
ALPHA2 GLOB SERPL ELPH-MCNC: 0.6 g/dL (ref 0.4–1.2)
Albumin SerPl Elph-Mcnc: 3.9 g/dL (ref 3.2–5.6)
Alpha 1: 0.3 g/dL (ref 0.1–0.4)
B-Globulin SerPl Elph-Mcnc: 1.1 g/dL (ref 0.6–1.3)
GAMMA GLOB SERPL ELPH-MCNC: 0.7 g/dL (ref 0.5–1.6)
Globulin, Total: 2.7 g/dL (ref 2.0–4.5)
IGG (IMMUNOGLOBIN G), SERUM: 845 mg/dL (ref 700–1600)
IGM (IMMUNOGLOBULIN M), SRM: 43 mg/dL (ref 40–230)
IgA/Immunoglobulin A, Serum: 269 mg/dL (ref 91–414)

## 2014-01-22 LAB — NEUROPATHY PANEL
ANGIO CONVERT ENZYME: 36 U/L (ref 14–82)
Anti Nuclear Antibody(ANA): NEGATIVE
RHEUMATOID FACTOR: 10.1 [IU]/mL (ref 0.0–13.9)
SED RATE: 4 mm/h (ref 0–40)
TOTAL PROTEIN: 6.6 g/dL (ref 6.0–8.5)
TSH: 1.2 u[IU]/mL (ref 0.450–4.500)
Vit D, 25-Hydroxy: 45.3 ng/mL (ref 30.0–100.0)

## 2014-01-22 LAB — HEMOGLOBIN A1C
ESTIMATED AVERAGE GLUCOSE: 117 mg/dL
Hgb A1c MFr Bld: 5.7 % — ABNORMAL HIGH (ref 4.8–5.6)

## 2014-01-22 NOTE — Progress Notes (Signed)
Echocardiogram performed.  

## 2014-01-27 ENCOUNTER — Encounter: Payer: Self-pay | Admitting: Diagnostic Neuroimaging

## 2014-02-01 MED ORDER — GABAPENTIN 300 MG PO CAPS: 300.0000 mg | ORAL_CAPSULE | Freq: Three times a day (TID) | ORAL | Status: DC

## 2014-02-10 DIAGNOSIS — M75122 Complete rotator cuff tear or rupture of left shoulder, not specified as traumatic: Secondary | ICD-10-CM | POA: Diagnosis not present

## 2014-02-10 DIAGNOSIS — M19012 Primary osteoarthritis, left shoulder: Secondary | ICD-10-CM | POA: Diagnosis not present

## 2014-02-16 DIAGNOSIS — H02843 Edema of right eye, unspecified eyelid: Secondary | ICD-10-CM | POA: Diagnosis not present

## 2014-02-16 DIAGNOSIS — S60911A Unspecified superficial injury of right wrist, initial encounter: Secondary | ICD-10-CM | POA: Diagnosis not present

## 2014-02-16 DIAGNOSIS — S0993XA Unspecified injury of face, initial encounter: Secondary | ICD-10-CM | POA: Diagnosis not present

## 2014-02-16 DIAGNOSIS — R2231 Localized swelling, mass and lump, right upper limb: Secondary | ICD-10-CM | POA: Diagnosis not present

## 2014-02-16 DIAGNOSIS — W1839XA Other fall on same level, initial encounter: Secondary | ICD-10-CM | POA: Diagnosis not present

## 2014-02-16 DIAGNOSIS — H1131 Conjunctival hemorrhage, right eye: Secondary | ICD-10-CM | POA: Diagnosis not present

## 2014-02-16 DIAGNOSIS — H02846 Edema of left eye, unspecified eyelid: Secondary | ICD-10-CM | POA: Diagnosis not present

## 2014-02-16 DIAGNOSIS — H5451 Low vision, right eye, normal vision left eye: Secondary | ICD-10-CM | POA: Diagnosis not present

## 2014-02-17 DIAGNOSIS — R69 Illness, unspecified: Secondary | ICD-10-CM | POA: Diagnosis not present

## 2014-02-17 DIAGNOSIS — H05231 Hemorrhage of right orbit: Secondary | ICD-10-CM | POA: Diagnosis not present

## 2014-02-17 DIAGNOSIS — M25531 Pain in right wrist: Secondary | ICD-10-CM | POA: Diagnosis not present

## 2014-02-17 DIAGNOSIS — S6291XA Unspecified fracture of right wrist and hand, initial encounter for closed fracture: Secondary | ICD-10-CM | POA: Diagnosis not present

## 2014-02-17 DIAGNOSIS — S52551A Other extraarticular fracture of lower end of right radius, initial encounter for closed fracture: Secondary | ICD-10-CM | POA: Diagnosis not present

## 2014-02-17 DIAGNOSIS — W0110XA Fall on same level from slipping, tripping and stumbling with subsequent striking against unspecified object, initial encounter: Secondary | ICD-10-CM | POA: Diagnosis not present

## 2014-02-17 DIAGNOSIS — Z6829 Body mass index (BMI) 29.0-29.9, adult: Secondary | ICD-10-CM | POA: Diagnosis not present

## 2014-02-24 DIAGNOSIS — Z6829 Body mass index (BMI) 29.0-29.9, adult: Secondary | ICD-10-CM | POA: Diagnosis not present

## 2014-02-24 DIAGNOSIS — H05231 Hemorrhage of right orbit: Secondary | ICD-10-CM | POA: Diagnosis not present

## 2014-02-24 DIAGNOSIS — W0110XA Fall on same level from slipping, tripping and stumbling with subsequent striking against unspecified object, initial encounter: Secondary | ICD-10-CM | POA: Diagnosis not present

## 2014-02-24 DIAGNOSIS — S6291XA Unspecified fracture of right wrist and hand, initial encounter for closed fracture: Secondary | ICD-10-CM | POA: Diagnosis not present

## 2014-02-26 DIAGNOSIS — S52551A Other extraarticular fracture of lower end of right radius, initial encounter for closed fracture: Secondary | ICD-10-CM | POA: Diagnosis not present

## 2014-02-26 DIAGNOSIS — M25531 Pain in right wrist: Secondary | ICD-10-CM | POA: Diagnosis not present

## 2014-03-02 DIAGNOSIS — R69 Illness, unspecified: Secondary | ICD-10-CM | POA: Diagnosis not present

## 2014-03-04 DIAGNOSIS — W1849XA Other slipping, tripping and stumbling without falling, initial encounter: Secondary | ICD-10-CM | POA: Diagnosis not present

## 2014-03-04 DIAGNOSIS — H05239 Hemorrhage of unspecified orbit: Secondary | ICD-10-CM | POA: Diagnosis not present

## 2014-03-04 DIAGNOSIS — S6290XS Unspecified fracture of unspecified wrist and hand, sequela: Secondary | ICD-10-CM | POA: Diagnosis not present

## 2014-03-04 DIAGNOSIS — R2689 Other abnormalities of gait and mobility: Secondary | ICD-10-CM | POA: Diagnosis not present

## 2014-03-04 DIAGNOSIS — Z6829 Body mass index (BMI) 29.0-29.9, adult: Secondary | ICD-10-CM | POA: Diagnosis not present

## 2014-03-08 ENCOUNTER — Encounter: Payer: Self-pay | Admitting: Diagnostic Neuroimaging

## 2014-03-08 ENCOUNTER — Ambulatory Visit (INDEPENDENT_AMBULATORY_CARE_PROVIDER_SITE_OTHER): Payer: Commercial Managed Care - HMO | Admitting: Diagnostic Neuroimaging

## 2014-03-08 VITALS — BP 135/91 | HR 87 | Ht 64.0 in | Wt 165.0 lb

## 2014-03-08 DIAGNOSIS — R269 Unspecified abnormalities of gait and mobility: Secondary | ICD-10-CM | POA: Diagnosis not present

## 2014-03-08 DIAGNOSIS — F0781 Postconcussional syndrome: Secondary | ICD-10-CM | POA: Diagnosis not present

## 2014-03-08 DIAGNOSIS — G609 Hereditary and idiopathic neuropathy, unspecified: Secondary | ICD-10-CM | POA: Diagnosis not present

## 2014-03-08 MED ORDER — AMITRIPTYLINE HCL 25 MG PO TABS: 25.0000 mg | ORAL_TABLET | Freq: Every day | ORAL | Status: DC

## 2014-03-08 NOTE — Patient Instructions (Addendum)
I will check additional testing.   Stop nortriptyline.  Start amitriptyline 25mg  at bedtime.

## 2014-03-08 NOTE — Progress Notes (Signed)
[facility]  PATIENT: [name] DOB: 07-27-1929  REFERRING CLINICIAN: Avva HISTORY FROM: patient  REASON FOR VISIT: follow up    HISTORICAL  CHIEF COMPLAINT:  Chief Complaint  Patient presents with  . Follow-up    post-concussion syndrome    HISTORY OF PRESENT ILLNESS:   UPDATE 03/08/14: Since last visit, patient was traveling overseas to [place] when she tripped and fell on a concrete slab, falling on her right face and right wrist. Patient suffered significant right periorbital ecchymosis, facial bruising, as well as right forearm fracture. Patient continues to have pain in her right head, headaches, balance difficulty. Patient follows up to further investigate neuropathy.  PRIOR HPI (01/20/14): 81 year old right-handed female with hypercholesterolemia, breast cancer, here for evaluation of right scalp and facial discomfort, gait difficulty, postconcussion syndrome. 11/18/2013 patient tripped on her driveway, fell and possibly hit her car in the driveway. She then bounced backwards and landed on her left hip. She had significant left hip pain but didn't feel any had pain initially. After going inside should be developed dizziness clamminess, possible low blood pressure and low pulse rate, and went to the emergency room. Patient was diagnosed with concussion. Since that time she has continued discomfort in her right scalp, especially when her hair is minimally dilated. She has pain that radiates down to her right facial region. Patient has had ongoing intermittent pain, went to the emergency room second time, diagnosed with possible trigeminal neuralgia. Also with remote shingles in the right frontal V1 region many years ago. Patient also having progressive gait and balance difficulty, with at least 5-6 years of numbness and tingling in the toes. Apparently she has been diagnosed with neuropathy, but without specific cause.   REVIEW OF SYSTEMS: Full 14 system  review of systems performed and notable only for facial swelling cold intolerance headache numbness depression anxiety.   ALLERGIES: Allergies  Allergen Reactions  . Dust Mite Extract   . Other     Smoke, Cat Dander    HOME MEDICATIONS: Outpatient Prescriptions Prior to Visit  Medication Sig Dispense Refill  . acetaminophen (TYLENOL ARTHRITIS PAIN) 650 MG CR tablet Take 650 mg by mouth every 8 (eight) hours as needed for pain.    Marland Kitchen aspirin 81 MG tablet Take 81 mg by mouth daily.    . calcium-vitamin D (OSCAL WITH D) 500-200 MG-UNIT per tablet Take 1 tablet by mouth 2 (two) times daily.     . fexofenadine (ALLEGRA) 180 MG tablet Take 180 mg by mouth every other day.     . fish oil-omega-3 fatty acids 1000 MG capsule Take 1 g by mouth 2 (two) times daily.     Marland Kitchen gabapentin (NEURONTIN) 300 MG capsule Take 1 capsule (300 mg total) by mouth 3 (three) times daily. 30 capsule 3  . LORazepam (ATIVAN) 0.5 MG tablet Take 0.5 mg by mouth 2 (two) times daily as needed.    . Multiple Vitamin (MULTIVITAMIN) capsule Take 1 capsule by mouth daily.    . nabumetone (RELAFEN) 750 MG tablet Take 750 mg by mouth 2 (two) times daily.     . raloxifene (EVISTA) 60 MG tablet Take 60 mg by mouth daily.    . traMADol (ULTRAM) 50 MG tablet Take 50 mg by mouth every 6 (six) hours as needed.    . vitamin B-12 (CYANOCOBALAMIN) 1000 MCG tablet Take 1,000 mcg by mouth daily.    Marland Kitchen zolpidem (AMBIEN) 10 MG tablet Take 3 mg by mouth at  bedtime as needed for sleep.    . nortriptyline (PAMELOR) 25 MG capsule Take 25 mg by mouth at bedtime.    Marland Kitchen GLUCOSAMINE-CHONDROITIN PO Take 1 tablet by mouth daily.     No facility-administered medications prior to visit.    PAST MEDICAL HISTORY: Past Medical History  Diagnosis Date  . Diverticulosis   . Allergic rhinitis   . Hyperlipidemia   . Headache(784.0)     sinus HAs  . Breast cancer     left breast cancer  . Arthritis     PAST SURGICAL HISTORY: Past Surgical History    Procedure Laterality Date  . Total knee arthroplasty      right  . Nasal sinus surgery      2004  . Replacement total knee      left  . Abdominal hysterectomy    . Mastectomy      left  . Shoulder surgery Bilateral     repairs  . Esophagogastroduodenoscopy N/A 08/15/2012    Procedure: ESOPHAGOGASTRODUODENOSCOPY (EGD);  Surgeon: Inda Castle, MD;  Location: [facility];  Service: Endoscopy;  Laterality: N/A;    FAMILY HISTORY: Family History  Problem Relation Age of Onset  . Colon cancer Father 45  . Breast cancer Mother   . Pancreatic cancer Sister   . Leukemia Son     SOCIAL HISTORY:  History   Social History  . Marital Status: Married    Spouse Name: Doren Custard  . Number of Children: 3  . Years of Education: MBA   Occupational History  . Retired    Social History Main Topics  . Smoking status: Former Smoker -- 0.50 packs/day for 6 years    Types: Cigarettes    Quit date: 01/15/1958  . Smokeless tobacco: Never Used  . Alcohol Use: 0.0 oz/week    0 Standard drinks or equivalent per week     Comment: bourbon nightly, wine every other night  . Drug Use: No  . Sexual Activity: Not on file   Other Topics Concern  . Not on file   Social History Narrative   Patient lives at home with spouse.   Caffeine use: none   3 children; 1 deceased     PHYSICAL EXAM  Filed Vitals:   03/08/14 1257  BP: 135/91  Pulse: 87  Height: 5\' 4"  (1.626 m)  Weight: 165 lb (74.844 kg)    Body mass index is 28.31 kg/(m^2).  No exam data present  No flowsheet data found.  GENERAL EXAM: Patient is in no distress; well developed, nourished and groomed; neck is supple; MILD HIGH ARCH AND HAMMERTOES IN LEFT FOOT; SIG ECCHYMOSES IN RIGHT MAXILLARY AND RIGHT MANDIBULAR REGION  CARDIOVASCULAR: Regular rate and rhythm, no murmurs, no carotid bruits  NEUROLOGIC: MENTAL STATUS: awake, alert, language fluent, comprehension intact, naming intact, fund of knowledge  appropriate CRANIAL NERVE: no papilledema on fundoscopic exam, pupils equal and reactive to light, visual fields full to confrontation, extraocular muscles intact, no nystagmus, facial sensation and strength symmetric, hearing intact, palate elevates symmetrically, uvula midline, shoulder shrug symmetric, tongue midline. MOTOR: normal bulk and tone, full strength in the BUE, BLE; EXCEPT DF WEAKNESS (3) IN BILATERAL FEET SENSORY: DECR PP, LT, TEMP IN FEET; ABSENT VIB AT TOES AND ANKLES. DECR PROPRIO IN LEFT TOES COORDINATION: finger-nose-finger, fine finger movements normal REFLEXES: deep tendon reflexes present and symmetric GAIT/STATION: narrow based gait; SCUFFS FEET. SLOW UNSTEADY TURNING. CANNOT TOE, HEEL OR TANDEM; UNSTEADY ON ROMBERG    DIAGNOSTIC DATA (  LABS, IMAGING, TESTING) - I reviewed patient records, labs, notes, testing and imaging myself where available.  Lab Results  Component Value Date   WBC 9.1 11/19/2013   HGB 13.5 11/19/2013   HCT 40.2 11/19/2013   MCV 89.9 11/19/2013   PLT 267 11/19/2013      Component Value Date/Time   NA 139 11/19/2013 0531   K 6.2* 11/19/2013 0531   CL 102 11/19/2013 0531   CO2 25 11/19/2013 0531   GLUCOSE 110* 11/19/2013 0531   BUN 21 11/19/2013 0531   CREATININE 0.70 11/19/2013 0531   CALCIUM 9.3 11/19/2013 0531   PROT 6.6 01/20/2014 1237   PROT 6.5 11/19/2013 0531   ALBUMIN 3.4* 11/19/2013 0531   AST 48* 11/19/2013 0531   ALT 26 11/19/2013 0531   ALKPHOS 52 11/19/2013 0531   BILITOT 0.4 11/19/2013 0531   GFRNONAA 79* 11/19/2013 0531   GFRAA >90 11/19/2013 0531   No results found for: CHOL, HDL, LDLCALC, LDLDIRECT, TRIG, CHOLHDL Lab Results  Component Value Date   HGBA1C 5.7* 01/20/2014   Lab Results  Component Value Date   VITAMINB12 >1999* 01/20/2014   Lab Results  Component Value Date   TSH 1.200 01/20/2014     I reviewed images myself and agree with interpretation. -VRP  01/02/14 CT head / maxillofacial 1. No  acute intracranial pathology. 2. No acute osseous injury of the maxillofacial bones. 3. Mild generalized cerebral atrophy and chronic microvascular disease.  09/10/13 CTA head/neck 1. Negative for age CTA head and neck. 2. Stable CT appearance of the brain.  01/20/14 Neuropathy panel - negative    ASSESSMENT AND PLAN  80 y.o. year old female here with postconcussion syndrome (fall on 11/19/13) with resultant scalp numbness/sensitivity, facial pain. Also with suspected underlying peripheral neuropathy for past 5-6 years, without clear etiology (possible hereditary neuropathy). Now with second fall on Feb 19, 2014, with right facial trauma (no fractures) and right forearm fracture.   Dx: post-concussion syndrome + neuropathy (hereditary vs demyelinating vs lumbar radiculopathy)  PLAN: - MRI lumbar spine, EMG/NCS - PT evaluation - extreme caution with gait and balance; use cane or walker  Orders Placed This Encounter  Procedures  . MR Lumbar Spine Wo Contrast  . Ambulatory referral to Physical Therapy  . NCV with EMG(electromyography)   Return for for NCV/EMG.    Penni Bombard, MD 08/25/5724, 2:03 PM Certified in Neurology, Neurophysiology and Neuroimaging  [facility] [address] [place], [place] [address] [phone]

## 2014-03-12 DIAGNOSIS — S52551D Other extraarticular fracture of lower end of right radius, subsequent encounter for closed fracture with routine healing: Secondary | ICD-10-CM | POA: Diagnosis not present

## 2014-03-12 DIAGNOSIS — M25531 Pain in right wrist: Secondary | ICD-10-CM | POA: Diagnosis not present

## 2014-03-15 DIAGNOSIS — R269 Unspecified abnormalities of gait and mobility: Secondary | ICD-10-CM | POA: Diagnosis not present

## 2014-03-15 DIAGNOSIS — R0781 Pleurodynia: Secondary | ICD-10-CM | POA: Diagnosis not present

## 2014-03-17 ENCOUNTER — Other Ambulatory Visit: Payer: Self-pay | Admitting: Neurology

## 2014-03-17 DIAGNOSIS — R269 Unspecified abnormalities of gait and mobility: Secondary | ICD-10-CM

## 2014-03-17 DIAGNOSIS — G609 Hereditary and idiopathic neuropathy, unspecified: Secondary | ICD-10-CM

## 2014-03-17 DIAGNOSIS — F0781 Postconcussional syndrome: Secondary | ICD-10-CM

## 2014-03-19 ENCOUNTER — Encounter (INDEPENDENT_AMBULATORY_CARE_PROVIDER_SITE_OTHER): Payer: Self-pay | Admitting: Diagnostic Neuroimaging

## 2014-03-19 ENCOUNTER — Ambulatory Visit (INDEPENDENT_AMBULATORY_CARE_PROVIDER_SITE_OTHER): Payer: Commercial Managed Care - HMO | Admitting: Diagnostic Neuroimaging

## 2014-03-19 DIAGNOSIS — F0781 Postconcussional syndrome: Secondary | ICD-10-CM

## 2014-03-19 DIAGNOSIS — R269 Unspecified abnormalities of gait and mobility: Secondary | ICD-10-CM

## 2014-03-19 DIAGNOSIS — G609 Hereditary and idiopathic neuropathy, unspecified: Secondary | ICD-10-CM | POA: Diagnosis not present

## 2014-03-19 DIAGNOSIS — Z0289 Encounter for other administrative examinations: Secondary | ICD-10-CM

## 2014-03-19 NOTE — Procedures (Signed)
   [facility]  NCS (NERVE CONDUCTION STUDY) WITH EMG (ELECTROMYOGRAPHY) REPORT   STUDY DATE: 03/19/14 PATIENT NAME: [name] DOB: 05/31/1929 MRN: [id number]  ORDERING CLINICIAN: Andrey Spearman, MD   TECHNOLOGIST: Laretta Alstrom ELECTROMYOGRAPHER: Earlean Polka. Penumalli, MD  CLINICAL INFORMATION: 81 year old female with numbness and balance difficulty.   FINDINGS: NERVE CONDUCTION STUDY: Left median motor response is normal. Bilateral peroneal motor responses with recording over the extensor digitorum brevis muscles could not be obtained. Bilateral peroneal motor responses with recording over the tibialis anterior muscles demonstrates decreased amplitudes and normal conduction velocities.  Right tibial motor response has prolonged distal latency, decreased amplitude, normal conduction velocity and absent F wave latency.  Left tibial motor response has decreased amplitude and normal conduction velocity.  Bilateral H reflex response could not be obtained.  Left median sensory response is normal. Bilateral sural and bilateral peroneal sensory responses could not be obtained.   NEEDLE ELECTROMYOGRAPHY: Left lower extremity: Vastus medialis is normal. Left tibialis anterior and left gastrocnemius muscles have 2+ positive sharp waves and fibrillation potentials at rest and decreased motor unit recruitment on exertion (with large-size motor units).  Bilateral L5-S1 paraspinal muscles demonstrate 1+ positive sharp waves of fibrillar potentials at rest.   IMPRESSION:  Abnormal study demonstrating: 1. Severe length dependent axonal sensorimotor polyneuropathy. 2. Bilateral lower lumbar radiculopathies.    INTERPRETING PHYSICIAN:  Penni Bombard, MD Certified in Neurology, Neurophysiology and Neuroimaging  [facility] [address], [place], [place] [address] [phone]

## 2014-03-22 ENCOUNTER — Encounter: Payer: Self-pay | Admitting: Diagnostic Neuroimaging

## 2014-03-22 ENCOUNTER — Telehealth: Payer: Self-pay | Admitting: Diagnostic Neuroimaging

## 2014-03-22 ENCOUNTER — Encounter: Payer: Commercial Managed Care - HMO | Admitting: Neurology

## 2014-03-22 ENCOUNTER — Other Ambulatory Visit: Payer: Self-pay | Admitting: *Deleted

## 2014-03-22 DIAGNOSIS — R2681 Unsteadiness on feet: Secondary | ICD-10-CM

## 2014-03-22 DIAGNOSIS — S52551D Other extraarticular fracture of lower end of right radius, subsequent encounter for closed fracture with routine healing: Secondary | ICD-10-CM | POA: Diagnosis not present

## 2014-03-22 NOTE — Telephone Encounter (Signed)
Samantha, pls take care of this.  Wells Guiles, in future, pls forward these requests to [facility].  -VRP

## 2014-03-22 NOTE — Telephone Encounter (Signed)
Pt husband called said pt was referred to physical therapy next door at [facility] but has found another facility that is better with their insurance called [facility]. Would like to be referred there. Call pt husband. Thanks dg

## 2014-03-23 ENCOUNTER — Ambulatory Visit: Payer: Commercial Managed Care - HMO

## 2014-03-26 DIAGNOSIS — R2681 Unsteadiness on feet: Secondary | ICD-10-CM | POA: Diagnosis not present

## 2014-03-27 ENCOUNTER — Other Ambulatory Visit: Payer: Self-pay

## 2014-03-27 MED ORDER — GABAPENTIN 300 MG PO CAPS: 300.0000 mg | ORAL_CAPSULE | Freq: Three times a day (TID) | ORAL | Status: DC

## 2014-03-29 DIAGNOSIS — H00015 Hordeolum externum left lower eyelid: Secondary | ICD-10-CM | POA: Diagnosis not present

## 2014-03-29 DIAGNOSIS — H00025 Hordeolum internum left lower eyelid: Secondary | ICD-10-CM | POA: Diagnosis not present

## 2014-03-29 DIAGNOSIS — Z6829 Body mass index (BMI) 29.0-29.9, adult: Secondary | ICD-10-CM | POA: Diagnosis not present

## 2014-03-30 DIAGNOSIS — R2681 Unsteadiness on feet: Secondary | ICD-10-CM | POA: Diagnosis not present

## 2014-04-02 DIAGNOSIS — R2681 Unsteadiness on feet: Secondary | ICD-10-CM | POA: Diagnosis not present

## 2014-04-06 DIAGNOSIS — R2681 Unsteadiness on feet: Secondary | ICD-10-CM | POA: Diagnosis not present

## 2014-04-07 DIAGNOSIS — H0289 Other specified disorders of eyelid: Secondary | ICD-10-CM | POA: Diagnosis not present

## 2014-04-07 DIAGNOSIS — H0015 Chalazion left lower eyelid: Secondary | ICD-10-CM | POA: Diagnosis not present

## 2014-04-07 DIAGNOSIS — H04123 Dry eye syndrome of bilateral lacrimal glands: Secondary | ICD-10-CM | POA: Diagnosis not present

## 2014-04-08 DIAGNOSIS — S52551D Other extraarticular fracture of lower end of right radius, subsequent encounter for closed fracture with routine healing: Secondary | ICD-10-CM | POA: Diagnosis not present

## 2014-04-09 DIAGNOSIS — R2681 Unsteadiness on feet: Secondary | ICD-10-CM | POA: Diagnosis not present

## 2014-04-13 DIAGNOSIS — L720 Epidermal cyst: Secondary | ICD-10-CM | POA: Diagnosis not present

## 2014-04-15 DIAGNOSIS — M7502 Adhesive capsulitis of left shoulder: Secondary | ICD-10-CM | POA: Diagnosis not present

## 2014-04-15 DIAGNOSIS — M75122 Complete rotator cuff tear or rupture of left shoulder, not specified as traumatic: Secondary | ICD-10-CM | POA: Diagnosis not present

## 2014-04-15 DIAGNOSIS — R2681 Unsteadiness on feet: Secondary | ICD-10-CM | POA: Diagnosis not present

## 2014-04-19 DIAGNOSIS — R2681 Unsteadiness on feet: Secondary | ICD-10-CM | POA: Diagnosis not present

## 2014-04-21 DIAGNOSIS — R2681 Unsteadiness on feet: Secondary | ICD-10-CM | POA: Diagnosis not present

## 2014-04-27 DIAGNOSIS — R2681 Unsteadiness on feet: Secondary | ICD-10-CM | POA: Diagnosis not present

## 2014-04-28 ENCOUNTER — Ambulatory Visit (INDEPENDENT_AMBULATORY_CARE_PROVIDER_SITE_OTHER): Payer: Commercial Managed Care - HMO | Admitting: Diagnostic Neuroimaging

## 2014-04-28 ENCOUNTER — Encounter: Payer: Self-pay | Admitting: Diagnostic Neuroimaging

## 2014-04-28 VITALS — BP 120/80 | HR 70 | Ht 63.0 in | Wt 166.8 lb

## 2014-04-28 DIAGNOSIS — G609 Hereditary and idiopathic neuropathy, unspecified: Secondary | ICD-10-CM | POA: Diagnosis not present

## 2014-04-28 DIAGNOSIS — M5416 Radiculopathy, lumbar region: Secondary | ICD-10-CM

## 2014-04-28 NOTE — Patient Instructions (Signed)
Keep up the great work!

## 2014-04-28 NOTE — Progress Notes (Signed)
[facility]  PATIENT: [name] DOB: May 07, 1929  REFERRING CLINICIAN: Avva HISTORY FROM: patient  REASON FOR VISIT: follow up    HISTORICAL  CHIEF COMPLAINT:  Chief Complaint  Patient presents with  . Follow-up    peripheral neuropathy    HISTORY OF PRESENT ILLNESS:   UPDATE 04/28/14: Since last vist doing much better with PT evaluation and exercises ([facility]). She is pleased with progress. She feels stronger and and safer with her balance.  UPDATE 03/08/14: Since last visit, patient was traveling overseas to [place] when she tripped and fell on a concrete slab, falling on her right face and right wrist. Patient suffered significant right periorbital ecchymosis, facial bruising, as well as right forearm fracture. Patient continues to have pain in her right head, headaches, balance difficulty. Patient follows up to further investigate neuropathy.  PRIOR HPI (01/20/14): 81 year old right-handed female with hypercholesterolemia, breast cancer, here for evaluation of right scalp and facial discomfort, gait difficulty, postconcussion syndrome. 11/18/2013 patient tripped on her driveway, fell and possibly hit her car in the driveway. She then bounced backwards and landed on her left hip. She had significant left hip pain but didn't feel any had pain initially. After going inside should be developed dizziness clamminess, possible low blood pressure and low pulse rate, and went to the emergency room. Patient was diagnosed with concussion. Since that time she has continued discomfort in her right scalp, especially when her hair is minimally dilated. She has pain that radiates down to her right facial region. Patient has had ongoing intermittent pain, went to the emergency room second time, diagnosed with possible trigeminal neuralgia. Also with remote shingles in the right frontal V1 region many years ago. Patient also having progressive gait and balance  difficulty, with at least 5-6 years of numbness and tingling in the toes. Apparently she has been diagnosed with neuropathy, but without specific cause.   REVIEW OF SYSTEMS: Full 14 system review of systems performed and notable only for eye discharge fatigue.   ALLERGIES: Allergies  Allergen Reactions  . Dust Mite Extract   . Other     Smoke, Cat Dander    HOME MEDICATIONS: Outpatient Prescriptions Prior to Visit  Medication Sig Dispense Refill  . acetaminophen (TYLENOL ARTHRITIS PAIN) 650 MG CR tablet Take 650 mg by mouth every 8 (eight) hours as needed for pain.    Marland Kitchen aspirin 81 MG tablet Take 81 mg by mouth daily.    . calcium-vitamin D (OSCAL WITH D) 500-200 MG-UNIT per tablet Take 1 tablet by mouth 2 (two) times daily.     . fexofenadine (ALLEGRA) 180 MG tablet Take 180 mg by mouth every other day.     . fish oil-omega-3 fatty acids 1000 MG capsule Take 1 g by mouth 2 (two) times daily.     Marland Kitchen gabapentin (NEURONTIN) 300 MG capsule Take 1 capsule (300 mg total) by mouth 3 (three) times daily. 270 capsule 1  . LORazepam (ATIVAN) 0.5 MG tablet Take 0.5 mg by mouth 2 (two) times daily as needed.    . Multiple Vitamin (MULTIVITAMIN) capsule Take 1 capsule by mouth daily.    . nabumetone (RELAFEN) 750 MG tablet Take 750 mg by mouth 2 (two) times daily.     . raloxifene (EVISTA) 60 MG tablet Take 60 mg by mouth daily.    . traMADol (ULTRAM) 50 MG tablet Take 50 mg by mouth every 6 (six) hours as needed.    . vitamin B-12 (  CYANOCOBALAMIN) 1000 MCG tablet Take 1,000 mcg by mouth daily.    Marland Kitchen zolpidem (AMBIEN) 10 MG tablet Take 3 mg by mouth at bedtime as needed for sleep.    Marland Kitchen amitriptyline (ELAVIL) 25 MG tablet Take 1 tablet (25 mg total) by mouth at bedtime. 30 tablet 6   No facility-administered medications prior to visit.    PAST MEDICAL HISTORY: Past Medical History  Diagnosis Date  . Diverticulosis   . Allergic rhinitis   . Hyperlipidemia   . Headache(784.0)     sinus HAs    . Breast cancer     left breast cancer  . Arthritis     PAST SURGICAL HISTORY: Past Surgical History  Procedure Laterality Date  . Total knee arthroplasty      right  . Nasal sinus surgery      2004  . Replacement total knee      left  . Abdominal hysterectomy    . Mastectomy      left  . Shoulder surgery Bilateral     repairs  . Esophagogastroduodenoscopy N/A 08/15/2012    Procedure: ESOPHAGOGASTRODUODENOSCOPY (EGD);  Surgeon: Inda Castle, MD;  Location: [facility];  Service: Endoscopy;  Laterality: N/A;    FAMILY HISTORY: Family History  Problem Relation Age of Onset  . Colon cancer Father 4  . Breast cancer Mother   . Pancreatic cancer Sister   . Leukemia Son     SOCIAL HISTORY:  History   Social History  . Marital Status: Married    Spouse Name: Doren Custard  . Number of Children: 3  . Years of Education: MBA   Occupational History  . Retired    Social History Main Topics  . Smoking status: Former Smoker -- 0.50 packs/day for 6 years    Types: Cigarettes    Quit date: 01/15/1958  . Smokeless tobacco: Never Used  . Alcohol Use: 0.0 oz/week    0 Standard drinks or equivalent per week     Comment: glass of wine a night  . Drug Use: No  . Sexual Activity: Not on file   Other Topics Concern  . Not on file   Social History Narrative   Patient lives at home with spouse.   Caffeine use: none   3 children; 1 deceased     PHYSICAL EXAM  Filed Vitals:   04/28/14 0948  BP: 120/80  Pulse: 70  Height: 5\' 3"  (1.6 m)  Weight: 166 lb 12.8 oz (75.66 kg)    Body mass index is 29.55 kg/(m^2).  No exam data present  No flowsheet data found.  GENERAL EXAM: Patient is in no distress; well developed, nourished and groomed; neck is supple; MILD HIGH ARCH AND HAMMERTOES IN LEFT FOOT; SIG ECCHYMOSES IN RIGHT MAXILLARY AND RIGHT MANDIBULAR REGION  CARDIOVASCULAR: Regular rate and rhythm, no murmurs, no carotid bruits  NEUROLOGIC: MENTAL STATUS:  awake, alert, language fluent, comprehension intact, naming intact, fund of knowledge appropriate CRANIAL NERVE: no papilledema on fundoscopic exam, pupils equal and reactive to light, visual fields full to confrontation, extraocular muscles intact, no nystagmus, facial sensation and strength symmetric, hearing intact, palate elevates symmetrically, uvula midline, shoulder shrug symmetric, tongue midline. MOTOR: normal bulk and tone, full strength in the BUE, BLE; EXCEPT DF WEAKNESS (3) IN BILATERAL FEET SENSORY: DECR PP, LT, TEMP IN FEET; ABSENT VIB AT TOES AND ANKLES. DECR PROPRIO IN LEFT TOES COORDINATION: finger-nose-finger, fine finger movements normal REFLEXES: deep tendon reflexes present and symmetric GAIT/STATION: narrow based gait;  SCUFFS FEET. SLOW UNSTEADY TURNING. CANNOT TOE, HEEL OR TANDEM; UNSTEADY ON ROMBERG    DIAGNOSTIC DATA (LABS, IMAGING, TESTING) - I reviewed patient records, labs, notes, testing and imaging myself where available.  Lab Results  Component Value Date   WBC 9.1 11/19/2013   HGB 13.5 11/19/2013   HCT 40.2 11/19/2013   MCV 89.9 11/19/2013   PLT 267 11/19/2013      Component Value Date/Time   NA 139 11/19/2013 0531   K 6.2* 11/19/2013 0531   CL 102 11/19/2013 0531   CO2 25 11/19/2013 0531   GLUCOSE 110* 11/19/2013 0531   BUN 21 11/19/2013 0531   CREATININE 0.70 11/19/2013 0531   CALCIUM 9.3 11/19/2013 0531   PROT 6.6 01/20/2014 1237   PROT 6.5 11/19/2013 0531   ALBUMIN 3.4* 11/19/2013 0531   AST 48* 11/19/2013 0531   ALT 26 11/19/2013 0531   ALKPHOS 52 11/19/2013 0531   BILITOT 0.4 11/19/2013 0531   GFRNONAA 79* 11/19/2013 0531   GFRAA >90 11/19/2013 0531   No results found for: CHOL, HDL, LDLCALC, LDLDIRECT, TRIG, CHOLHDL Lab Results  Component Value Date   HGBA1C 5.7* 01/20/2014   Lab Results  Component Value Date   VITAMINB12 >1999* 01/20/2014   Lab Results  Component Value Date   TSH 1.200 01/20/2014     I reviewed images  myself and agree with interpretation. -VRP  01/02/14 CT head / maxillofacial 1. No acute intracranial pathology. 2. No acute osseous injury of the maxillofacial bones. 3. Mild generalized cerebral atrophy and chronic microvascular disease.  09/10/13 CTA head/neck 1. Negative for age CTA head and neck. 2. Stable CT appearance of the brain.  01/20/14 Neuropathy panel - negative  03/15/14 MRI lumbar spine demonstrating multilevel degenerative changes as detailed above. Specifically: 1. Moderate left foraminal narrowing at L1-L2 due to disc protrusion, endplate spurring and facet hypertrophy could lead to dynamic impingement of the left L1 nerve root  2. Moderately severe right lateral recess stenosis at L2-L3 due to disc protrusion, endplate spurring, mild facet hypertrophy leading to mild spinal stenosis. There could be right L3 nerve root compression at this level. 3. Moderately to severe fairly narrowed neural foramina and mild to moderate lateral recess stenosis at L3-L4 due to disc protrusion, endplate spurring and facet hypertrophy causing mild spinal stenosis. There could be impingement of either of the exiting L3 nerve root with less potential for dynamic impingement of the L4 nerve roots. 4. Moderate bilateral foraminal narrowing at L4-L5 due to disc bulge and facet hypertrophy that could lead to dynamic impingement of the exiting L4 nerve roots   5. Severe right lateral recess stenosis and bilateral moderate foraminal narrowing at L5-S1 due to anterolisthesis, severe facet hypertrophy and right paramedian disc herniation. There appears to be compression of the right S1 nerve root and there could also be dynamic impingement of the exiting L5 nerve roots.  03/19/14 EMG/NCS  1. Severe length dependent axonal sensorimotor polyneuropathy. 2. Bilateral lower lumbar radiculopathies.    ASSESSMENT AND PLAN  81 y.o. year old female with postconcussion syndrome (fall on 11/19/13) with  resultant scalp numbness/sensitivity, facial pain. Also with suspected underlying peripheral neuropathy for past 5-6 years, without clear etiology (possible hereditary neuropathy). Then second fall on Feb 19, 2014, with right facial trauma (no fractures) and right forearm fracture.   EMG and MRI lumbar spine confirm combination of severe axonal neuropathy + lumbar polyradiculopathy. I do not think patient would be a good surgical candidate at this  time due to combination of neuropathy + lumbar spine disease, lack of back pain, advanced age, and relatively good improvement with conservative therapy. PT evaluation and exercises have significantly improved her balance and walking.   Dx: idiopathic hereditary neuropathy + lumbar polyradiculopathy  PLAN: - continue PT exercises - caution with gait and balance  Return if symptoms worsen or fail to improve, for return to PCP.  I spent 10 minutes of face to face time with patient. Greater than 50% of time was spent in counseling and coordination of care with patient.    Penni Bombard, MD 5/79/0383, 33:83 AM Certified in Neurology, Neurophysiology and Neuroimaging  [facility] [address], [place], [place] [address] [phone]

## 2014-04-29 DIAGNOSIS — R2681 Unsteadiness on feet: Secondary | ICD-10-CM | POA: Diagnosis not present

## 2014-05-03 DIAGNOSIS — R2681 Unsteadiness on feet: Secondary | ICD-10-CM | POA: Diagnosis not present

## 2014-05-05 DIAGNOSIS — S52551D Other extraarticular fracture of lower end of right radius, subsequent encounter for closed fracture with routine healing: Secondary | ICD-10-CM | POA: Diagnosis not present

## 2014-05-10 DIAGNOSIS — S099 Unspecified injury of face and head: Secondary | ICD-10-CM | POA: Diagnosis not present

## 2014-05-11 DIAGNOSIS — R2681 Unsteadiness on feet: Secondary | ICD-10-CM | POA: Diagnosis not present

## 2014-06-10 DIAGNOSIS — M75122 Complete rotator cuff tear or rupture of left shoulder, not specified as traumatic: Secondary | ICD-10-CM | POA: Diagnosis not present

## 2014-06-10 DIAGNOSIS — M19012 Primary osteoarthritis, left shoulder: Secondary | ICD-10-CM | POA: Diagnosis not present

## 2014-06-10 DIAGNOSIS — M7502 Adhesive capsulitis of left shoulder: Secondary | ICD-10-CM | POA: Diagnosis not present

## 2014-07-01 DIAGNOSIS — S91341A Puncture wound with foreign body, right foot, initial encounter: Secondary | ICD-10-CM | POA: Diagnosis not present

## 2014-07-01 DIAGNOSIS — W228XXA Striking against or struck by other objects, initial encounter: Secondary | ICD-10-CM | POA: Diagnosis not present

## 2014-07-27 DIAGNOSIS — Z6828 Body mass index (BMI) 28.0-28.9, adult: Secondary | ICD-10-CM | POA: Diagnosis not present

## 2014-07-27 DIAGNOSIS — M25519 Pain in unspecified shoulder: Secondary | ICD-10-CM | POA: Diagnosis not present

## 2014-07-27 DIAGNOSIS — M810 Age-related osteoporosis without current pathological fracture: Secondary | ICD-10-CM | POA: Diagnosis not present

## 2014-07-27 DIAGNOSIS — F419 Anxiety disorder, unspecified: Secondary | ICD-10-CM | POA: Diagnosis not present

## 2014-07-27 DIAGNOSIS — R2689 Other abnormalities of gait and mobility: Secondary | ICD-10-CM | POA: Diagnosis not present

## 2014-07-27 DIAGNOSIS — Z1389 Encounter for screening for other disorder: Secondary | ICD-10-CM | POA: Diagnosis not present

## 2014-07-27 DIAGNOSIS — D692 Other nonthrombocytopenic purpura: Secondary | ICD-10-CM | POA: Diagnosis not present

## 2014-08-09 ENCOUNTER — Other Ambulatory Visit: Payer: Self-pay

## 2014-08-09 MED ORDER — GABAPENTIN 300 MG PO CAPS: 300.0000 mg | ORAL_CAPSULE | Freq: Three times a day (TID) | ORAL | Status: DC

## 2014-08-19 DIAGNOSIS — M19012 Primary osteoarthritis, left shoulder: Secondary | ICD-10-CM | POA: Diagnosis not present

## 2014-08-19 DIAGNOSIS — M7502 Adhesive capsulitis of left shoulder: Secondary | ICD-10-CM | POA: Diagnosis not present

## 2014-08-19 DIAGNOSIS — M75122 Complete rotator cuff tear or rupture of left shoulder, not specified as traumatic: Secondary | ICD-10-CM | POA: Diagnosis not present

## 2014-08-23 DIAGNOSIS — R2689 Other abnormalities of gait and mobility: Secondary | ICD-10-CM | POA: Diagnosis not present

## 2014-08-23 DIAGNOSIS — M25552 Pain in left hip: Secondary | ICD-10-CM | POA: Diagnosis not present

## 2014-08-23 DIAGNOSIS — S79912A Unspecified injury of left hip, initial encounter: Secondary | ICD-10-CM | POA: Diagnosis not present

## 2014-08-23 DIAGNOSIS — M1990 Unspecified osteoarthritis, unspecified site: Secondary | ICD-10-CM | POA: Diagnosis not present

## 2014-08-23 DIAGNOSIS — S299XXA Unspecified injury of thorax, initial encounter: Secondary | ICD-10-CM | POA: Diagnosis not present

## 2014-08-23 DIAGNOSIS — Z6821 Body mass index (BMI) 21.0-21.9, adult: Secondary | ICD-10-CM | POA: Diagnosis not present

## 2014-08-23 DIAGNOSIS — R0781 Pleurodynia: Secondary | ICD-10-CM | POA: Diagnosis not present

## 2014-09-06 DIAGNOSIS — M12812 Other specific arthropathies, not elsewhere classified, left shoulder: Secondary | ICD-10-CM | POA: Diagnosis not present

## 2014-09-16 DIAGNOSIS — Z23 Encounter for immunization: Secondary | ICD-10-CM | POA: Diagnosis not present

## 2014-09-16 DIAGNOSIS — K5900 Constipation, unspecified: Secondary | ICD-10-CM | POA: Diagnosis not present

## 2014-09-16 DIAGNOSIS — Z96652 Presence of left artificial knee joint: Secondary | ICD-10-CM | POA: Diagnosis not present

## 2014-09-16 DIAGNOSIS — Z471 Aftercare following joint replacement surgery: Secondary | ICD-10-CM | POA: Diagnosis not present

## 2014-09-16 DIAGNOSIS — F419 Anxiety disorder, unspecified: Secondary | ICD-10-CM | POA: Diagnosis not present

## 2014-09-16 DIAGNOSIS — R32 Unspecified urinary incontinence: Secondary | ICD-10-CM | POA: Diagnosis not present

## 2014-09-16 DIAGNOSIS — Z6828 Body mass index (BMI) 28.0-28.9, adult: Secondary | ICD-10-CM | POA: Diagnosis not present

## 2014-09-16 DIAGNOSIS — M1711 Unilateral primary osteoarthritis, right knee: Secondary | ICD-10-CM | POA: Diagnosis not present

## 2014-09-24 DIAGNOSIS — R2689 Other abnormalities of gait and mobility: Secondary | ICD-10-CM | POA: Diagnosis not present

## 2014-09-24 DIAGNOSIS — M6281 Muscle weakness (generalized): Secondary | ICD-10-CM | POA: Diagnosis not present

## 2014-10-01 DIAGNOSIS — M6281 Muscle weakness (generalized): Secondary | ICD-10-CM | POA: Diagnosis not present

## 2014-10-01 DIAGNOSIS — R2689 Other abnormalities of gait and mobility: Secondary | ICD-10-CM | POA: Diagnosis not present

## 2014-10-04 DIAGNOSIS — M6281 Muscle weakness (generalized): Secondary | ICD-10-CM | POA: Diagnosis not present

## 2014-10-04 DIAGNOSIS — R2689 Other abnormalities of gait and mobility: Secondary | ICD-10-CM | POA: Diagnosis not present

## 2014-10-07 DIAGNOSIS — R2689 Other abnormalities of gait and mobility: Secondary | ICD-10-CM | POA: Diagnosis not present

## 2014-10-07 DIAGNOSIS — M6281 Muscle weakness (generalized): Secondary | ICD-10-CM | POA: Diagnosis not present

## 2014-10-12 DIAGNOSIS — Z6827 Body mass index (BMI) 27.0-27.9, adult: Secondary | ICD-10-CM | POA: Diagnosis not present

## 2014-10-12 DIAGNOSIS — M25552 Pain in left hip: Secondary | ICD-10-CM | POA: Diagnosis not present

## 2014-10-12 DIAGNOSIS — M1990 Unspecified osteoarthritis, unspecified site: Secondary | ICD-10-CM | POA: Diagnosis not present

## 2014-10-14 DIAGNOSIS — R2689 Other abnormalities of gait and mobility: Secondary | ICD-10-CM | POA: Diagnosis not present

## 2014-10-14 DIAGNOSIS — M6281 Muscle weakness (generalized): Secondary | ICD-10-CM | POA: Diagnosis not present

## 2014-10-18 DIAGNOSIS — M6281 Muscle weakness (generalized): Secondary | ICD-10-CM | POA: Diagnosis not present

## 2014-10-18 DIAGNOSIS — R2689 Other abnormalities of gait and mobility: Secondary | ICD-10-CM | POA: Diagnosis not present

## 2014-10-22 DIAGNOSIS — R2689 Other abnormalities of gait and mobility: Secondary | ICD-10-CM | POA: Diagnosis not present

## 2014-10-22 DIAGNOSIS — M6281 Muscle weakness (generalized): Secondary | ICD-10-CM | POA: Diagnosis not present

## 2014-10-25 DIAGNOSIS — M25512 Pain in left shoulder: Secondary | ICD-10-CM | POA: Diagnosis not present

## 2014-10-25 DIAGNOSIS — M12812 Other specific arthropathies, not elsewhere classified, left shoulder: Secondary | ICD-10-CM | POA: Diagnosis not present

## 2014-10-27 DIAGNOSIS — F419 Anxiety disorder, unspecified: Secondary | ICD-10-CM | POA: Diagnosis not present

## 2014-10-27 DIAGNOSIS — K602 Anal fissure, unspecified: Secondary | ICD-10-CM | POA: Diagnosis not present

## 2014-10-27 DIAGNOSIS — Z6827 Body mass index (BMI) 27.0-27.9, adult: Secondary | ICD-10-CM | POA: Diagnosis not present

## 2014-10-27 DIAGNOSIS — R110 Nausea: Secondary | ICD-10-CM | POA: Diagnosis not present

## 2014-10-27 DIAGNOSIS — K921 Melena: Secondary | ICD-10-CM | POA: Diagnosis not present

## 2014-10-29 DIAGNOSIS — R2689 Other abnormalities of gait and mobility: Secondary | ICD-10-CM | POA: Diagnosis not present

## 2014-10-29 DIAGNOSIS — M6281 Muscle weakness (generalized): Secondary | ICD-10-CM | POA: Diagnosis not present

## 2014-11-02 DIAGNOSIS — R2689 Other abnormalities of gait and mobility: Secondary | ICD-10-CM | POA: Diagnosis not present

## 2014-11-02 DIAGNOSIS — M6281 Muscle weakness (generalized): Secondary | ICD-10-CM | POA: Diagnosis not present

## 2014-11-03 DIAGNOSIS — M1711 Unilateral primary osteoarthritis, right knee: Secondary | ICD-10-CM | POA: Diagnosis not present

## 2014-11-08 ENCOUNTER — Other Ambulatory Visit: Payer: Self-pay

## 2014-11-08 ENCOUNTER — Encounter (HOSPITAL_COMMUNITY): Payer: Self-pay

## 2014-11-08 ENCOUNTER — Encounter (HOSPITAL_COMMUNITY)
Admission: RE | Admit: 2014-11-08 | Discharge: 2014-11-08 | Disposition: A | Discharge status: Home / Self Care | Payer: Commercial Managed Care - HMO | Source: Ambulatory Visit | Attending: Orthopedic Surgery | Admitting: Orthopedic Surgery

## 2014-11-08 DIAGNOSIS — M25712 Osteophyte, left shoulder: Secondary | ICD-10-CM | POA: Diagnosis present

## 2014-11-08 DIAGNOSIS — M1990 Unspecified osteoarthritis, unspecified site: Secondary | ICD-10-CM | POA: Diagnosis present

## 2014-11-08 DIAGNOSIS — Z7982 Long term (current) use of aspirin: Secondary | ICD-10-CM | POA: Diagnosis not present

## 2014-11-08 DIAGNOSIS — F329 Major depressive disorder, single episode, unspecified: Secondary | ICD-10-CM | POA: Diagnosis present

## 2014-11-08 DIAGNOSIS — K219 Gastro-esophageal reflux disease without esophagitis: Secondary | ICD-10-CM | POA: Diagnosis present

## 2014-11-08 DIAGNOSIS — G8918 Other acute postprocedural pain: Secondary | ICD-10-CM | POA: Diagnosis not present

## 2014-11-08 DIAGNOSIS — M19012 Primary osteoarthritis, left shoulder: Secondary | ICD-10-CM | POA: Diagnosis not present

## 2014-11-08 DIAGNOSIS — Z853 Personal history of malignant neoplasm of breast: Secondary | ICD-10-CM | POA: Diagnosis not present

## 2014-11-08 DIAGNOSIS — Z87891 Personal history of nicotine dependence: Secondary | ICD-10-CM | POA: Diagnosis not present

## 2014-11-08 DIAGNOSIS — M12812 Other specific arthropathies, not elsewhere classified, left shoulder: Secondary | ICD-10-CM | POA: Diagnosis not present

## 2014-11-08 DIAGNOSIS — M75102 Unspecified rotator cuff tear or rupture of left shoulder, not specified as traumatic: Secondary | ICD-10-CM | POA: Diagnosis present

## 2014-11-08 HISTORY — DX: Depression, unspecified: F32.A

## 2014-11-08 HISTORY — DX: Concussion with loss of consciousness of unspecified duration, initial encounter: S06.0X9A

## 2014-11-08 HISTORY — DX: Gastro-esophageal reflux disease without esophagitis: K21.9

## 2014-11-08 HISTORY — DX: Major depressive disorder, single episode, unspecified: F32.9

## 2014-11-08 HISTORY — DX: Anxiety disorder, unspecified: F41.9

## 2014-11-08 LAB — CBC WITH DIFFERENTIAL/PLATELET
BASOS ABS: 0.1 10*3/uL (ref 0.0–0.1)
BASOS PCT: 1 %
EOS PCT: 3 %
Eosinophils Absolute: 0.2 10*3/uL (ref 0.0–0.7)
HCT: 44.4 % (ref 36.0–46.0)
Hemoglobin: 14.4 g/dL (ref 12.0–15.0)
LYMPHS PCT: 24 %
Lymphs Abs: 1.8 10*3/uL (ref 0.7–4.0)
MCH: 29.6 pg (ref 26.0–34.0)
MCHC: 32.4 g/dL (ref 30.0–36.0)
MCV: 91.2 fL (ref 78.0–100.0)
Monocytes Absolute: 0.7 10*3/uL (ref 0.1–1.0)
Monocytes Relative: 9 %
NEUTROS ABS: 4.9 10*3/uL (ref 1.7–7.7)
Neutrophils Relative %: 63 %
PLATELETS: 284 10*3/uL (ref 150–400)
RBC: 4.87 MIL/uL (ref 3.87–5.11)
RDW: 14.1 % (ref 11.5–15.5)
WBC: 7.6 10*3/uL (ref 4.0–10.5)

## 2014-11-08 LAB — PROTIME-INR
INR: 1.04 (ref 0.00–1.49)
Prothrombin Time: 13.8 seconds (ref 11.6–15.2)

## 2014-11-08 LAB — COMPREHENSIVE METABOLIC PANEL
ALBUMIN: 3.6 g/dL (ref 3.5–5.0)
ALT: 18 U/L (ref 14–54)
AST: 25 U/L (ref 15–41)
Alkaline Phosphatase: 57 U/L (ref 38–126)
Anion gap: 6 (ref 5–15)
BUN: 11 mg/dL (ref 6–20)
CHLORIDE: 107 mmol/L (ref 101–111)
CO2: 28 mmol/L (ref 22–32)
CREATININE: 0.77 mg/dL (ref 0.44–1.00)
Calcium: 9.2 mg/dL (ref 8.9–10.3)
GFR calc Af Amer: >60 mL/min (ref >60)
GLUCOSE: 103 mg/dL — AB (ref 65–99)
Potassium: 3.9 mmol/L (ref 3.5–5.1)
Sodium: 141 mmol/L (ref 135–145)
Total Bilirubin: 0.6 mg/dL (ref 0.3–1.2)
Total Protein: 6.2 g/dL — ABNORMAL LOW (ref 6.5–8.1)

## 2014-11-08 LAB — SURGICAL PCR SCREEN
MRSA, PCR: NEGATIVE
Staphylococcus aureus: NEGATIVE

## 2014-11-08 LAB — APTT: APTT: 27 s (ref 24–37)

## 2014-11-08 NOTE — Pre-Procedure Instructions (Signed)
    [name]  11/08/2014      [facility] [place] [address] [place] [address] Phone: [phone] Fax: [phone]  [facility] [place], [place] [address] Phone: [phone] Fax: [phone]    Your procedure is scheduled on 11-11-2014  Thursday   Report to [facility] Admitting at 8:00  A.M.   Call this number if you have problems the morning of surgery:  [phone]   Remember:  Do not eat food or drink liquids after midnight.   Take these medicines the morning of surgery with A SIP OF WATER Tylenol if needed,nexium,allegra,flonase nasal spray,pain medication if needed,lorazepam(ativan),Evista              Stop Aspirin and fish oil today   Do not wear jewelry, make-up or nail polish.  Do not wear lotions, powders, or perfumes.  You may not wear deodorant.  Do not shave 48 hours prior to surgery.     Do not bring valuables to the hospital.  [facility] is not responsible for any belongings or valuables.  Contacts, dentures or bridgework may not be worn into surgery.  Leave your suitcase in the car.  After surgery it may be brought to your room.  For patients admitted to the hospital, discharge time will be determined by your treatment team.      Special instructions:  See attached Sheet  "Preparing for Surgery" for instructions on CHG shower    Please read over the following fact sheets that you were given. Pain Booklet, Coughing and Deep Breathing and Surgical Site Infection Prevention

## 2014-11-09 NOTE — Progress Notes (Signed)
Anesthesia Chart Review:  Pt is 81 year old female scheduled for L reverse shoulder arthroplasty on 11/11/2014 with Dr. Onnie Graham.   PCP is Dr. Prince Solian.   PMH includes: hyperlipidemia, breast cancer, GERD. Former smoker. BMI 28.   Preoperative labs reviewed.    EKG 11/08/2014: NSR. Septal infarct, age undetermined. No significant change since 11/19/13 per Dr. Evette Georges interpretation.   Echo 01/22/14: - Left ventricle: The cavity size was normal. Wall thickness was normal. Systolic function was normal. The estimated ejection fraction was in the range of 55% to 60%. - Aortic valve: There was mild stenosis. There was trivial regurgitation. - Mitral valve: Calcified annulus. Mildly thickened leaflets. - Atrial septum: No defect or patent foramen ovale was identified.  If no changes, I anticipate pt can proceed with surgery as scheduled.   [name], FNP-BC [facility]/Anesthesiology Phone: [phone] 11/09/2014 3:41 PM

## 2014-11-10 MED ORDER — LACTATED RINGERS IV SOLN
INTRAVENOUS | Status: DC
  Administered 2014-11-11 (×2): via INTRAVENOUS

## 2014-11-10 MED ORDER — CEFAZOLIN SODIUM-DEXTROSE 2-3 GM-% IV SOLR
2.0000 g | INTRAVENOUS | Status: AC
  Administered 2014-11-11: 2 g via INTRAVENOUS
  Filled 2014-11-10: qty 50

## 2014-11-10 MED ORDER — CHLORHEXIDINE GLUCONATE 4 % EX LIQD: 60.0000 mL | Freq: Once | CUTANEOUS | Status: DC

## 2014-11-11 ENCOUNTER — Inpatient Hospital Stay (HOSPITAL_COMMUNITY): Payer: Commercial Managed Care - HMO | Admitting: Emergency Medicine

## 2014-11-11 ENCOUNTER — Encounter (HOSPITAL_COMMUNITY)
Admission: RE | Disposition: A | Discharge status: Home Health | Payer: Self-pay | Source: Ambulatory Visit | Attending: Orthopedic Surgery

## 2014-11-11 ENCOUNTER — Encounter (HOSPITAL_COMMUNITY): Payer: Self-pay | Admitting: Certified Registered Nurse Anesthetist

## 2014-11-11 ENCOUNTER — Inpatient Hospital Stay (HOSPITAL_COMMUNITY)
Admission: RE | Admit: 2014-11-11 | Discharge: 2014-11-12 | DRG: 483 | Disposition: A | Discharge status: Home Health | Payer: Commercial Managed Care - HMO | Source: Ambulatory Visit | Attending: Orthopedic Surgery | Admitting: Orthopedic Surgery

## 2014-11-11 ENCOUNTER — Inpatient Hospital Stay (HOSPITAL_COMMUNITY): Payer: Commercial Managed Care - HMO | Admitting: Anesthesiology

## 2014-11-11 DIAGNOSIS — Z87891 Personal history of nicotine dependence: Secondary | ICD-10-CM | POA: Diagnosis not present

## 2014-11-11 DIAGNOSIS — Z7982 Long term (current) use of aspirin: Secondary | ICD-10-CM | POA: Diagnosis not present

## 2014-11-11 DIAGNOSIS — Z96619 Presence of unspecified artificial shoulder joint: Secondary | ICD-10-CM

## 2014-11-11 DIAGNOSIS — Z96612 Presence of left artificial shoulder joint: Secondary | ICD-10-CM

## 2014-11-11 DIAGNOSIS — K219 Gastro-esophageal reflux disease without esophagitis: Secondary | ICD-10-CM | POA: Diagnosis present

## 2014-11-11 DIAGNOSIS — M1990 Unspecified osteoarthritis, unspecified site: Secondary | ICD-10-CM | POA: Diagnosis present

## 2014-11-11 DIAGNOSIS — M75102 Unspecified rotator cuff tear or rupture of left shoulder, not specified as traumatic: Secondary | ICD-10-CM | POA: Diagnosis present

## 2014-11-11 DIAGNOSIS — F329 Major depressive disorder, single episode, unspecified: Secondary | ICD-10-CM | POA: Diagnosis present

## 2014-11-11 DIAGNOSIS — Z853 Personal history of malignant neoplasm of breast: Secondary | ICD-10-CM | POA: Diagnosis not present

## 2014-11-11 DIAGNOSIS — M25712 Osteophyte, left shoulder: Secondary | ICD-10-CM | POA: Diagnosis present

## 2014-11-11 HISTORY — PX: REVERSE SHOULDER ARTHROPLASTY: SHX5054

## 2014-11-11 SURGERY — ARTHROPLASTY, SHOULDER, TOTAL, REVERSE
Anesthesia: General | Site: Shoulder | Laterality: Left

## 2014-11-11 MED ORDER — MIDAZOLAM HCL 2 MG/2ML IJ SOLN
0.5000 mg | Freq: Once | INTRAMUSCULAR | Status: AC | PRN
  Administered 2014-11-11: 0.5 mg via INTRAVENOUS

## 2014-11-11 MED ORDER — PROPOFOL 10 MG/ML IV BOLUS
INTRAVENOUS | Status: DC | PRN
  Administered 2014-11-11: 100 mg via INTRAVENOUS

## 2014-11-11 MED ORDER — POLYETHYLENE GLYCOL 3350 17 G PO PACK: 17.0000 g | PACK | Freq: Every day | ORAL | Status: DC | PRN

## 2014-11-11 MED ORDER — ACETAMINOPHEN 650 MG RE SUPP: 650.0000 mg | Freq: Four times a day (QID) | RECTAL | Status: DC | PRN

## 2014-11-11 MED ORDER — MENTHOL 3 MG MT LOZG: 1.0000 | LOZENGE | OROMUCOSAL | Status: DC | PRN

## 2014-11-11 MED ORDER — MEPERIDINE HCL 25 MG/ML IJ SOLN: 6.2500 mg | INTRAMUSCULAR | Status: DC | PRN

## 2014-11-11 MED ORDER — OXYCODONE HCL 5 MG PO TABS
ORAL_TABLET | ORAL | Status: AC
  Administered 2014-11-11: 5 mg
  Filled 2014-11-11: qty 1

## 2014-11-11 MED ORDER — MIDAZOLAM HCL 2 MG/2ML IJ SOLN
INTRAMUSCULAR | Status: AC
  Filled 2014-11-11: qty 2

## 2014-11-11 MED ORDER — ROCURONIUM BROMIDE 100 MG/10ML IV SOLN
INTRAVENOUS | Status: DC | PRN
  Administered 2014-11-11: 50 mg via INTRAVENOUS

## 2014-11-11 MED ORDER — ARTIFICIAL TEARS OP OINT
TOPICAL_OINTMENT | OPHTHALMIC | Status: DC | PRN
  Administered 2014-11-11: 1 via OPHTHALMIC

## 2014-11-11 MED ORDER — LORATADINE 10 MG PO TABS
10.0000 mg | ORAL_TABLET | Freq: Every day | ORAL | Status: DC
  Administered 2014-11-12: 10 mg via ORAL
  Filled 2014-11-11: qty 1

## 2014-11-11 MED ORDER — ZOLPIDEM TARTRATE 5 MG PO TABS
5.0000 mg | ORAL_TABLET | Freq: Every evening | ORAL | Status: DC | PRN
  Administered 2014-11-11: 5 mg via ORAL
  Filled 2014-11-11: qty 1

## 2014-11-11 MED ORDER — BISACODYL 5 MG PO TBEC: 5.0000 mg | DELAYED_RELEASE_TABLET | Freq: Every day | ORAL | Status: DC | PRN

## 2014-11-11 MED ORDER — PHENOL 1.4 % MT LIQD: 1.0000 | OROMUCOSAL | Status: DC | PRN

## 2014-11-11 MED ORDER — ACETAMINOPHEN 325 MG PO TABS
650.0000 mg | ORAL_TABLET | Freq: Four times a day (QID) | ORAL | Status: DC | PRN
  Administered 2014-11-12: 650 mg via ORAL
  Filled 2014-11-11: qty 2

## 2014-11-11 MED ORDER — ONDANSETRON HCL 4 MG/2ML IJ SOLN
4.0000 mg | Freq: Four times a day (QID) | INTRAMUSCULAR | Status: DC | PRN
  Administered 2014-11-12: 4 mg via INTRAVENOUS
  Filled 2014-11-11: qty 2

## 2014-11-11 MED ORDER — HYDROMORPHONE HCL 1 MG/ML IJ SOLN
1.0000 mg | INTRAMUSCULAR | Status: DC | PRN
  Filled 2014-11-11 (×2): qty 1

## 2014-11-11 MED ORDER — ONDANSETRON HCL 4 MG/2ML IJ SOLN
INTRAMUSCULAR | Status: DC | PRN
  Administered 2014-11-11: 4 mg via INTRAVENOUS

## 2014-11-11 MED ORDER — ALUM & MAG HYDROXIDE-SIMETH 200-200-20 MG/5ML PO SUSP
30.0000 mL | ORAL | Status: DC | PRN
Start: 2014-11-11 — End: 2014-11-12

## 2014-11-11 MED ORDER — METOCLOPRAMIDE HCL 5 MG/ML IJ SOLN: 5.0000 mg | Freq: Three times a day (TID) | INTRAMUSCULAR | Status: DC | PRN

## 2014-11-11 MED ORDER — DIAZEPAM 5 MG PO TABS
5.0000 mg | ORAL_TABLET | Freq: Four times a day (QID) | ORAL | Status: DC | PRN
  Administered 2014-11-11 – 2014-11-12 (×3): 5 mg via ORAL
  Filled 2014-11-11 (×3): qty 1

## 2014-11-11 MED ORDER — NEOSTIGMINE METHYLSULFATE 10 MG/10ML IV SOLN
INTRAVENOUS | Status: DC | PRN
  Administered 2014-11-11: 3 mg via INTRAVENOUS

## 2014-11-11 MED ORDER — SODIUM CHLORIDE 0.9 % IV SOLN
10000.0000 ug | INTRAVENOUS | Status: DC | PRN
  Administered 2014-11-11: 40 ug/min via INTRAVENOUS

## 2014-11-11 MED ORDER — GABAPENTIN 300 MG PO CAPS
300.0000 mg | ORAL_CAPSULE | Freq: Every day | ORAL | Status: DC
  Administered 2014-11-11: 300 mg via ORAL
  Filled 2014-11-11: qty 1

## 2014-11-11 MED ORDER — SODIUM CHLORIDE 0.9 % IR SOLN
Status: DC | PRN
  Administered 2014-11-11: 1000 mL

## 2014-11-11 MED ORDER — PROPOFOL 10 MG/ML IV BOLUS
INTRAVENOUS | Status: AC
  Filled 2014-11-11: qty 20

## 2014-11-11 MED ORDER — BUPIVACAINE-EPINEPHRINE (PF) 0.5% -1:200000 IJ SOLN
INTRAMUSCULAR | Status: DC | PRN
  Administered 2014-11-11: 30 mL via PERINEURAL

## 2014-11-11 MED ORDER — NEOSTIGMINE METHYLSULFATE 10 MG/10ML IV SOLN
INTRAVENOUS | Status: AC
  Filled 2014-11-11: qty 1

## 2014-11-11 MED ORDER — FLUTICASONE PROPIONATE 50 MCG/ACT NA SUSP: 1.0000 | Freq: Every day | NASAL | Status: DC | PRN

## 2014-11-11 MED ORDER — PHENOL 1.4 % MT LIQD
1.0000 | OROMUCOSAL | Status: DC | PRN
  Filled 2014-11-11: qty 177

## 2014-11-11 MED ORDER — MAGNESIUM CITRATE PO SOLN: 1.0000 | Freq: Once | ORAL | Status: DC | PRN

## 2014-11-11 MED ORDER — MIDAZOLAM HCL 2 MG/2ML IJ SOLN
INTRAMUSCULAR | Status: AC
  Administered 2014-11-11: 0.5 mg
  Filled 2014-11-11: qty 2

## 2014-11-11 MED ORDER — MIDAZOLAM HCL 2 MG/2ML IJ SOLN
INTRAMUSCULAR | Status: AC
  Filled 2014-11-11: qty 4

## 2014-11-11 MED ORDER — PROMETHAZINE HCL 25 MG/ML IJ SOLN: 6.2500 mg | INTRAMUSCULAR | Status: DC | PRN

## 2014-11-11 MED ORDER — METOCLOPRAMIDE HCL 5 MG PO TABS
5.0000 mg | ORAL_TABLET | Freq: Three times a day (TID) | ORAL | Status: DC | PRN
Start: 2014-11-11 — End: 2014-11-12

## 2014-11-11 MED ORDER — SODIUM CHLORIDE 0.9 % IV SOLN: 10000.0000 ug | INTRAVENOUS | Status: DC | PRN

## 2014-11-11 MED ORDER — OXYCODONE HCL 5 MG PO TABS
5.0000 mg | ORAL_TABLET | ORAL | Status: DC | PRN
  Administered 2014-11-11 – 2014-11-12 (×5): 5 mg via ORAL
  Filled 2014-11-11 (×2): qty 1
  Filled 2014-11-11: qty 2
  Filled 2014-11-11 (×2): qty 1

## 2014-11-11 MED ORDER — DOCUSATE SODIUM 100 MG PO CAPS
100.0000 mg | ORAL_CAPSULE | Freq: Two times a day (BID) | ORAL | Status: DC
  Administered 2014-11-11 – 2014-11-12 (×2): 100 mg via ORAL
  Filled 2014-11-11 (×2): qty 1

## 2014-11-11 MED ORDER — GLYCOPYRROLATE 0.2 MG/ML IJ SOLN
INTRAMUSCULAR | Status: DC | PRN
  Administered 2014-11-11: 0.4 mg via INTRAVENOUS

## 2014-11-11 MED ORDER — NORTRIPTYLINE HCL 25 MG PO CAPS
25.0000 mg | ORAL_CAPSULE | Freq: Every day | ORAL | Status: DC
  Administered 2014-11-11: 25 mg via ORAL
  Filled 2014-11-11 (×3): qty 1

## 2014-11-11 MED ORDER — FENTANYL CITRATE (PF) 250 MCG/5ML IJ SOLN
INTRAMUSCULAR | Status: AC
Start: 2014-11-11 — End: 2014-11-11
  Filled 2014-11-11: qty 5

## 2014-11-11 MED ORDER — FENTANYL CITRATE (PF) 100 MCG/2ML IJ SOLN
INTRAMUSCULAR | Status: AC
  Administered 2014-11-11: 100 ug
  Filled 2014-11-11: qty 2

## 2014-11-11 MED ORDER — PANTOPRAZOLE SODIUM 40 MG PO TBEC
40.0000 mg | DELAYED_RELEASE_TABLET | Freq: Every day | ORAL | Status: DC
  Administered 2014-11-12: 40 mg via ORAL
  Filled 2014-11-11: qty 1

## 2014-11-11 MED ORDER — HYDROMORPHONE HCL 1 MG/ML IJ SOLN: 0.2500 mg | INTRAMUSCULAR | Status: DC | PRN

## 2014-11-11 MED ORDER — ONDANSETRON HCL 4 MG PO TABS: 4.0000 mg | ORAL_TABLET | Freq: Four times a day (QID) | ORAL | Status: DC | PRN

## 2014-11-11 MED ORDER — FENTANYL CITRATE (PF) 100 MCG/2ML IJ SOLN
INTRAMUSCULAR | Status: DC | PRN
  Administered 2014-11-11: 100 ug via INTRAVENOUS

## 2014-11-11 MED ORDER — DIAZEPAM 5 MG PO TABS
ORAL_TABLET | ORAL | Status: AC
  Filled 2014-11-11: qty 1

## 2014-11-11 MED ORDER — TRAMADOL HCL 50 MG PO TABS
50.0000 mg | ORAL_TABLET | Freq: Four times a day (QID) | ORAL | Status: DC | PRN
  Administered 2014-11-12: 50 mg via ORAL
  Filled 2014-11-11: qty 1

## 2014-11-11 MED ORDER — CEFAZOLIN SODIUM-DEXTROSE 2-3 GM-% IV SOLR
2.0000 g | Freq: Four times a day (QID) | INTRAVENOUS | Status: AC
  Administered 2014-11-12 (×2): 2 g via INTRAVENOUS
  Filled 2014-11-11 (×3): qty 50

## 2014-11-11 MED ORDER — ROCURONIUM BROMIDE 50 MG/5ML IV SOLN
INTRAVENOUS | Status: AC
  Filled 2014-11-11: qty 1

## 2014-11-11 MED ORDER — GLYCOPYRROLATE 0.2 MG/ML IJ SOLN
INTRAMUSCULAR | Status: AC
  Filled 2014-11-11: qty 3

## 2014-11-11 MED ORDER — LACTATED RINGERS IV SOLN
INTRAVENOUS | Status: DC
  Administered 2014-11-12: via INTRAVENOUS

## 2014-11-11 MED ORDER — ASPIRIN EC 81 MG PO TBEC
81.0000 mg | DELAYED_RELEASE_TABLET | Freq: Every day | ORAL | Status: DC
  Administered 2014-11-11: 81 mg via ORAL
  Filled 2014-11-11: qty 1

## 2014-11-11 SURGICAL SUPPLY — 74 items
BIT DRILL 170X2.5X (BIT) ×1 IMPLANT
BIT DRL 170X2.5X (BIT) ×1
BLADE SAW SGTL 83.5X18.5 (BLADE) ×3 IMPLANT
BRUSH FEMORAL CANAL (MISCELLANEOUS) IMPLANT
CAPT SHLDR REVTOTAL 1 ×3 IMPLANT
CEMENT BONE DEPUY (Cement) ×3 IMPLANT
COVER SURGICAL LIGHT HANDLE (MISCELLANEOUS) ×3 IMPLANT
DERMABOND ADHESIVE PROPEN (GAUZE/BANDAGES/DRESSINGS) ×2
DERMABOND ADVANCED (GAUZE/BANDAGES/DRESSINGS) ×2
DERMABOND ADVANCED .7 DNX12 (GAUZE/BANDAGES/DRESSINGS) ×1 IMPLANT
DERMABOND ADVANCED .7 DNX6 (GAUZE/BANDAGES/DRESSINGS) ×1 IMPLANT
DRAPE ORTHO SPLIT 77X108 STRL (DRAPES) ×4
DRAPE SURG 17X11 SM STRL (DRAPES) ×3 IMPLANT
DRAPE SURG ORHT 6 SPLT 77X108 (DRAPES) ×2 IMPLANT
DRAPE U-SHAPE 47X51 STRL (DRAPES) ×3 IMPLANT
DRILL 2.5 (BIT) ×2
DRILL BIT 7/64X5 (BIT) ×3 IMPLANT
DRSG AQUACEL AG ADV 3.5X10 (GAUZE/BANDAGES/DRESSINGS) ×3 IMPLANT
DRSG MEPILEX BORDER 4X8 (GAUZE/BANDAGES/DRESSINGS) IMPLANT
DURAPREP 26ML APPLICATOR (WOUND CARE) ×3 IMPLANT
ELECT BLADE 4.0 EZ CLEAN MEGAD (MISCELLANEOUS) ×3
ELECT CAUTERY BLADE 6.4 (BLADE) ×3 IMPLANT
ELECT REM PT RETURN 9FT ADLT (ELECTROSURGICAL) ×3
ELECTRODE BLDE 4.0 EZ CLN MEGD (MISCELLANEOUS) ×1 IMPLANT
ELECTRODE REM PT RTRN 9FT ADLT (ELECTROSURGICAL) ×1 IMPLANT
FACESHIELD WRAPAROUND (MASK) ×9 IMPLANT
GLOVE BIO SURGEON STRL SZ7.5 (GLOVE) ×3 IMPLANT
GLOVE BIO SURGEON STRL SZ8 (GLOVE) ×3 IMPLANT
GLOVE EUDERMIC 7 POWDERFREE (GLOVE) ×3 IMPLANT
GLOVE SS BIOGEL STRL SZ 7.5 (GLOVE) ×1 IMPLANT
GLOVE SUPERSENSE BIOGEL SZ 7.5 (GLOVE) ×2
GOWN STRL REUS W/ TWL LRG LVL3 (GOWN DISPOSABLE) IMPLANT
GOWN STRL REUS W/ TWL XL LVL3 (GOWN DISPOSABLE) ×2 IMPLANT
GOWN STRL REUS W/TWL LRG LVL3 (GOWN DISPOSABLE)
GOWN STRL REUS W/TWL XL LVL3 (GOWN DISPOSABLE) ×4
HANDPIECE INTERPULSE COAX TIP (DISPOSABLE)
KIT BASIN OR (CUSTOM PROCEDURE TRAY) ×3 IMPLANT
KIT ROOM TURNOVER OR (KITS) ×3 IMPLANT
MANIFOLD NEPTUNE II (INSTRUMENTS) ×3 IMPLANT
NDL SUT 6 .5 CRC .975X.05 MAYO (NEEDLE) IMPLANT
NEEDLE HYPO 25GX1X1/2 BEV (NEEDLE) IMPLANT
NEEDLE MAYO TAPER (NEEDLE)
NS IRRIG 1000ML POUR BTL (IV SOLUTION) ×3 IMPLANT
PACK SHOULDER (CUSTOM PROCEDURE TRAY) ×3 IMPLANT
PAD ARMBOARD 7.5X6 YLW CONV (MISCELLANEOUS) ×6 IMPLANT
PASSER SUT SWANSON 36MM LOOP (INSTRUMENTS) IMPLANT
PIN GUIDE 1.2 (PIN) ×3 IMPLANT
PIN GUIDE GLENOPHERE 1.5MX300M (PIN) ×3 IMPLANT
PIN METAGLENE 2.5 (PIN) ×3 IMPLANT
PRESSURIZER FEMORAL UNIV (MISCELLANEOUS) IMPLANT
RESTRAINT HEAD UNIVERSAL NS (MISCELLANEOUS) ×3 IMPLANT
SET HNDPC FAN SPRY TIP SCT (DISPOSABLE) IMPLANT
SLING ARM FOAM STRAP LRG (SOFTGOODS) IMPLANT
SLING ARM IMMOBILIZER LRG (SOFTGOODS) ×3 IMPLANT
SPONGE LAP 18X18 X RAY DECT (DISPOSABLE) ×6 IMPLANT
SPONGE LAP 4X18 X RAY DECT (DISPOSABLE) IMPLANT
SUCTION FRAZIER TIP 10 FR DISP (SUCTIONS) ×3 IMPLANT
SUT BONE WAX W31G (SUTURE) IMPLANT
SUT FIBERWIRE #2 38 T-5 BLUE (SUTURE) ×6
SUT MNCRL AB 3-0 PS2 18 (SUTURE) ×3 IMPLANT
SUT VIC AB 1 CT1 27 (SUTURE) ×2
SUT VIC AB 1 CT1 27XBRD ANBCTR (SUTURE) ×1 IMPLANT
SUT VIC AB 2-0 CT1 27 (SUTURE) ×2
SUT VIC AB 2-0 CT1 TAPERPNT 27 (SUTURE) ×1 IMPLANT
SUT VIC AB 2-0 SH 27 (SUTURE)
SUT VIC AB 2-0 SH 27X BRD (SUTURE) IMPLANT
SUTURE FIBERWR #2 38 T-5 BLUE (SUTURE) ×2 IMPLANT
SYR 30ML SLIP (SYRINGE) ×3 IMPLANT
SYR CONTROL 10ML LL (SYRINGE) IMPLANT
TOWEL OR 17X24 6PK STRL BLUE (TOWEL DISPOSABLE) ×3 IMPLANT
TOWEL OR 17X26 10 PK STRL BLUE (TOWEL DISPOSABLE) ×3 IMPLANT
TOWER CARTRIDGE SMART MIX (DISPOSABLE) IMPLANT
TRAY FOLEY CATH 16FRSI W/METER (SET/KITS/TRAYS/PACK) IMPLANT
WATER STERILE IRR 1000ML POUR (IV SOLUTION) ×3 IMPLANT

## 2014-11-11 NOTE — Progress Notes (Signed)
Pt called nurse to bedside asking why everyone in here that is coughing is not wearing a   Mask explained to pt that some patients when they are waking up are coughing from irritation from breathing tube and not from having aq cold or from being contagious pt asking for a mask for herself and becoming upset because other pts not  told to wear mask pt also upset because she has not been taken to her room explained she does not have room assignment at present talked to pt and told her we will move her to room here in pacu that is private and we can close doors and she will not be next to any patients she agreed to this and we moved pt to [place] at pts bedside pts husband called per pt request and pt talked to her husband pt remains upset crying and anxious treated with anxiety with iv meds by [employer]

## 2014-11-11 NOTE — H&P (Signed)
[name]    Chief Complaint: LEFT SHOULDER ROTATOR CUFF TEAR ARTHOPATHY HPI: The patient is a 81 y.o. female with end stage left shoulder rotator cuff tear arthropathy  Past Medical History  Diagnosis Date  . Diverticulosis   . Allergic rhinitis   . Hyperlipidemia   . Headache(784.0)     sinus HAs  . Breast cancer ([place])     left breast cancer  . Arthritis   . Concussion   . Anxiety   . Depression   . GERD (gastroesophageal reflux disease)     Past Surgical History  Procedure Laterality Date  . Total knee arthroplasty      right  . Nasal sinus surgery      2004  . Replacement total knee      left  . Abdominal hysterectomy    . Mastectomy      left  . Shoulder surgery Bilateral     repairs  . Esophagogastroduodenoscopy N/A 08/15/2012    Procedure: ESOPHAGOGASTRODUODENOSCOPY (EGD);  Surgeon: Inda Castle, MD;  Location: [facility];  Service: Endoscopy;  Laterality: N/A;    Family History  Problem Relation Age of Onset  . Colon cancer Father 28  . Breast cancer Mother   . Pancreatic cancer Sister   . Leukemia Son     Social History:  reports that she quit smoking about 56 years ago. Her smoking use included Cigarettes. She has a 3 pack-year smoking history. She has never used smokeless tobacco. She reports that she does not drink alcohol or use illicit drugs.  Allergies:  Allergies  Allergen Reactions  . Dust Mite Extract Other (See Comments)    Sinus drainage  . Other Other (See Comments)    Smoke, Cat Dander cause sinus drainage    Medications Prior to Admission  Medication Sig Dispense Refill  . acetaminophen (TYLENOL) 500 MG tablet Take 500 mg by mouth every 6 (six) hours as needed (pain).    Marland Kitchen aspirin EC 81 MG tablet Take 81 mg by mouth at bedtime.    . Calcium Carb-Cholecalciferol (CALCIUM 600 + D PO) Take 600 mg by mouth 2 (two) times daily.    . Esomeprazole Magnesium (NEXIUM PO) Take 22.3 mg by mouth daily.    . fexofenadine (ALLEGRA)  180 MG tablet Take 180 mg by mouth every other day.     . fluticasone (FLONASE) 50 MCG/ACT nasal spray Place 1 spray into both nostrils daily as needed for allergies or rhinitis.    Marland Kitchen gabapentin (NEURONTIN) 300 MG capsule Take 1 capsule (300 mg total) by mouth 3 (three) times daily. (Patient taking differently: Take 300 mg by mouth at bedtime. ) 270 capsule 1  . HYDROcodone-acetaminophen (NORCO/VICODIN) 5-325 MG tablet Take 1 tablet by mouth at bedtime as needed for moderate pain.    Marland Kitchen LORazepam (ATIVAN) 0.5 MG tablet Take 0.5 mg by mouth 2 (two) times daily as needed for anxiety.     . Multiple Vitamin (MULTIVITAMIN WITH MINERALS) TABS tablet Take 1 tablet by mouth daily.    . nortriptyline (PAMELOR) 25 MG capsule Take 25 mg by mouth at bedtime.    . Omega-3 Fatty Acids (FISH OIL) 1200 MG CAPS Take 1,200 mg by mouth 2 (two) times daily.    . raloxifene (EVISTA) 60 MG tablet Take 60 mg by mouth daily.    . traMADol (ULTRAM) 50 MG tablet Take 50 mg by mouth every 6 (six) hours as needed (pain).     . vitamin  B-12 (CYANOCOBALAMIN) 1000 MCG tablet Take 1,000 mcg by mouth daily.    Marland Kitchen zolpidem (AMBIEN) 10 MG tablet Take 3.33 mg by mouth at bedtime as needed for sleep. 1/3 tablet       Physical Exam: left shoulder with painful and restricted motion as noted at recent office visits  Vitals  Temp:  [97 F (36.1 C)] 97 F (36.1 C) (10/27 0827) Pulse Rate:  [72] 72 (10/27 0827) Resp:  [18] 18 (10/27 0827) BP: (154)/(93) 154/93 mmHg (10/27 0827) SpO2:  [96 %] 96 % (10/27 0827)  Assessment/Plan  Impression: LEFT SHOULDER ROTATOR CUFF TEAR ARTHOPATHY  Plan of Action: Procedure(s): LEFT REVERSE SHOULDER ARTHROPLASTY  Analyah Mcconnon M Jonnatan Hanners 11/11/2014, 9:25 AM Contact # [phone]

## 2014-11-11 NOTE — Anesthesia Procedure Notes (Addendum)
Anesthesia Regional Block:  Interscalene brachial plexus block  Pre-Anesthetic Checklist: ,, timeout performed, Correct Patient, Correct Site, Correct Laterality, Correct Procedure, Correct Position, site marked, Risks and benefits discussed,  Surgical consent,  Pre-op evaluation,  At surgeon's request and post-op pain management  Laterality: Left and Upper  Prep: chloraprep       Needles:  Injection technique: Single-shot  Needle Type: Echogenic Stimulator Needle     Needle Length: 4cm 4 cm Needle Gauge: 22 and 22 G    Additional Needles:  Procedures: nerve stimulator Interscalene brachial plexus block  Nerve Stimulator or Paresthesia:  Response: forearm twitch, 0.4 mA, 0.1 ms,   Additional Responses:   Narrative:  Start time: 11/11/2014 9:47 AM End time: 11/11/2014 9:52 AM Injection made incrementally with aspirations every 5 mL.  Performed by: Personally  Anesthesiologist: Glennon Mac, C[place]  Additional Notes: Pt identified in Holding room.  Monitors applied. Working IV access confirmed. Sterile prep L neck.  #22ga PNS to forearm twitch at 0.37mA threshold.  30cc 0.5% Bupivacaine with 1:200k epi injected incrementally after negative test dose.  Patient asymptomatic, VSS, no heme aspirated, tolerated well.  Jenita Seashore, MD   Procedure Name: Intubation Date/Time: 11/11/2014 10:23 AM Performed by: Michele Rockers Pre-anesthesia Checklist: Patient identified, Patient being monitored, Timeout performed, Emergency Drugs available and Suction available Patient Re-evaluated:Patient Re-evaluated prior to inductionOxygen Delivery Method: Circle System Utilized Preoxygenation: Pre-oxygenation with 100% oxygen Intubation Type: IV induction Ventilation: Mask ventilation without difficulty Laryngoscope Size: Miller and 2 Grade View: Grade I Tube type: Oral Tube size: 7.0 mm Number of attempts: 1 Airway Equipment and Method: Stylet Placement Confirmation: ETT inserted through  vocal cords under direct vision,  positive ETCO2 and breath sounds checked- equal and bilateral Secured at: 21 cm Tube secured with: Tape Dental Injury: Teeth and Oropharynx as per pre-operative assessment

## 2014-11-11 NOTE — Progress Notes (Signed)
Utilization review completed.  

## 2014-11-11 NOTE — Discharge Instructions (Signed)

## 2014-11-11 NOTE — Anesthesia Preprocedure Evaluation (Addendum)
Anesthesia Evaluation  Patient identified by MRN, date of birth, ID band Patient awake    Reviewed: Allergy & Precautions, NPO status , Patient's Chart, lab work & pertinent test results  History of Anesthesia Complications Negative for: history of anesthetic complications  Airway Mallampati: II  TM Distance: >3 FB Neck ROM: Full    Dental  (+) Dental Advisory Given, Caps   Pulmonary neg pulmonary ROS, former smoker (quit 1960),    breath sounds clear to auscultation       Cardiovascular (-) angina Rhythm:Regular Rate:Normal + Systolic murmurs 9/47 ECHO: EF 55-60%, mild AS   Neuro/Psych  Headaches, Anxiety Depression H/o vertigo    GI/Hepatic Neg liver ROS, GERD  Medicated and Controlled,  Endo/Other  negative endocrine ROS  Renal/GU negative Renal ROS     Musculoskeletal  (+) Arthritis , Osteoarthritis,    Abdominal   Peds  Hematology negative hematology ROS (+)   Anesthesia Other Findings Breast cancer  Reproductive/Obstetrics                           Anesthesia Physical Anesthesia Plan  ASA: III  Anesthesia Plan: General   Post-op Pain Management: GA combined w/ Regional for post-op pain   Induction: Intravenous  Airway Management Planned: Oral ETT  Additional Equipment:   Intra-op Plan:   Post-operative Plan: Extubation in OR  Informed Consent: I have reviewed the patients History and Physical, chart, labs and discussed the procedure including the risks, benefits and alternatives for the proposed anesthesia with the patient or authorized representative who has indicated his/her understanding and acceptance.   Dental advisory given  Plan Discussed with: CRNA and Surgeon  Anesthesia Plan Comments: (Plan routine monitors, GETA with interscalene block for post op analgesia)        Anesthesia Quick Evaluation

## 2014-11-11 NOTE — Anesthesia Postprocedure Evaluation (Signed)
  Anesthesia Post-op Note  Patient: [name]  Procedure(s) Performed: Procedure(s): LEFT REVERSE SHOULDER ARTHROPLASTY (Left)  Patient Location: PACU  Anesthesia Type:GA combined with regional for post-op pain  Level of Consciousness: awake, alert , oriented and patient cooperative  Airway and Oxygen Therapy: Patient Spontanous Breathing and Patient connected to nasal cannula oxygen  Post-op Pain: none  Post-op Assessment: Post-op Vital signs reviewed, Patient's Cardiovascular Status Stable, Respiratory Function Stable, Patent Airway, No signs of Nausea or vomiting and Pain level controlled              Post-op Vital Signs: Reviewed and stable  Last Vitals:  Filed Vitals:   11/11/14 1300  BP: 116/63  Pulse: 65  Temp:   Resp: 17    Complications: No apparent anesthesia complications

## 2014-11-11 NOTE — Progress Notes (Signed)
Pt c/o left thumb "feels hot". All left fingers are warm to touch w/good cap refill, 3+ palpable radial pulse good movement, pt denies numbness or tingling. Pt has had ISNB. Very frequent reassurance provided. Spouse fully updated by phone and he is aware of pt's anxiety. He will be over shortly.

## 2014-11-11 NOTE — Op Note (Signed)
11/11/2014  11:52 AM  PATIENT:   [name]  81 y.o. female  PRE-OPERATIVE DIAGNOSIS:  LEFT SHOULDER ROTATOR CUFF TEAR ARTHOPATHY  POST-OPERATIVE DIAGNOSIS:  same  PROCEDURE:  L RSA #8 stem, +3poly, 38 eccentric glenosphere  SURGEON:  Nixon Sparr, Metta Clines M.D.  ASSISTANTS: Shuford pac   ANESTHESIA:   GET + ISB  EBL: 250  SPECIMEN:  none  Drains: none   PATIENT DISPOSITION:  PACU - hemodynamically stable.    PLAN OF CARE: Admit for overnight observation  Dictation# [id number]   Contact # [phone]

## 2014-11-11 NOTE — Transfer of Care (Signed)
Immediate Anesthesia Transfer of Care Note  Patient: [name]  Procedure(s) Performed: Procedure(s): LEFT REVERSE SHOULDER ARTHROPLASTY (Left)  Patient Location: PACU  Anesthesia Type:General  Level of Consciousness: awake, alert , patient cooperative and responds to stimulation  Airway & Oxygen Therapy: Patient Spontanous Breathing and Patient connected to face mask oxygen  Post-op Assessment: Report given to RN, Post -op Vital signs reviewed and stable and Patient moving all extremities X 4  Post vital signs: Reviewed and stable  Last Vitals:  Filed Vitals:   11/11/14 1218  BP:   Pulse:   Temp: 36.3 C  Resp:     Complications: No apparent anesthesia complications

## 2014-11-11 NOTE — Progress Notes (Signed)
Lunch relief by S. Gregson RN 

## 2014-11-12 ENCOUNTER — Encounter (HOSPITAL_COMMUNITY): Payer: Self-pay | Admitting: Orthopedic Surgery

## 2014-11-12 MED ORDER — DIAZEPAM 5 MG PO TABS: 2.5000 mg | ORAL_TABLET | Freq: Four times a day (QID) | ORAL | Status: DC | PRN

## 2014-11-12 MED ORDER — ONDANSETRON HCL 4 MG PO TABS: 4.0000 mg | ORAL_TABLET | Freq: Three times a day (TID) | ORAL | Status: DC | PRN

## 2014-11-12 MED ORDER — OXYCODONE-ACETAMINOPHEN 5-325 MG PO TABS: 1.0000 | ORAL_TABLET | ORAL | Status: DC | PRN

## 2014-11-12 NOTE — Care Management Note (Signed)
Case Management Note  Patient Details  Name: [name] MRN: [id number] Date of Birth: Jun 06, 1929  Subjective/Objective:   80 yr old female s/p left reverse shoulder arthroplasty.                  Action/Plan:  Case manager spoke with patient's husband concerning need for home health . Choice was offered. Referral was called to [place]. [name] states they have several canes at home, so she will not need one.   Expected Discharge Date:    11/13/14              Expected Discharge Plan:   Home with Home Health  In-House Referral:  NA  Discharge planning Services  CM Consult  Post Acute Care Choice:  Home Health Choice offered to:  Spouse  DME Arranged:  N/A DME Agency:     HH Arranged:  NA, PT, OT HH Agency:  [facility]  Status of Service:  Completed, signed off  Medicare Important Message Given:    Date Medicare IM Given:    Medicare IM give by:    Date Additional Medicare IM Given:    Additional Medicare Important Message give by:     If discussed at [place] of Stay Meetings, dates discussed:    Additional Comments:  Ninfa Meeker, RN 11/12/2014, 2:24 PM

## 2014-11-12 NOTE — Progress Notes (Signed)
Husband expressed concern to this RN regarding how sleepy his wife was this AM including difficultly with participating with PT. Called [place] Shuf[place], PA-C and reviewed medications she had taken during the night. Recommended that they hold off on filling the Rx for Valium, only fill it later if she experiences muscle spasms - or - not to fill it at all if w/o muscle spasms. Updated and informed the patient's husband - and will note these instructions on the AVS which the patient and husband will take home. Husband stated he understood and agreed, appreciated the option if it should be needed.

## 2014-11-12 NOTE — Progress Notes (Signed)
AVS reviewed with all instructions including medications, prescriptions and care for dressing with husband. Nursing tech will take via w/c to [facility] tower exit to meet husband for drive home.

## 2014-11-12 NOTE — Evaluation (Signed)
Physical Therapy Evaluation Patient Details Name: ANETH SCHLAGEL MRN: [id number] DOB: [birth year] Today's Date: 11/12/2014   History of Present Illness  LEFT REVERSE SHOULDER ARTHROPLASTY on 11/11/2014, PMH: anxiety, depression, concussion.  Clinical Impression  Patient is s/p above surgery resulting in functional limitations due to the deficits listed below (see PT Problem List).  Patient will benefit from skilled PT to increase their independence and safety with mobility to allow discharge to the venue listed below.  Recommending second PT session due to patient's lethargy and instability with ambulation.      Follow Up Recommendations No PT follow up;Supervision/Assistance - 24 hour    Equipment Recommendations  None recommended by PT;Other (comment) (reports having cane at home. )    Recommendations for Other Services       Precautions / Restrictions  Precautions Precautions: Shoulder Type of Shoulder Precautions: Active protocol Shoulder Interventions: Shoulder sling/immobilizer;At all times;Off for dressing/bathing/exercises Required Braces or Orthoses: Sling Restrictions Weight Bearing Restrictions: Yes LUE Weight Bearing: Non weight bearing     Mobility  Bed Mobility    General bed mobility comments: up in chair upon arrival  Transfers Overall transfer level: Needs assistance Equipment used: None Transfers: Sit to/from Stand Sit to Stand: Min assist         General transfer comment: cues to use Rt UE to assist with stand.  Ambulation/Gait Ambulation/Gait assistance: Min assist Ambulation Distance (Feet): 125 Feet Assistive device: Straight cane Gait Pattern/deviations: Step-through pattern;Decreased step length - right;Decreased step length - left Gait velocity: decreased   General Gait Details: Intermittant instability during ambulation but no gross loss of balance. Husband observing mobility throughout session.   Stairs Stairs: Yes Stairs  assistance: Min assist Stair Management: No rails;Forwards Number of Stairs: 1 General stair comments: Assistance required to maintain balance during step.   Wheelchair Mobility    Modified Rankin (Stroke Patients Only)       Balance Overall balance assessment: Needs assistance Sitting-balance support: No upper extremity supported Sitting balance-Leahy Scale: Good     Standing balance support: Single extremity supported Standing balance-Leahy Scale: Poor Standing balance comment: using cane                              Pertinent Vitals/Pain Pain Assessment: Faces Faces Pain Scale: Hurts even more Pain Location: Lt shoulder Pain Descriptors / Indicators: Grimacing;Guarding Pain Intervention(s): Limited activity within patient's tolerance;Monitored during session    Home Living Family/patient expects to be discharged to:: Private residence Living Arrangements: Spouse/significant other Available Help at Discharge: Family;Available 24 hours/day Type of Home: House Home Access: Stairs to enter Entrance Stairs-Rails: None Entrance Stairs-Number of Steps: 1 Home Layout: One level Home Equipment: Cane - single point    Prior Function Level of Independence: Independent              Hand Dominance       Extremity/Trunk Assessment   Upper Extremity Assessment: Defer to OT evaluation        Lower Extremity Assessment: Generalized weakness         Communication   Communication: No difficulties  Cognition Arousal/Alertness: Lethargic;Suspect due to medications Behavior During Therapy: [facility] for tasks assessed/performed Overall Cognitive Status: History of cognitive impairments - at baseline (unable to recall why in hospital)                      General Comments     Exercises  Assessment/Plan    PT Assessment Patient needs continued PT services  PT Diagnosis Difficulty walking   PT Problem List Decreased  strength;Decreased range of motion;Decreased activity tolerance;Decreased balance;Decreased mobility;Decreased knowledge of use of DME  PT Treatment Interventions DME instruction;Gait training;Stair training;Functional mobility training;Therapeutic activities;Therapeutic exercise;Balance training;Patient/family education   PT Goals (Current goals can be found in the Care Plan section) Acute Rehab PT Goals Patient Stated Goal: go home today PT Goal Formulation: With patient Time For Goal Achievement: 11/26/14 Potential to Achieve Goals: Good    Frequency Min 3X/week   Barriers to discharge        Co-evaluation               End of Session Equipment Utilized During Treatment: Gait belt Activity Tolerance: Patient limited by lethargy Patient left: in chair;with call bell/phone within reach;with family/visitor present Nurse Communication: Mobility status         Time: 1031-1105 PT Time Calculation (min) (ACUTE ONLY): 34 min   Charges:   PT Evaluation $Initial PT Evaluation Tier I: 1 Procedure PT Treatments $Gait Training: 8-22 mins   PT G Codes:        Cassell Clement, PT, CSCS Pager [phone] Office [phone]  11/12/2014, 1:17 PM

## 2014-11-12 NOTE — Discharge Summary (Signed)
PATIENT ID:      [name]  MRN:     [id number] DOB/AGE:    1929/09/12 / 81 y.o.     DISCHARGE SUMMARY  ADMISSION DATE:    11/11/2014 DISCHARGE DATE:    ADMISSION DIAGNOSIS: LEFT SHOULDER ROTATOR CUFF TEAR ARTHOPATHY Past Medical History  Diagnosis Date  . Diverticulosis   . Allergic rhinitis   . Hyperlipidemia   . Headache(784.0)     sinus HAs  . Breast cancer ([place])     left breast cancer  . Arthritis   . Concussion   . Anxiety   . Depression   . GERD (gastroesophageal reflux disease)     DISCHARGE DIAGNOSIS:   Active Problems:   S/p reverse total shoulder arthroplasty   PROCEDURE: Procedure(s): LEFT REVERSE SHOULDER ARTHROPLASTY on 11/11/2014  CONSULTS:     HISTORY:  See H&P in chart.  HOSPITAL COURSE:  [name] is a 81 y.o. admitted on 11/11/2014 with a chief complaint of left shoulder pain and dysfunction, and found to have a diagnosis of [place].  They were brought to the operating room on 11/11/2014 and underwent Procedure(s): LEFT REVERSE SHOULDER ARTHROPLASTY.    They were given perioperative antibiotics: Anti-infectives    Start     Dose/Rate Route Frequency Ordered Stop   11/11/14 1700  ceFAZolin (ANCEF) IVPB 2 g/50 mL premix     2 g 100 mL/hr over 30 Minutes Intravenous Every 6 hours 11/11/14 1642 11/12/14 1059   11/11/14 0930  ceFAZolin (ANCEF) IVPB 2 g/50 mL premix     2 g 100 mL/hr over 30 Minutes Intravenous To [facility] 11/10/14 1209 11/11/14 1030    .  Patient underwent the above named procedure and tolerated it well. The following day they were hemodynamically stable and pain was controlled on oral analgesics. They were neurovascularly intact to the operative extremity. OT was ordered and worked with patient per protocol. They were medically and orthopaedically stable for discharge on day 1 . Home health services were arranged   DIAGNOSTIC STUDIES:  RECENT RADIOGRAPHIC STUDIES :   No results found.  RECENT VITAL SIGNS:  Patient Vitals for the past 24 hrs:  BP Temp Temp src Pulse Resp SpO2  11/12/14 0637 (!) 100/48 mmHg 98.6 F (37 C) Oral 62 16 93 %  11/12/14 0012 115/75 mmHg 98.9 F (37.2 C) Oral 81 16 97 %  11/11/14 2027 112/68 mmHg 97.5 F (36.4 C) Oral 73 18 96 %  11/11/14 1610 118/69 mmHg 97 F (36.1 C) - (!) 59 16 94 %  11/11/14 1530 120/66 mmHg 98 F (36.7 C) - - - -  11/11/14 1525 - - - 77 - 95 %  11/11/14 1524 - - - - 16 -  11/11/14 1515 - - - 87 17 98 %  11/11/14 1511 123/90 mmHg - - - - -  11/11/14 1456 110/62 mmHg - - - - -  11/11/14 1447 - - - 76 20 100 %  11/11/14 1400 - - - (!) 57 (!) 23 97 %  11/11/14 1348 (!) 117/58 mmHg - - - - -  11/11/14 1341 - - - (!) 58 20 100 %  11/11/14 1320 (!) 109/56 mmHg - - 70 18 100 %  11/11/14 1300 116/63 mmHg - - 65 17 100 %  11/11/14 1233 (!) 116/55 mmHg - - 63 17 97 %  11/11/14 1226 - - - 73 19 100 %  11/11/14 1218 125/71  mmHg 97.4 F (36.3 C) - - - -  .  RECENT EKG RESULTS:    Orders placed or performed in visit on 11/08/14  . EKG 12-Lead    DISCHARGE INSTRUCTIONS:  Discharge Instructions    Discontinue IV    Complete by:  As directed            DISCHARGE MEDICATIONS:     Medication List    STOP taking these medications        acetaminophen 500 MG tablet  Commonly known as:  TYLENOL     HYDROcodone-acetaminophen 5-325 MG tablet  Commonly known as:  NORCO/VICODIN     LORazepam 0.5 MG tablet  Commonly known as:  ATIVAN     traMADol 50 MG tablet  Commonly known as:  ULTRAM      TAKE these medications        aspirin EC 81 MG tablet  Take 81 mg by mouth at bedtime.     CALCIUM 600 + D PO  Take 600 mg by mouth 2 (two) times daily.     diazepam 5 MG tablet  Commonly known as:  VALIUM  Take 0.5-1 tablets (2.5-5 mg total) by mouth every 6 (six) hours as needed for muscle spasms or sedation.     fexofenadine 180 MG tablet  Commonly known as:  ALLEGRA  Take 180 mg by mouth  every other day.     Fish Oil 1200 MG Caps  Take 1,200 mg by mouth 2 (two) times daily.     fluticasone 50 MCG/ACT nasal spray  Commonly known as:  FLONASE  Place 1 spray into both nostrils daily as needed for allergies or rhinitis.     gabapentin 300 MG capsule  Commonly known as:  NEURONTIN  Take 1 capsule (300 mg total) by mouth 3 (three) times daily.     multivitamin with minerals Tabs tablet  Take 1 tablet by mouth daily.     NEXIUM PO  Take 22.3 mg by mouth daily.     nortriptyline 25 MG capsule  Commonly known as:  PAMELOR  Take 25 mg by mouth at bedtime.     ondansetron 4 MG tablet  Commonly known as:  ZOFRAN  Take 1 tablet (4 mg total) by mouth every 8 (eight) hours as needed for nausea or vomiting.     oxyCODONE-acetaminophen 5-325 MG tablet  Commonly known as:  PERCOCET  Take 1-2 tablets by mouth every 4 (four) hours as needed.     raloxifene 60 MG tablet  Commonly known as:  EVISTA  Take 60 mg by mouth daily.     vitamin B-12 1000 MCG tablet  Commonly known as:  CYANOCOBALAMIN  Take 1,000 mcg by mouth daily.     zolpidem 10 MG tablet  Commonly known as:  AMBIEN  Take 3.33 mg by mouth at bedtime as needed for sleep. 1/3 tablet        FOLLOW UP VISIT:       Follow-up Information    Follow up with Metta Clines SUPPLE, MD.   Specialty:  Orthopedic Surgery   Why:  call to be seen in 10-14 days   Contact information:   [address] [place] [address] [phone]       DISCHARGE TO: Home  DISPOSITION: Good  DISCHARGE CONDITION:  Festus Barren for Dr. Justice Britain 11/12/2014, 8:52 AM

## 2014-11-12 NOTE — Op Note (Signed)
[name], [name] NO.:  [id number]  MEDICAL RECORD NO.:  [id number]  LOCATION:  5N29C                        FACILITY:  [facility]  PHYSICIAN:  Metta Clines. Arul Farabee, M.D.  DATE OF BIRTH:  07-23-1929  DATE OF PROCEDURE:  11/11/2014 DATE OF DISCHARGE:                              OPERATIVE REPORT   PREOPERATIVE DIAGNOSIS:  End-stage left shoulder rotator cuff tear arthropathy.  POSTOPERATIVE DIAGNOSIS:  End-stage left shoulder rotator cuff tear arthropathy.  PROCEDURE:  Left reverse shoulder arthroplasty utilizing a press-fit size 8 DePuy stem with a +3 polyethylene insert and a 38 eccentric glenosphere.  SURGEON:  Metta Clines. Ulus Hazen, M.D.  Terrence DupontOlivia Mackie A. Shuford, PA-C.  ANESTHESIA:  General endotracheal as well as an interscalene block.  ESTIMATED BLOOD LOSS:  250 mL.  DRAINS:  None.  HISTORY:  [name] is an 81 year old female who has had chronic and progressively increasing left shoulder pain related to a rotator cuff tear arthropathy.  Her symptoms have progressively deteriorated to the point where she is having severe functional limitations.  She is brought to the operating room at this time for planned left reverse shoulder arthroplasty.  Preoperatively, I counseled [name] regarding treatment options, potential risks versus benefits thereof.  Possible surgical complications were all reviewed including bleeding, infection, neurovascular injury, persistent pain, loss of motion, anesthetic complications, failure of the implant, possible need for additional surgery.  She understands and accepts and agrees with the planned procedure.  DESCRIPTION OF PROCEDURE:  After undergoing routine preop evaluation, the patient received prophylactic antibiotics and an interscalene block was established in the holding room by Anesthesia Department.  She was placed supine on the operating table, underwent smooth induction of a general endotracheal  anesthesia.  Placed in beach-chair position and appropriately padded and protected.  Left shoulder girdle region was then sterilely prepped and draped in standard fashion.  Time-out was called.  An anterior deltopectoral incision was made approximately 10 cm on the left shoulder anteriorly.  Skin flaps were elevated and electrocautery was used for hemostasis.  Dissection carried deeply and the deltopectoral interval was then developed from proximal to distal and cephalic vein taken laterally.  The adhesions were divided beneath the deltoid and there was a previous mid deltoid splitting approach through which we had to divide the adhesions.  The upper cm of the pec major was tenotomized to enhance exposure.  The conjoined tendon was then mobilized and retracted medially.  At this point, the biceps tendon was then unroofed and tenotomized for later tenodesis.  The subscapularis was then divided away from the lesser tuberosity and the free margin was tagged with #2 FiberWires for later repair.  At this point, capsular tissues were then divided from the inferior aspect of the humeral head and the humeral head was then delivered through the wound.  Some small residual portions of the superior cuff was excised. We then gained access into the humeral medullary canal and reamed up to size 8 by hand reaming with excellent fit.  We used the intramedullary guide.  We then outlined our proposed humeral head resection, which we performed at 0-degrees of retroversion.  This cut was then performed at appropriate level using  an oscillating saw.  We then removed the osteophytes from the margin of the humeral head and neck region.  At this point, we then gained exposure of the glenoid placing a metal cap over the cut proximal humeral surface and then used a Fukuda, pitchfork, and snake tongue retractors to expose the glenoid.  We performed a circumferential labral resection.  Also, removed the proximal  stump of the biceps and gained circumferential visualization of the glenoid.  A guide pin was placed into the center of the glenoid and we reamed the glenoid to a stable subchondral bony bed.  Peripheral reamer was then used to remove the old residual soft tissue bone and debride the margins of the glenoid.  We then placed our central drill hole and cleaned the glenoid, and impacted the glenoid base plate.  We then inserted the base plate screws at the most inferior being locking, the balance of the screws were all nonlocking.  Excellent bony fixation was achieved.  We then tightened the locking screw inferiorly.  We then placed the 38 eccentric glenosphere over the base plate.  This was then sequentially tightened with excellent fixation.  We then returned our attention to the proximal humerus where we placed our reaming guide and reamed the metaphysis of the proximal humerus with the size 1 epi reamer.  Trial reduction was then performed with good soft tissue balance.  We then assembled our final humeral stem, the size 1 epi and this was impacted into the humeral shaft with excellent pin fixation.  I then performed repeat trial reductions and the +3 polyethylene insert gave the best soft tissue balance.  The final +3 epi was then impacted into position on the humeral stem and final reduction was performed.  The shoulder was taken through range of motion showing excellent stability.  Good soft tissue balance.  The wound was copiously irrigated.  Hemostasis was obtained.  The subscapularis was then repaired back to the margin of the metaphysis of the proximal humerus with #2 FiberWires, which were placed through drill holes.  The biceps tendon was then tenodesed at the upper level of the pec major.  The deltopectoral interval was then reapproximated with a series of figure-of-eight #1 Vicryl sutures. 2-0 Monocryl was used for the subcu layer and intracuticular 3-0 Monocryl for the skin  followed by Dermabond and Aquacel dressing.  Left arm was placed in a sling.  The patient was then awakened, extubated, and taken to the recovery room in a stable condition.  Tracy A. Shuford, PA-C was used as an Environmental consultant throughout this case and essential for help with positioning the patient, positioning the extremity, management of the tissue retractors, implantation of prosthesis, wound closure, and intraoperative decision making.     Metta Clines. Markel Kurtenbach, M.D.     KMS/MEDQ  D:  11/11/2014  T:  11/12/2014  Job:  [id number]

## 2014-11-12 NOTE — Progress Notes (Signed)
Occupational Therapy Treatment Patient Details Name: [name] MRN: [id number] DOB: 12-16-1929 Today's Date: 11/12/2014    History of present illness LEFT REVERSE SHOULDER ARTHROPLASTY on 11/11/2014, PMH: anxiety, depression, concussion.   OT comments  Pt making good progress toward OT goals. All shoulder education complete, pt and husband with no further questions or concerns for OT at this time. Pt able to complete L UE ROM exercises; husband demonstrated independence in assisting with shoulder exercises as needed. Pt ready to d/c from an OT standpoint. Will continue to follow pt acutely.    Follow Up Recommendations  Supervision/Assistance - 24 hour (follow up per MD)    Equipment Recommendations  None recommended by OT    Recommendations for Other Services      Precautions / Restrictions Precautions Precautions: Shoulder Type of Shoulder Precautions: Active protocol Shoulder Interventions: Shoulder sling/immobilizer;At all times;Off for dressing/bathing/exercises Precaution Comments: Reviewed precautions Required Braces or Orthoses: Sling Restrictions Weight Bearing Restrictions: Yes LUE Weight Bearing: Non weight bearing       Mobility Bed Mobility Overal bed mobility: Needs Assistance Bed Mobility: Sit to Supine       Sit to supine: Min guard   General bed mobility comments: up in chair upon arrival  Transfers Overall transfer level: Needs assistance Equipment used: None Transfers: Sit to/from [facility] Sit to Stand: Min assist Stand pivot transfers: Min guard       General transfer comment: Min assist to boost up, min guard for safety. Sit <> stand from [facility] x 2, chair x 1    Balance Overall balance assessment: Needs assistance Sitting-balance support: No upper extremity supported Sitting balance-Leahy Scale: Good     Standing balance support: Single extremity supported Standing balance-Leahy Scale: Poor Standing balance  comment: using cane                    ADL Overall ADL's : Needs assistance/impaired         Upper Body Bathing: Moderate assistance;Sitting;With caregiver independent assisting       Upper Body Dressing : Moderate assistance;Sitting;With caregiver independent assisting   Lower Body Dressing: Moderate assistance;Sitting/lateral leans   Toilet Transfer: Min guard;Stand-pivot;BSC   Toileting- Clothing Manipulation and Hygiene: Min guard;Sit to/from stand Toileting - Clothing Manipulation Details (indicate cue type and reason): Toilet hygiene and clothing manipulation     Functional mobility during ADLs: Min guard General ADL Comments: Husband present during OT session. Educated on UB dressing and bathing technique, edema management, sling management and wear schedule, LUE positioning; husband and pt verbalize understanding. Educated and demonstrated L UE ROM exercises; pt able to return demonstrate-husband independent in assisting as needed. Educated on using 3 in 1 in shower for energy conservation and safety; pts husband verbalized understanding.       Vision                     Perception     Praxis      Cognition   Behavior During Therapy: WFL for tasks assessed/performed Overall Cognitive Status: History of cognitive impairments - at baseline       Memory: Decreased recall of precautions               Extremity/Trunk Assessment  Upper Extremity Assessment Upper Extremity Assessment: Defer to OT evaluation   Lower Extremity Assessment Lower Extremity Assessment: Generalized weakness        Exercises Shoulder Exercises Shoulder Flexion: AAROM;Left;10 reps;Seated (0-60 degrees) Shoulder ABduction: AAROM;Left;10  reps;Seated (0-30 degrees) Elbow Flexion: AROM;Left;10 reps;Seated Wrist Flexion: AROM;Left;10 reps;Seated Digit Composite Flexion: AROM;Left;10 reps;Seated Donning/doffing shirt without moving shoulder: Moderate  assistance;Caregiver independent with task Method for sponge bathing under operated UE: Moderate assistance;Caregiver independent with task Donning/doffing sling/immobilizer: Minimal assistance;Caregiver independent with task Correct positioning of sling/immobilizer: Moderate assistance;Caregiver independent with task ROM for elbow, wrist and digits of operated UE: Minimal assistance;Caregiver independent with task Sling wearing schedule (on at all times/off for ADL's): Minimal assistance;Caregiver independent with task Proper positioning of operated UE when showering: Moderate assistance;Caregiver independent with task Positioning of UE while sleeping: Moderate assistance;Caregiver independent with task   Shoulder Instructions Shoulder Instructions Donning/doffing shirt without moving shoulder: Moderate assistance;Caregiver independent with task Method for sponge bathing under operated UE: Moderate assistance;Caregiver independent with task Donning/doffing sling/immobilizer: Minimal assistance;Caregiver independent with task Correct positioning of sling/immobilizer: Moderate assistance;Caregiver independent with task ROM for elbow, wrist and digits of operated UE: Minimal assistance;Caregiver independent with task Sling wearing schedule (on at all times/off for ADL's): Minimal assistance;Caregiver independent with task Proper positioning of operated UE when showering: Moderate assistance;Caregiver independent with task Positioning of UE while sleeping: Moderate assistance;Caregiver independent with task     General Comments      Pertinent Vitals/ Pain       Pain Assessment: 0-10 Pain Score: 5  Faces Pain Scale: Hurts even more Pain Location: L shoulder Pain Descriptors / Indicators: Aching;Sore;Grimacing;Guarding Pain Intervention(s): Limited activity within patient's tolerance;Monitored during session;Repositioned;Ice applied  Home Living Family/patient expects to be discharged to::  Private residence Living Arrangements: Spouse/significant other Available Help at Discharge: Family;Available 24 hours/day Type of Home: House Home Access: Stairs to enter Entrance Stairs-Number of Steps: 1 Entrance Stairs-Rails: None Home Layout: One level               Home Equipment: Cane - single point          Prior Functioning/Environment Level of Independence: Independent            Frequency Min 2X/week     Progress Toward Goals  OT Goals(current goals can now be found in the care plan section)  Progress towards OT goals: Progressing toward goals  Acute Rehab OT Goals Patient Stated Goal: go home today OT Goal Formulation: With patient  Plan Discharge plan remains appropriate    Co-evaluation                 End of Session Equipment Utilized During Treatment: Other (comment) (sling)   Activity Tolerance Patient tolerated treatment well   Patient Left in bed;with call bell/phone within reach;with family/visitor present   Nurse Communication          Time: 2025-4270 OT Time Calculation (min): 31 min  Charges: OT General Charges $OT Visit: 1 Procedure OT Treatments $Self Care/Home Management : 8-22 mins $Therapeutic Exercise: 8-22 mins  Binnie Kand M.S., OTR/L Pager: [phone]  11/12/2014, 2:57 PM

## 2014-11-12 NOTE — Evaluation (Signed)
Occupational Therapy Evaluation Patient Details Name: [name] MRN: [id number] DOB: 1929-03-16 Today's Date: 11/12/2014    History of Present Illness LEFT REVERSE SHOULDER ARTHROPLASTY on 11/11/2014   Clinical Impression   Pt reports that she occasionally required assist with bathing and dressing PTA due to pain in LUE. Educated pt and husband on UB bathing technique, edema management techniques, positioning of LUE, sling management and wear schedule; pt and husband verbalize understanding. Pt plan to d/c home with 24/7 supervision from family. Pt would benefit from continued skilled OT to maximize independence and safety with UB ADLs, functional transfers, and L UE ROM exercises prior to d/c home.      Follow Up Recommendations  Supervision/Assistance - 24 hour (follow up per MD)    Equipment Recommendations  None recommended by OT    Recommendations for Other Services PT consult     Precautions / Restrictions Precautions Precautions: Shoulder Type of Shoulder Precautions: Active protocol Shoulder Interventions: Shoulder sling/immobilizer;At all times;Off for dressing/bathing/exercises Precaution Booklet Issued: Yes (comment) Precaution Comments: Educated on precautions Required Braces or Orthoses: Sling Restrictions Weight Bearing Restrictions: Yes LUE Weight Bearing: Non weight bearing      Mobility Bed Mobility Overal bed mobility: Needs Assistance Bed Mobility: Supine to Sit     Supine to sit: Supervision     General bed mobility comments: VC for technique and maintaining NWB status  Transfers Overall transfer level: Needs assistance Equipment used: 1 person hand held assist Transfers: Sit to/from Stand Sit to Stand: Min assist         General transfer comment: Min assist for balance. VC to use R UE to boost up from bed. 3 attempts required for sit to stand from EOB    Balance Overall balance assessment: Needs assistance Sitting-balance  support: No upper extremity supported Sitting balance-Leahy Scale: Good     Standing balance support: Single extremity supported Standing balance-Leahy Scale: Poor                              ADL Overall ADL's : Needs assistance/impaired Eating/Feeding: Set up;Sitting   Grooming: Supervision/safety;Sitting   Upper Body Bathing: Moderate assistance;Sitting       Upper Body Dressing : Moderate assistance;Sitting   Lower Body Dressing: Minimal assistance;Sit to/from stand   Toilet Transfer: Minimal assistance;Stand-pivot;BSC           Functional mobility during ADLs: Minimal assistance (hand held assist ) General ADL Comments: Husband present during OT eval. Educated on UB bathing technique, edema management techniques, L UE positioning in bed or sitting, sling management and wear schedule; pt and husband verbalize understanding. Provided pt and husband with shoulder handout, asked them to review, will check back later this AM.     Vision     Perception     Praxis      Pertinent Vitals/Pain Pain Assessment: Faces Faces Pain Scale: Hurts little more Pain Location: L shoulder Pain Descriptors / Indicators: Grimacing;Aching Pain Intervention(s): Limited activity within patient's tolerance;Monitored during session;Repositioned;Ice applied     Hand Dominance Right   Extremity/Trunk Assessment Upper Extremity Assessment Upper Extremity Assessment: LUE deficits/detail LUE Deficits / Details: elbow, wrist, hand AROM WFL LUE: Unable to fully assess due to pain;Unable to fully assess due to immobilization   Lower Extremity Assessment Lower Extremity Assessment: Defer to PT evaluation       Communication Communication Communication: No difficulties   Cognition Arousal/Alertness: Awake/alert Behavior During Therapy: [facility]  for tasks assessed/performed Overall Cognitive Status: History of cognitive impairments - at baseline                      General Comments       Exercises Exercises: Shoulder     Shoulder Instructions Shoulder Instructions Method for sponge bathing under operated UE: Moderate assistance;Caregiver independent with task (educated pt and husband) Donning/doffing sling/immobilizer: Moderate assistance (educated pt and husband) Correct positioning of sling/immobilizer: Moderate assistance;Caregiver independent with task (educated pt and husband) Sling wearing schedule (on at all times/off for ADL's): Minimal assistance (educated pt and husband) Proper positioning of operated UE when showering: Moderate assistance (educated pt and husband) Positioning of UE while sleeping: Moderate assistance (educated pt and husband)    Home Living Family/patient expects to be discharged to:: Private residence Living Arrangements: Spouse/significant other Available Help at Discharge: Family;Available 24 hours/day (Daugher is Therapist, sports, coming to stay until Sunday) Type of Home: House Home Access: Stairs to enter [employer] of Steps: 1   Home Layout: One level     Bathroom Shower/Tub: Occupational psychologist: Standard Bathroom Accessibility: Yes How Accessible: Accessible via walker Home Equipment: Bedside commode;Shower seat - built in   Additional Comments: Shower seat is too high for pt      Prior Functioning/Environment Level of Independence: Needs assistance    ADL's / Homemaking Assistance Needed: occasionally needed assistance with bathing and dressing when she was having a lot of pain in LUE        OT Diagnosis: Generalized weakness;Acute pain   OT Problem List: Decreased range of motion;Decreased activity tolerance;Impaired balance (sitting and/or standing);Decreased safety awareness;Decreased knowledge of use of DME or AE;Decreased knowledge of precautions;Pain   OT Treatment/Interventions: Self-care/ADL training;Therapeutic exercise;DME and/or AE instruction;Patient/family education     OT Goals(Current goals can be found in the care plan section) Acute Rehab OT Goals Patient Stated Goal: to get better OT Goal Formulation: With patient Time For Goal Achievement: 11/26/14 Potential to Achieve Goals: Good ADL Goals Pt Will Perform Grooming: with modified independence;sitting Pt Will Perform Upper Body Bathing: with min assist;sitting Pt Will Perform Upper Body Dressing: with min assist;sitting Pt Will Transfer to Toilet: with min guard assist;ambulating;bedside commode (over toilet) Pt Will Perform Toileting - Clothing Manipulation and hygiene: with min guard assist;sit to/from stand Pt Will Perform Tub/Shower Transfer: Shower transfer;with min guard assist;ambulating;3 in 1 Pt/caregiver will Perform Home Exercise Program: Increased ROM;Left upper extremity;With minimal assist;With written HEP provided  OT Frequency: Min 2X/week   Barriers to D/C:            Co-evaluation              End of Session Equipment Utilized During Treatment: Gait belt  Activity Tolerance: Patient tolerated treatment well Patient left: in chair;with call bell/phone within reach;with family/visitor present   Time: 0921-0950 OT Time Calculation (min): 29 min Charges:  OT General Charges $OT Visit: 1 Procedure OT Evaluation $Initial OT Evaluation Tier I: 1 Procedure OT Treatments $Self Care/Home Management : 8-22 mins G-Codes:     Binnie Kand M.S., OTR/L Pager: [phone]  11/12/2014, 10:07 AM

## 2014-11-12 NOTE — Progress Notes (Signed)
Physical Therapy Treatment Patient Details Name: [name] MRN: [id number] DOB: September 15, 1929 Today's Date: 11/12/2014    History of Present Illness LEFT REVERSE SHOULDER ARTHROPLASTY on 11/11/2014, PMH: anxiety, depression, concussion.    PT Comments    Patient is making good progress with PT.  From a mobility standpoint anticipate patient will be ready for DC home with family support. Discussed with patient and spouse recommendation for supervision with all mobility at this time. Patient and spouse confirm understanding of recommendation and deny any questions or concerns. Patient with significantly improved stability during second PT session.      Follow Up Recommendations  No PT follow up;Supervision/Assistance - 24 hour     Equipment Recommendations  None recommended by PT    Recommendations for Other Services       Precautions / Restrictions  Precautions Precautions: Shoulder Type of Shoulder Precautions: Active protocol Shoulder Interventions: Shoulder sling/immobilizer;At all times;Off for dressing/bathing/exercises Required Braces or Orthoses: Sling Restrictions Weight Bearing Restrictions: Yes LUE Weight Bearing: Non weight bearing   Mobility  Bed Mobility       General bed mobility comments: up in chair upon arrival  Transfers Overall transfer level: Needs assistance Equipment used: None Transfers: Sit to/from Stand Sit to Stand: Min guard Stand pivot transfers: Min guard       General transfer comment: stable transfers  Ambulation/Gait Ambulation/Gait assistance: Min guard Ambulation Distance (Feet): 200 Feet Assistive device: Straight cane Gait Pattern/deviations: Step-through pattern Gait velocity: decreased   General Gait Details: no loss of balance, intermittant reorientation to task, mild instability X2 with independent correction.    Stairs   Wheelchair Mobility    Modified Rankin (Stroke Patients Only)       Balance  Overall balance assessment: Needs assistance Sitting-balance support: No upper extremity supported Sitting balance-Leahy Scale: Good     Standing balance support: No upper extremity supported Standing balance-Leahy Scale: Fair Standing balance comment: using cane                     Cognition Arousal/Alertness: Awake/alert Behavior During Therapy: WFL for tasks assessed/performed Overall Cognitive Status: History of cognitive impairments - at baseline       Memory: Decreased recall of precautions              Exercises     General Comments        Pertinent Vitals/Pain Pain Assessment: Faces Pain Score: 5  Faces Pain Scale: Hurts a little bit Pain Location: Lt shoulder Pain Descriptors / Indicators: Aching Pain Intervention(s): Limited activity within patient's tolerance;Monitored during session    Home Living       Prior Function          PT Goals (current goals can now be found in the care plan section) Acute Rehab PT Goals Patient Stated Goal: go home today PT Goal Formulation: With patient Time For Goal Achievement: 11/26/14 Potential to Achieve Goals: Good Progress towards PT goals: Progressing toward goals    Frequency  Min 3X/week    PT Plan Current plan remains appropriate    Co-evaluation             End of Session Equipment Utilized During Treatment: Gait belt Activity Tolerance: Patient tolerated treatment well Patient left: in chair;with call bell/phone within reach;with family/visitor present     Time: 7124-5809 PT Time Calculation (min) (ACUTE ONLY): 12 min  Charges:  $Gait Training: 8-22 mins  G Codes:      Cassell Clement, PT, CSCS Pager [phone] Office [phone]  11/12/2014, 3:32 PM

## 2014-11-16 DIAGNOSIS — K219 Gastro-esophageal reflux disease without esophagitis: Secondary | ICD-10-CM | POA: Diagnosis not present

## 2014-11-16 DIAGNOSIS — Z4789 Encounter for other orthopedic aftercare: Secondary | ICD-10-CM | POA: Diagnosis not present

## 2014-11-16 DIAGNOSIS — F341 Dysthymic disorder: Secondary | ICD-10-CM | POA: Diagnosis not present

## 2014-11-16 DIAGNOSIS — M1990 Unspecified osteoarthritis, unspecified site: Secondary | ICD-10-CM | POA: Diagnosis not present

## 2014-11-16 DIAGNOSIS — E785 Hyperlipidemia, unspecified: Secondary | ICD-10-CM | POA: Diagnosis not present

## 2014-11-18 DIAGNOSIS — K219 Gastro-esophageal reflux disease without esophagitis: Secondary | ICD-10-CM | POA: Diagnosis not present

## 2014-11-18 DIAGNOSIS — E785 Hyperlipidemia, unspecified: Secondary | ICD-10-CM | POA: Diagnosis not present

## 2014-11-18 DIAGNOSIS — F341 Dysthymic disorder: Secondary | ICD-10-CM | POA: Diagnosis not present

## 2014-11-18 DIAGNOSIS — M1990 Unspecified osteoarthritis, unspecified site: Secondary | ICD-10-CM | POA: Diagnosis not present

## 2014-11-18 DIAGNOSIS — Z4789 Encounter for other orthopedic aftercare: Secondary | ICD-10-CM | POA: Diagnosis not present

## 2014-11-19 DIAGNOSIS — M1990 Unspecified osteoarthritis, unspecified site: Secondary | ICD-10-CM | POA: Diagnosis not present

## 2014-11-19 DIAGNOSIS — E785 Hyperlipidemia, unspecified: Secondary | ICD-10-CM | POA: Diagnosis not present

## 2014-11-19 DIAGNOSIS — F341 Dysthymic disorder: Secondary | ICD-10-CM | POA: Diagnosis not present

## 2014-11-19 DIAGNOSIS — K219 Gastro-esophageal reflux disease without esophagitis: Secondary | ICD-10-CM | POA: Diagnosis not present

## 2014-11-19 DIAGNOSIS — Z4789 Encounter for other orthopedic aftercare: Secondary | ICD-10-CM | POA: Diagnosis not present

## 2014-11-22 DIAGNOSIS — Z4789 Encounter for other orthopedic aftercare: Secondary | ICD-10-CM | POA: Diagnosis not present

## 2014-11-22 DIAGNOSIS — M1990 Unspecified osteoarthritis, unspecified site: Secondary | ICD-10-CM | POA: Diagnosis not present

## 2014-11-22 DIAGNOSIS — K219 Gastro-esophageal reflux disease without esophagitis: Secondary | ICD-10-CM | POA: Diagnosis not present

## 2014-11-22 DIAGNOSIS — F341 Dysthymic disorder: Secondary | ICD-10-CM | POA: Diagnosis not present

## 2014-11-22 DIAGNOSIS — E785 Hyperlipidemia, unspecified: Secondary | ICD-10-CM | POA: Diagnosis not present

## 2014-11-24 DIAGNOSIS — F341 Dysthymic disorder: Secondary | ICD-10-CM | POA: Diagnosis not present

## 2014-11-24 DIAGNOSIS — M1990 Unspecified osteoarthritis, unspecified site: Secondary | ICD-10-CM | POA: Diagnosis not present

## 2014-11-24 DIAGNOSIS — Z4789 Encounter for other orthopedic aftercare: Secondary | ICD-10-CM | POA: Diagnosis not present

## 2014-11-24 DIAGNOSIS — K219 Gastro-esophageal reflux disease without esophagitis: Secondary | ICD-10-CM | POA: Diagnosis not present

## 2014-11-24 DIAGNOSIS — Z471 Aftercare following joint replacement surgery: Secondary | ICD-10-CM | POA: Diagnosis not present

## 2014-11-24 DIAGNOSIS — Z96612 Presence of left artificial shoulder joint: Secondary | ICD-10-CM | POA: Diagnosis not present

## 2014-11-24 DIAGNOSIS — E785 Hyperlipidemia, unspecified: Secondary | ICD-10-CM | POA: Diagnosis not present

## 2014-11-26 DIAGNOSIS — E785 Hyperlipidemia, unspecified: Secondary | ICD-10-CM | POA: Diagnosis not present

## 2014-11-26 DIAGNOSIS — F341 Dysthymic disorder: Secondary | ICD-10-CM | POA: Diagnosis not present

## 2014-11-26 DIAGNOSIS — M1990 Unspecified osteoarthritis, unspecified site: Secondary | ICD-10-CM | POA: Diagnosis not present

## 2014-11-26 DIAGNOSIS — Z4789 Encounter for other orthopedic aftercare: Secondary | ICD-10-CM | POA: Diagnosis not present

## 2014-11-26 DIAGNOSIS — K219 Gastro-esophageal reflux disease without esophagitis: Secondary | ICD-10-CM | POA: Diagnosis not present

## 2014-11-29 DIAGNOSIS — M25512 Pain in left shoulder: Secondary | ICD-10-CM | POA: Diagnosis not present

## 2014-12-02 DIAGNOSIS — M25512 Pain in left shoulder: Secondary | ICD-10-CM | POA: Diagnosis not present

## 2014-12-06 DIAGNOSIS — M25512 Pain in left shoulder: Secondary | ICD-10-CM | POA: Diagnosis not present

## 2014-12-12 ENCOUNTER — Emergency Department (HOSPITAL_COMMUNITY)
Admission: EM | Admit: 2014-12-12 | Discharge: 2014-12-12 | Disposition: A | Discharge status: Home / Self Care | Payer: Commercial Managed Care - HMO | Attending: Emergency Medicine | Admitting: Emergency Medicine

## 2014-12-12 ENCOUNTER — Encounter (HOSPITAL_COMMUNITY): Payer: Self-pay | Admitting: *Deleted

## 2014-12-12 ENCOUNTER — Emergency Department (HOSPITAL_COMMUNITY): Payer: Commercial Managed Care - HMO

## 2014-12-12 DIAGNOSIS — E785 Hyperlipidemia, unspecified: Secondary | ICD-10-CM | POA: Diagnosis not present

## 2014-12-12 DIAGNOSIS — R1011 Right upper quadrant pain: Secondary | ICD-10-CM

## 2014-12-12 DIAGNOSIS — K8020 Calculus of gallbladder without cholecystitis without obstruction: Secondary | ICD-10-CM | POA: Insufficient documentation

## 2014-12-12 DIAGNOSIS — F329 Major depressive disorder, single episode, unspecified: Secondary | ICD-10-CM | POA: Insufficient documentation

## 2014-12-12 DIAGNOSIS — Z87891 Personal history of nicotine dependence: Secondary | ICD-10-CM | POA: Insufficient documentation

## 2014-12-12 DIAGNOSIS — M25512 Pain in left shoulder: Secondary | ICD-10-CM | POA: Diagnosis not present

## 2014-12-12 DIAGNOSIS — K219 Gastro-esophageal reflux disease without esophagitis: Secondary | ICD-10-CM | POA: Diagnosis not present

## 2014-12-12 DIAGNOSIS — F419 Anxiety disorder, unspecified: Secondary | ICD-10-CM | POA: Insufficient documentation

## 2014-12-12 DIAGNOSIS — Z853 Personal history of malignant neoplasm of breast: Secondary | ICD-10-CM | POA: Insufficient documentation

## 2014-12-12 DIAGNOSIS — Z7982 Long term (current) use of aspirin: Secondary | ICD-10-CM | POA: Diagnosis not present

## 2014-12-12 DIAGNOSIS — M1990 Unspecified osteoarthritis, unspecified site: Secondary | ICD-10-CM | POA: Insufficient documentation

## 2014-12-12 DIAGNOSIS — Z79899 Other long term (current) drug therapy: Secondary | ICD-10-CM | POA: Diagnosis not present

## 2014-12-12 LAB — CBC
HEMATOCRIT: 41.8 % (ref 36.0–46.0)
Hemoglobin: 13.5 g/dL (ref 12.0–15.0)
MCH: 30 pg (ref 26.0–34.0)
MCHC: 32.3 g/dL (ref 30.0–36.0)
MCV: 92.9 fL (ref 78.0–100.0)
PLATELETS: 284 10*3/uL (ref 150–400)
RBC: 4.5 MIL/uL (ref 3.87–5.11)
RDW: 14.7 % (ref 11.5–15.5)
WBC: 10.1 10*3/uL (ref 4.0–10.5)

## 2014-12-12 LAB — COMPREHENSIVE METABOLIC PANEL
ALBUMIN: 3.5 g/dL (ref 3.5–5.0)
ALT: 50 U/L (ref 14–54)
AST: 105 U/L — AB (ref 15–41)
Alkaline Phosphatase: 92 U/L (ref 38–126)
Anion gap: 7 (ref 5–15)
BILIRUBIN TOTAL: 0.8 mg/dL (ref 0.3–1.2)
BUN: 11 mg/dL (ref 6–20)
CO2: 27 mmol/L (ref 22–32)
CREATININE: 0.83 mg/dL (ref 0.44–1.00)
Calcium: 9.2 mg/dL (ref 8.9–10.3)
Chloride: 104 mmol/L (ref 101–111)
GFR calc Af Amer: >60 mL/min (ref >60)
GFR calc non Af Amer: >60 mL/min (ref >60)
GLUCOSE: 130 mg/dL — AB (ref 65–99)
POTASSIUM: 3.9 mmol/L (ref 3.5–5.1)
Sodium: 138 mmol/L (ref 135–145)
TOTAL PROTEIN: 6.1 g/dL — AB (ref 6.5–8.1)

## 2014-12-12 LAB — URINALYSIS, ROUTINE W REFLEX MICROSCOPIC
BILIRUBIN URINE: NEGATIVE
GLUCOSE, UA: NEGATIVE mg/dL
Hgb urine dipstick: NEGATIVE
Ketones, ur: 15 mg/dL — AB
NITRITE: NEGATIVE
PH: 5 (ref 5.0–8.0)
Protein, ur: NEGATIVE mg/dL
Specific Gravity, Urine: 1.029 (ref 1.005–1.030)

## 2014-12-12 LAB — URINE MICROSCOPIC-ADD ON
BACTERIA UA: NONE SEEN
RBC / HPF: NONE SEEN RBC/hpf (ref 0–5)

## 2014-12-12 LAB — LIPASE, BLOOD: Lipase: 29 U/L (ref 11–51)

## 2014-12-12 NOTE — ED Notes (Signed)
Pt reports lower rt quadrant pain with generalized abdominal tenderness earlier this afternoon, reports hx of diverticulosis and reports eating some granola and a hot dog before the pain began. Pt states she is pain free at this time, no abdominal tenderness at present. Pt denies n/v/d.

## 2014-12-12 NOTE — ED Provider Notes (Signed)
CSN: [id number]     Arrival date & time 12/12/14  1541 History   First MD Initiated Contact with Patient 12/12/14 1753     Chief Complaint  Patient presents with  . Abdominal Pain     (Consider location/radiation/quality/duration/timing/severity/associated sxs/prior Treatment) Patient is a 81 y.o. female presenting with abdominal pain. The history is provided by the patient.  Abdominal Pain Pain location:  RUQ Pain quality: aching, gnawing, sharp and shooting   Pain radiates to:  Does not radiate Pain severity:  Severe Onset quality:  Sudden Duration:  3 hours Timing:  Constant Progression:  Resolved Chronicity:  New Context comment:  Started approximately 1 hour after eating a hotdog and soup today Relieved by:  Nothing Worsened by:  Nothing tried Ineffective treatments:  Antacids (Pepto-Bismol and Alka-Seltzer) Associated symptoms: anorexia   Associated symptoms: no cough, no diarrhea, no fever, no nausea, no shortness of breath and no vomiting   Risk factors: being elderly, multiple surgeries and recent hospitalization   Risk factors: no NSAID use     Past Medical History  Diagnosis Date  . Diverticulosis   . Allergic rhinitis   . Hyperlipidemia   . Headache(784.0)     sinus HAs  . Breast cancer ([place])     left breast cancer  . Arthritis   . Concussion   . Anxiety   . Depression   . GERD (gastroesophageal reflux disease)    Past Surgical History  Procedure Laterality Date  . Total knee arthroplasty      right  . Nasal sinus surgery      2004  . Replacement total knee      left  . Abdominal hysterectomy    . Mastectomy      left  . Shoulder surgery Bilateral     repairs  . Esophagogastroduodenoscopy N/A 08/15/2012    Procedure: ESOPHAGOGASTRODUODENOSCOPY (EGD);  Surgeon: Inda Castle, MD;  Location: [facility];  Service: Endoscopy;  Laterality: N/A;  . Reverse shoulder arthroplasty Left 11/11/2014    Procedure: LEFT REVERSE SHOULDER ARTHROPLASTY;   Surgeon: Justice Britain, MD;  Location: [facility];  Service: Orthopedics;  Laterality: Left;   Family History  Problem Relation Age of Onset  . Colon cancer Father 47  . Breast cancer Mother   . Pancreatic cancer Sister   . Leukemia Son    Social History  Substance Use Topics  . Smoking status: Former Smoker -- 0.50 packs/day for 6 years    Types: Cigarettes    Quit date: 01/15/1958  . Smokeless tobacco: Never Used  . Alcohol Use: No   OB History    No data available     Review of Systems  Constitutional: Negative for fever.  Respiratory: Negative for cough and shortness of breath.   Gastrointestinal: Positive for abdominal pain and anorexia. Negative for nausea, vomiting and diarrhea.  All other systems reviewed and are negative.     Allergies  Dust mite extract and Other  Home Medications   Prior to Admission medications   Medication Sig Start Date End Date Taking? Authorizing Provider  aspirin EC 81 MG tablet Take 81 mg by mouth at bedtime.   Yes Historical Provider, MD  Calcium Carb-Cholecalciferol (CALCIUM 600 + D PO) Take 600 mg by mouth 2 (two) times daily.   Yes Historical Provider, MD  Cholecalciferol 2000 UNITS TABS Take 2,000 Units by mouth at bedtime.   Yes Historical Provider, MD  esomeprazole (NEXIUM) 20 MG capsule Take 20 mg by mouth  every evening.   Yes Historical Provider, MD  fexofenadine (ALLEGRA) 180 MG tablet Take 180 mg by mouth every other day.    Yes Historical Provider, MD  fluticasone (FLONASE) 50 MCG/ACT nasal spray Place 1 spray into both nostrils daily as needed for allergies or rhinitis.   Yes Historical Provider, MD  gabapentin (NEURONTIN) 300 MG capsule Take 1 capsule (300 mg total) by mouth 3 (three) times daily. 08/09/14  Yes Penni Bombard, MD  LORazepam (ATIVAN) 0.5 MG tablet Take 0.5 mg by mouth every 8 (eight) hours as needed for anxiety.   Yes Historical Provider, MD  Multiple Vitamin (MULTIVITAMIN WITH MINERALS) TABS tablet Take 1  tablet by mouth daily.   Yes Historical Provider, MD  nortriptyline (PAMELOR) 25 MG capsule Take 25 mg by mouth at bedtime.   Yes Historical Provider, MD  Omega-3 Fatty Acids (FISH OIL) 1200 MG CAPS Take 1,200 mg by mouth 2 (two) times daily.   Yes Historical Provider, MD  oxyCODONE-acetaminophen (PERCOCET) 5-325 MG tablet Take 1-2 tablets by mouth every 4 (four) hours as needed. 11/12/14  Yes Tracy Shuford, PA-C  raloxifene (EVISTA) 60 MG tablet Take 60 mg by mouth daily.   Yes Historical Provider, MD  vitamin B-12 (CYANOCOBALAMIN) 1000 MCG tablet Take 1,000 mcg by mouth daily.   Yes Historical Provider, MD  zolpidem (AMBIEN) 10 MG tablet Take 3.33 mg by mouth at bedtime as needed for sleep. 1/3 tablet   Yes Historical Provider, MD  diazepam (VALIUM) 5 MG tablet Take 0.5-1 tablets (2.5-5 mg total) by mouth every 6 (six) hours as needed for muscle spasms or sedation. 11/12/14   Tracy Shuford, PA-C   BP 140/73 mmHg  Pulse 64  Temp(Src) 97.9 F (36.6 C) (Oral)  Resp 18  Ht 5\' 3"  (1.6 m)  Wt 158 lb (71.668 kg)  BMI 28.00 kg/m2  SpO2 99% Physical Exam  Constitutional: She is oriented to person, place, and time. She appears well-developed and well-nourished. No distress.  HENT:  Head: Normocephalic and atraumatic.  Mouth/Throat: Oropharynx is clear and moist.  Eyes: Conjunctivae and EOM are normal. Pupils are equal, round, and reactive to light.  Neck: Normal range of motion. Neck supple.  Cardiovascular: Normal rate, regular rhythm and intact distal pulses.   No murmur heard. Pulmonary/Chest: Effort normal and breath sounds normal. No respiratory distress. She has no wheezes. She has no rales.  Abdominal: Soft. She exhibits no distension. There is no tenderness. There is no rebound, no guarding and negative Murphy's sign.  Musculoskeletal: Normal range of motion. She exhibits no edema or tenderness.  Healing ecchymosis and mild swelling of the left shoulder  Neurological: She is alert and  oriented to person, place, and time.  Skin: Skin is warm and dry. No rash noted. No erythema.  Psychiatric: She has a normal mood and affect. Her behavior is normal.  Nursing note and vitals reviewed.   ED Course  Procedures (including critical care time) Labs Review Labs Reviewed  COMPREHENSIVE METABOLIC PANEL - Abnormal; Notable for the following:    Glucose, Bld 130 (*)    Total Protein 6.1 (*)    AST 105 (*)    All other components within normal limits  URINALYSIS, ROUTINE W REFLEX MICROSCOPIC (NOT AT [facility]) - Abnormal; Notable for the following:    Color, Urine AMBER (*)    APPearance CLOUDY (*)    Ketones, ur 15 (*)    Leukocytes, UA TRACE (*)    All other components within  normal limits  URINE MICROSCOPIC-ADD ON - Abnormal; Notable for the following:    Squamous Epithelial / LPF 0-5 (*)    Crystals CA OXALATE CRYSTALS (*)    All other components within normal limits  LIPASE, BLOOD  CBC    Imaging Review US Abdomen Limited Ruq  12/12/2014  CLINICAL DATA:  Right upper quadrant pain for 1 day EXAM: US ABDOMEN LIMITED - RIGHT UPPER QUADRANT COMPARISON:  None. FINDINGS: Gallbladder: Two echogenic foci within the lumen of the gallbladder measuring 6 and 5 mm respectively. These appear relatively flat light. Cannot assess for mobility as patient could not be repositioned. No sonographic Murphy sign Common bile duct: Diameter: 5.5 mm proximally tapering to 3.8 mm distally all Liver: Increased echogenicity diffusely likely representing steatosis. IMPRESSION: Two gallstones versus gallbladder polyps, more likely calculi. Recommend repeat evaluation when the patient can be repositioned to determine if these are mobile. Electronically Signed   By: Skipper Cliche M.D.   On: 12/12/2014 19:34   I have personally reviewed and evaluated these images and lab results as part of my medical decision-making.   EKG Interpretation None      MDM   Final diagnoses:  Calculus of gallbladder  without cholecystitis without obstruction    Patient is an 81 year old female presenting with symptoms classic for biliary colic. It started about 1 hour after eating a hot dog today. She had severe pain in the right upper quadrant which is now resolved. Patient currently has no abdominal pain or Murphy sign. Labs are significant only for a CMP with a elevation of AST of 105. Patient states that she recently had surgery has been taking a lot of Tylenol but denies any alcohol use. Total bilirubin and lipase are within normal limits. CBC within normal limits.  Right upper quadrant ultrasound shows cholelithiasis without evidence of common bile duct dilation.  Patient remains pain free. She was discharged home to follow-up with general surgery for an elective cholecystectomy  Blanchie Dessert, MD 12/13/14 0002

## 2014-12-12 NOTE — ED Notes (Signed)
Pt c/o RUQ pain onset today @ 14:00 post eating a hotdog today, pt denies n/v, pt reports  x 3 formed stools since that time, denies CP, SOB, pt reports recent L shoulder replacement, pt A&O x4, follows commands, speaks in complete sentences

## 2014-12-12 NOTE — Discharge Instructions (Signed)
Biliary Colic °Biliary colic is a pain in the upper abdomen. The pain: °· Is usually felt on the right side of the abdomen, but it may also be felt in the center of the abdomen, just below the breastbone (sternum). °· May spread back toward the right shoulder blade. °· May be steady or irregular. °· May be accompanied by nausea and vomiting. °Most of the time, the pain goes away in 1-5 hours. After the most intense pain passes, the abdomen may continue to ache mildly for about 24 hours. °Biliary colic is caused by a blockage in the bile duct. The bile duct is a pathway that carries bile--a liquid that helps to digest fats--from the gallbladder to the small intestine. Biliary colic usually occurs after eating, when the digestive system demands bile. The pain develops when muscle cells contract forcefully to try to move the blockage so that bile can get by. °HOME CARE INSTRUCTIONS °· Take medicines only as directed by your health care provider. °· Drink enough fluid to keep your urine clear or pale yellow. °· Avoid fatty, greasy, and fried foods. These kinds of foods increase your body's demand for bile. °· Avoid any foods that make your pain worse. °· Avoid overeating. °· Avoid having a large meal after fasting. °SEEK MEDICAL CARE IF: °· You develop a fever. °· Your pain gets worse. °· You vomit. °· You develop nausea that prevents you from eating and drinking. °SEEK IMMEDIATE MEDICAL CARE IF: °· You suddenly develop a fever and shaking chills. °· You develop a yellowish discoloration (jaundice) of: °¨ Skin. °¨ Whites of the eyes. °¨ Mucous membranes. °· You have continuous or severe pain that is not relieved with medicines. °· You have nausea and vomiting that is not relieved with medicines. °· You develop dizziness or you faint. °  °This information is not intended to replace advice given to you by your health care provider. Make sure you discuss any questions you have with your health care provider. °  °Document  Released: 06/04/2005 Document Revised: 05/18/2014 Document Reviewed: 10/13/2013 °Elsevier Interactive Patient Education ©2016 [employer] ° °

## 2014-12-13 DIAGNOSIS — M25512 Pain in left shoulder: Secondary | ICD-10-CM | POA: Diagnosis not present

## 2014-12-15 DIAGNOSIS — M1990 Unspecified osteoarthritis, unspecified site: Secondary | ICD-10-CM | POA: Diagnosis not present

## 2014-12-15 DIAGNOSIS — B349 Viral infection, unspecified: Secondary | ICD-10-CM | POA: Diagnosis not present

## 2014-12-15 DIAGNOSIS — R509 Fever, unspecified: Secondary | ICD-10-CM | POA: Diagnosis not present

## 2014-12-15 DIAGNOSIS — Z6827 Body mass index (BMI) 27.0-27.9, adult: Secondary | ICD-10-CM | POA: Diagnosis not present

## 2014-12-15 DIAGNOSIS — R52 Pain, unspecified: Secondary | ICD-10-CM | POA: Diagnosis not present

## 2014-12-20 DIAGNOSIS — M25512 Pain in left shoulder: Secondary | ICD-10-CM | POA: Diagnosis not present

## 2014-12-22 DIAGNOSIS — K8020 Calculus of gallbladder without cholecystitis without obstruction: Secondary | ICD-10-CM | POA: Diagnosis not present

## 2014-12-24 DIAGNOSIS — M25512 Pain in left shoulder: Secondary | ICD-10-CM | POA: Diagnosis not present

## 2014-12-27 DIAGNOSIS — M810 Age-related osteoporosis without current pathological fracture: Secondary | ICD-10-CM | POA: Diagnosis not present

## 2014-12-27 DIAGNOSIS — E784 Other hyperlipidemia: Secondary | ICD-10-CM | POA: Diagnosis not present

## 2014-12-27 DIAGNOSIS — E038 Other specified hypothyroidism: Secondary | ICD-10-CM | POA: Diagnosis not present

## 2014-12-27 DIAGNOSIS — N390 Urinary tract infection, site not specified: Secondary | ICD-10-CM | POA: Diagnosis not present

## 2014-12-27 DIAGNOSIS — R8299 Other abnormal findings in urine: Secondary | ICD-10-CM | POA: Diagnosis not present

## 2014-12-27 DIAGNOSIS — M25512 Pain in left shoulder: Secondary | ICD-10-CM | POA: Diagnosis not present

## 2014-12-28 ENCOUNTER — Ambulatory Visit: Payer: Commercial Managed Care - HMO | Admitting: Diagnostic Neuroimaging

## 2014-12-29 DIAGNOSIS — M25512 Pain in left shoulder: Secondary | ICD-10-CM | POA: Diagnosis not present

## 2014-12-31 DIAGNOSIS — M25512 Pain in left shoulder: Secondary | ICD-10-CM | POA: Diagnosis not present

## 2015-01-03 DIAGNOSIS — D692 Other nonthrombocytopenic purpura: Secondary | ICD-10-CM | POA: Diagnosis not present

## 2015-01-03 DIAGNOSIS — F419 Anxiety disorder, unspecified: Secondary | ICD-10-CM | POA: Diagnosis not present

## 2015-01-03 DIAGNOSIS — E784 Other hyperlipidemia: Secondary | ICD-10-CM | POA: Diagnosis not present

## 2015-01-03 DIAGNOSIS — K279 Peptic ulcer, site unspecified, unspecified as acute or chronic, without hemorrhage or perforation: Secondary | ICD-10-CM | POA: Diagnosis not present

## 2015-01-03 DIAGNOSIS — W1849XA Other slipping, tripping and stumbling without falling, initial encounter: Secondary | ICD-10-CM | POA: Diagnosis not present

## 2015-01-03 DIAGNOSIS — M25512 Pain in left shoulder: Secondary | ICD-10-CM | POA: Diagnosis not present

## 2015-01-03 DIAGNOSIS — G629 Polyneuropathy, unspecified: Secondary | ICD-10-CM | POA: Diagnosis not present

## 2015-01-03 DIAGNOSIS — Z0000 Encounter for general adult medical examination without abnormal findings: Secondary | ICD-10-CM | POA: Diagnosis not present

## 2015-01-03 DIAGNOSIS — K8018 Calculus of gallbladder with other cholecystitis without obstruction: Secondary | ICD-10-CM | POA: Diagnosis not present

## 2015-01-05 DIAGNOSIS — H524 Presbyopia: Secondary | ICD-10-CM | POA: Diagnosis not present

## 2015-01-05 DIAGNOSIS — H0015 Chalazion left lower eyelid: Secondary | ICD-10-CM | POA: Diagnosis not present

## 2015-01-06 ENCOUNTER — Ambulatory Visit: Payer: Self-pay | Admitting: General Surgery

## 2015-01-06 DIAGNOSIS — M25512 Pain in left shoulder: Secondary | ICD-10-CM | POA: Diagnosis not present

## 2015-01-12 DIAGNOSIS — M25512 Pain in left shoulder: Secondary | ICD-10-CM | POA: Diagnosis not present

## 2015-01-14 DIAGNOSIS — M25512 Pain in left shoulder: Secondary | ICD-10-CM | POA: Diagnosis not present

## 2015-01-18 DIAGNOSIS — M25512 Pain in left shoulder: Secondary | ICD-10-CM | POA: Diagnosis not present

## 2015-01-20 ENCOUNTER — Encounter: Payer: Self-pay | Admitting: Diagnostic Neuroimaging

## 2015-01-20 ENCOUNTER — Ambulatory Visit (INDEPENDENT_AMBULATORY_CARE_PROVIDER_SITE_OTHER): Payer: Commercial Managed Care - HMO | Admitting: Diagnostic Neuroimaging

## 2015-01-20 VITALS — BP 129/83 | HR 68 | Wt 156.0 lb

## 2015-01-20 DIAGNOSIS — M25512 Pain in left shoulder: Secondary | ICD-10-CM | POA: Diagnosis not present

## 2015-01-20 DIAGNOSIS — F0781 Postconcussional syndrome: Secondary | ICD-10-CM | POA: Diagnosis not present

## 2015-01-20 DIAGNOSIS — M5416 Radiculopathy, lumbar region: Secondary | ICD-10-CM

## 2015-01-20 DIAGNOSIS — G609 Hereditary and idiopathic neuropathy, unspecified: Secondary | ICD-10-CM | POA: Diagnosis not present

## 2015-01-20 NOTE — Progress Notes (Signed)
[facility]  PATIENT: [name] DOB: [birth year]  REFERRING CLINICIAN: Avva HISTORY FROM: patient  REASON FOR VISIT: follow up    HISTORICAL  CHIEF COMPLAINT:  Chief Complaint  Patient presents with  . Headache    rm 7, husband -Doren Custard, "increasing in intensity/freq since fall 08-23-2014"    HISTORY OF PRESENT ILLNESS:   UPDATE 01/20/15: Since last visit, was stable, until Dec 2016, then more headaches. Now HA are stabilized. Some more insomnia, anxiety issues, reports still grieving re: son's death in Aug 22, 2013.   UPDATE 05-04-14: Since last vist doing much better with PT evaluation and exercises ([facility]). She is pleased with progress. She feels stronger and and safer with her balance.  UPDATE 03/08/14: Since last visit, patient was traveling overseas to [place] when she tripped and fell on a concrete slab, falling on her right face and right wrist. Patient suffered significant right periorbital ecchymosis, facial bruising, as well as right forearm fracture. Patient continues to have pain in her right head, headaches, balance difficulty. Patient follows up to further investigate neuropathy.  PRIOR HPI (01/20/14): 82 year old right-handed female with hypercholesterolemia, breast cancer, here for evaluation of right scalp and facial discomfort, gait difficulty, postconcussion syndrome. 11/18/2013 patient tripped on her driveway, fell and possibly hit her car in the driveway. She then bounced backwards and landed on her left hip. She had significant left hip pain but didn't feel any had pain initially. After going inside should be developed dizziness clamminess, possible low blood pressure and low pulse rate, and went to the emergency room. Patient was diagnosed with concussion. Since that time she has continued discomfort in her right scalp, especially when her hair is minimally dilated. She has pain that radiates down to her right facial region. Patient  has had ongoing intermittent pain, went to the emergency room second time, diagnosed with possible trigeminal neuralgia. Also with remote shingles in the right frontal V1 region many years ago. Patient also having progressive gait and balance difficulty, with at least 5-6 years of numbness and tingling in the toes. Apparently she has been diagnosed with neuropathy, but without specific cause.   REVIEW OF SYSTEMS: Full 14 system review of systems performed and notable only for eye discharge fatigue.   ALLERGIES: Allergies  Allergen Reactions  . Dust Mite Extract Other (See Comments)    Sinus drainage  . Other Other (See Comments)    Smoke, Cat Dander cause sinus drainage    HOME MEDICATIONS: Outpatient Prescriptions Prior to Visit  Medication Sig Dispense Refill  . aspirin EC 81 MG tablet Take 81 mg by mouth at bedtime.    . Calcium Carb-Cholecalciferol (CALCIUM 600 + D PO) Take 600 mg by mouth 2 (two) times daily.    . Cholecalciferol 2000 UNITS TABS Take 2,000 Units by mouth at bedtime.    Marland Kitchen esomeprazole (NEXIUM) 20 MG capsule Take 20 mg by mouth every evening.    . fexofenadine (ALLEGRA) 180 MG tablet Take 180 mg by mouth every other day.     . fluticasone (FLONASE) 50 MCG/ACT nasal spray Place 1 spray into both nostrils daily as needed for allergies or rhinitis.    Marland Kitchen LORazepam (ATIVAN) 0.5 MG tablet Take 0.5 mg by mouth every 8 (eight) hours as needed for anxiety.    . Multiple Vitamin (MULTIVITAMIN WITH MINERALS) TABS tablet Take 1 tablet by mouth daily.    . nortriptyline (PAMELOR) 25 MG capsule Take 25 mg by mouth at bedtime.    Marland Kitchen  Omega-3 Fatty Acids (FISH OIL) 1200 MG CAPS Take 1,200 mg by mouth 2 (two) times daily.    . raloxifene (EVISTA) 60 MG tablet Take 60 mg by mouth daily.    . vitamin B-12 (CYANOCOBALAMIN) 1000 MCG tablet Take 1,000 mcg by mouth daily.    Marland Kitchen zolpidem (AMBIEN) 10 MG tablet Take 3.33 mg by mouth at bedtime as needed for sleep. 1/3 tablet    . gabapentin  (NEURONTIN) 300 MG capsule Take 1 capsule (300 mg total) by mouth 3 (three) times daily. 270 capsule 1  . diazepam (VALIUM) 5 MG tablet Take 0.5-1 tablets (2.5-5 mg total) by mouth every 6 (six) hours as needed for muscle spasms or sedation. (Patient not taking: Reported on 01/20/2015) 40 tablet 1  . oxyCODONE-acetaminophen (PERCOCET) 5-325 MG tablet Take 1-2 tablets by mouth every 4 (four) hours as needed. 60 tablet 0   No facility-administered medications prior to visit.    PAST MEDICAL HISTORY: Past Medical History  Diagnosis Date  . Diverticulosis   . Allergic rhinitis   . Hyperlipidemia   . Headache(784.0)     sinus HAs  . Breast cancer ([place])     left breast cancer  . Arthritis   . Concussion   . Anxiety   . Depression   . GERD (gastroesophageal reflux disease)     PAST SURGICAL HISTORY: Past Surgical History  Procedure Laterality Date  . Total knee arthroplasty      right  . Nasal sinus surgery      2004  . Replacement total knee      left  . Abdominal hysterectomy    . Mastectomy      left  . Shoulder surgery Bilateral     repairs, rot cuff repair  . Esophagogastroduodenoscopy N/A 08/15/2012    Procedure: ESOPHAGOGASTRODUODENOSCOPY (EGD);  Surgeon: Inda Castle, MD;  Location: [facility];  Service: Endoscopy;  Laterality: N/A;  . Reverse shoulder arthroplasty Left 11/11/2014    Procedure: LEFT REVERSE SHOULDER ARTHROPLASTY;  Surgeon: Justice Britain, MD;  Location: [facility];  Service: Orthopedics;  Laterality: Left;    FAMILY HISTORY: Family History  Problem Relation Age of Onset  . Colon cancer Father 23  . Breast cancer Mother   . Pancreatic cancer Sister   . Leukemia Son     SOCIAL HISTORY:  Social History   Social History  . Marital Status: Married    Spouse Name: Doren Custard  . Number of Children: 3  . Years of Education: MBA   Occupational History  . Retired    Social History Main Topics  . Smoking status: Former Smoker -- 0.50 packs/day for 6  years    Types: Cigarettes    Quit date: 01/15/1958  . Smokeless tobacco: Never Used  . Alcohol Use: No  . Drug Use: No  . Sexual Activity: Not on file   Other Topics Concern  . Not on file   Social History Narrative   Patient lives at home with spouse.   Caffeine use: none   3 children; 1 deceased     PHYSICAL EXAM  Filed Vitals:   01/20/15 1503  BP: 129/83  Pulse: 68  Weight: 156 lb (70.761 kg)   Wt Readings from Last 3 Encounters:  01/20/15 156 lb (70.761 kg)  12/12/14 158 lb (71.668 kg)  11/08/14 159 lb 3 oz (72.207 kg)   Body mass index is 27.64 kg/(m^2).  GENERAL EXAM: Patient is in no distress; well developed, nourished and groomed; neck  is supple; MILD HIGH ARCH AND HAMMERTOES IN FEET  CARDIOVASCULAR: Regular rate and rhythm, no murmurs, no carotid bruits  NEUROLOGIC: MENTAL STATUS: awake, alert, language fluent, comprehension intact, naming intact, fund of knowledge appropriate CRANIAL NERVE: pupils equal and reactive to light, visual fields full to confrontation, extraocular muscles intact, no nystagmus, facial sensation and strength symmetric, hearing intact, palate elevates symmetrically, uvula midline, shoulder shrug symmetric, tongue midline. MOTOR: normal bulk and tone, full strength in the BUE, BLE; EXCEPT DF WEAKNESS (3) IN BILATERAL FEET SENSORY: DECR PP, LT, TEMP IN FEET; ABSENT VIB AT KNEES, ANKLES AND TOES COORDINATION: finger-nose-finger, fine finger movements normal REFLEXES: deep tendon reflexes TRACE and symmetric GAIT/STATION: narrow based gait; SCUFFS FEET. SLOW UNSTEADY TURNING. CANNOT TOE, HEEL OR TANDEM; UNSTEADY ON ROMBERG    DIAGNOSTIC DATA (LABS, IMAGING, TESTING) - I reviewed patient records, labs, notes, testing and imaging myself where available.  Lab Results  Component Value Date   WBC 10.1 12/12/2014   HGB 13.5 12/12/2014   HCT 41.8 12/12/2014   MCV 92.9 12/12/2014   PLT 284 12/12/2014      Component Value Date/Time     NA 138 12/12/2014 1620   K 3.9 12/12/2014 1620   CL 104 12/12/2014 1620   CO2 27 12/12/2014 1620   GLUCOSE 130* 12/12/2014 1620   BUN 11 12/12/2014 1620   CREATININE 0.83 12/12/2014 1620   CALCIUM 9.2 12/12/2014 1620   PROT 6.1* 12/12/2014 1620   PROT 6.6 01/20/2014 1237   ALBUMIN 3.5 12/12/2014 1620   AST 105* 12/12/2014 1620   ALT 50 12/12/2014 1620   ALKPHOS 92 12/12/2014 1620   BILITOT 0.8 12/12/2014 1620   GFRNONAA >60 12/12/2014 1620   GFRAA >60 12/12/2014 1620   No results found for: CHOL, HDL, LDLCALC, LDLDIRECT, TRIG, CHOLHDL Lab Results  Component Value Date   HGBA1C 5.7* 01/20/2014   Lab Results  Component Value Date   VITAMINB12 >1999* 01/20/2014   Lab Results  Component Value Date   TSH 1.200 01/20/2014     I reviewed images myself and agree with interpretation. -VRP  01/02/14 CT head / maxillofacial 1. No acute intracranial pathology. 2. No acute osseous injury of the maxillofacial bones. 3. Mild generalized cerebral atrophy and chronic microvascular disease.  09/10/13 CTA head/neck 1. Negative for age CTA head and neck. 2. Stable CT appearance of the brain.  01/20/14 Neuropathy panel - negative  03/15/14 MRI lumbar spine demonstrating multilevel degenerative changes as detailed above. Specifically: 1. Moderate left foraminal narrowing at L1-L2 due to disc protrusion, endplate spurring and facet hypertrophy could lead to dynamic impingement of the left L1 nerve root  2. Moderately severe right lateral recess stenosis at L2-L3 due to disc protrusion, endplate spurring, mild facet hypertrophy leading to mild spinal stenosis. There could be right L3 nerve root compression at this level. 3. Moderately to severe fairly narrowed neural foramina and mild to moderate lateral recess stenosis at L3-L4 due to disc protrusion, endplate spurring and facet hypertrophy causing mild spinal stenosis. There could be impingement of either of the exiting L3 nerve root  with less potential for dynamic impingement of the L4 nerve roots. 4. Moderate bilateral foraminal narrowing at L4-L5 due to disc bulge and facet hypertrophy that could lead to dynamic impingement of the exiting L4 nerve roots   5. Severe right lateral recess stenosis and bilateral moderate foraminal narrowing at L5-S1 due to anterolisthesis, severe facet hypertrophy and right paramedian disc herniation. There appears to be compression  of the right S1 nerve root and there could also be dynamic impingement of the exiting L5 nerve roots.  03/19/14 EMG/NCS  1. Severe length dependent axonal sensorimotor polyneuropathy. 2. Bilateral lower lumbar radiculopathies.    ASSESSMENT AND PLAN  82 y.o. year old female with postconcussion syndrome (fall on 11/19/13) with resultant scalp numbness/sensitivity, facial pain. Also with suspected underlying peripheral neuropathy for past 5-6 years, without clear etiology (possible hereditary neuropathy). Then second fall on Feb 19, 2014, with right facial trauma (no fractures) and right forearm fracture.   EMG and MRI lumbar spine confirm combination of severe axonal neuropathy + lumbar polyradiculopathy. I do not think patient would be a good surgical candidate at this time due to combination of neuropathy + lumbar spine disease, lack of back pain, advanced age, and relatively good improvement with conservative therapy. PT evaluation and exercises have significantly improved her balance and walking.   Dx: idiopathic hereditary neuropathy + lumbar polyradiculopathy + post-traumatic headaches + anxiety/insomnia  PLAN: - continue PT exercises - caution with gait and balance - monitor symptoms; anxiety and insomnia issues reviewed  Return if symptoms worsen or fail to improve, for return to PCP.   Penni Bombard, MD [id number], 0000000 PM Certified in Neurology, Neurophysiology and Neuroimaging  [facility] [address], [place], [place] [address] [phone]

## 2015-01-20 NOTE — Patient Instructions (Signed)

## 2015-01-25 DIAGNOSIS — M25512 Pain in left shoulder: Secondary | ICD-10-CM | POA: Diagnosis not present

## 2015-01-31 ENCOUNTER — Other Ambulatory Visit (HOSPITAL_COMMUNITY): Payer: Commercial Managed Care - HMO

## 2015-01-31 ENCOUNTER — Encounter: Payer: Self-pay | Admitting: Internal Medicine

## 2015-02-01 DIAGNOSIS — M25512 Pain in left shoulder: Secondary | ICD-10-CM | POA: Diagnosis not present

## 2015-02-07 DIAGNOSIS — M25512 Pain in left shoulder: Secondary | ICD-10-CM | POA: Diagnosis not present

## 2015-02-09 DIAGNOSIS — L720 Epidermal cyst: Secondary | ICD-10-CM | POA: Diagnosis not present

## 2015-02-09 DIAGNOSIS — L900 Lichen sclerosus et atrophicus: Secondary | ICD-10-CM | POA: Diagnosis not present

## 2015-02-09 DIAGNOSIS — L814 Other melanin hyperpigmentation: Secondary | ICD-10-CM | POA: Diagnosis not present

## 2015-02-09 DIAGNOSIS — L570 Actinic keratosis: Secondary | ICD-10-CM | POA: Diagnosis not present

## 2015-02-09 DIAGNOSIS — L821 Other seborrheic keratosis: Secondary | ICD-10-CM | POA: Diagnosis not present

## 2015-02-09 DIAGNOSIS — D1801 Hemangioma of skin and subcutaneous tissue: Secondary | ICD-10-CM | POA: Diagnosis not present

## 2015-02-10 DIAGNOSIS — M25512 Pain in left shoulder: Secondary | ICD-10-CM | POA: Diagnosis not present

## 2015-02-15 DIAGNOSIS — M25512 Pain in left shoulder: Secondary | ICD-10-CM | POA: Diagnosis not present

## 2015-02-17 ENCOUNTER — Ambulatory Visit: Payer: Commercial Managed Care - HMO | Admitting: Internal Medicine

## 2015-02-24 ENCOUNTER — Other Ambulatory Visit (HOSPITAL_COMMUNITY): Payer: Commercial Managed Care - HMO

## 2015-02-25 ENCOUNTER — Other Ambulatory Visit: Payer: Self-pay

## 2015-02-25 ENCOUNTER — Encounter (HOSPITAL_COMMUNITY): Payer: Self-pay

## 2015-02-25 ENCOUNTER — Encounter (HOSPITAL_COMMUNITY)
Admission: RE | Admit: 2015-02-25 | Discharge: 2015-02-25 | Disposition: A | Discharge status: Home / Self Care | Payer: Commercial Managed Care - HMO | Source: Ambulatory Visit | Attending: General Surgery | Admitting: General Surgery

## 2015-02-25 DIAGNOSIS — Z7982 Long term (current) use of aspirin: Secondary | ICD-10-CM | POA: Insufficient documentation

## 2015-02-25 DIAGNOSIS — K219 Gastro-esophageal reflux disease without esophagitis: Secondary | ICD-10-CM | POA: Diagnosis not present

## 2015-02-25 DIAGNOSIS — E785 Hyperlipidemia, unspecified: Secondary | ICD-10-CM | POA: Insufficient documentation

## 2015-02-25 DIAGNOSIS — F329 Major depressive disorder, single episode, unspecified: Secondary | ICD-10-CM | POA: Diagnosis not present

## 2015-02-25 DIAGNOSIS — Z79899 Other long term (current) drug therapy: Secondary | ICD-10-CM | POA: Diagnosis not present

## 2015-02-25 DIAGNOSIS — Z87891 Personal history of nicotine dependence: Secondary | ICD-10-CM | POA: Diagnosis not present

## 2015-02-25 DIAGNOSIS — Z853 Personal history of malignant neoplasm of breast: Secondary | ICD-10-CM | POA: Diagnosis not present

## 2015-02-25 DIAGNOSIS — Z01812 Encounter for preprocedural laboratory examination: Secondary | ICD-10-CM | POA: Diagnosis not present

## 2015-02-25 DIAGNOSIS — F419 Anxiety disorder, unspecified: Secondary | ICD-10-CM | POA: Insufficient documentation

## 2015-02-25 DIAGNOSIS — Z1231 Encounter for screening mammogram for malignant neoplasm of breast: Secondary | ICD-10-CM

## 2015-02-25 DIAGNOSIS — Z01818 Encounter for other preprocedural examination: Secondary | ICD-10-CM | POA: Insufficient documentation

## 2015-02-25 DIAGNOSIS — K8020 Calculus of gallbladder without cholecystitis without obstruction: Secondary | ICD-10-CM | POA: Diagnosis not present

## 2015-02-25 DIAGNOSIS — Z96653 Presence of artificial knee joint, bilateral: Secondary | ICD-10-CM | POA: Diagnosis not present

## 2015-02-25 HISTORY — DX: Adverse effect of unspecified anesthetic, initial encounter: T41.45XA

## 2015-02-25 HISTORY — DX: Other complications of anesthesia, initial encounter: T88.59XA

## 2015-02-25 LAB — COMPREHENSIVE METABOLIC PANEL
ALBUMIN: 3.5 g/dL (ref 3.5–5.0)
ALT: 18 U/L (ref 14–54)
ANION GAP: 10 (ref 5–15)
AST: 23 U/L (ref 15–41)
Alkaline Phosphatase: 60 U/L (ref 38–126)
BILIRUBIN TOTAL: 0.8 mg/dL (ref 0.3–1.2)
BUN: 17 mg/dL (ref 6–20)
CHLORIDE: 103 mmol/L (ref 101–111)
CO2: 28 mmol/L (ref 22–32)
Calcium: 9.5 mg/dL (ref 8.9–10.3)
Creatinine, Ser: 0.75 mg/dL (ref 0.44–1.00)
GFR calc Af Amer: >60 mL/min (ref >60)
GFR calc non Af Amer: >60 mL/min (ref >60)
GLUCOSE: 102 mg/dL — AB (ref 65–99)
POTASSIUM: 4.4 mmol/L (ref 3.5–5.1)
SODIUM: 141 mmol/L (ref 135–145)
TOTAL PROTEIN: 6.4 g/dL — AB (ref 6.5–8.1)

## 2015-02-25 LAB — CBC
HEMATOCRIT: 43.2 % (ref 36.0–46.0)
Hemoglobin: 14.4 g/dL (ref 12.0–15.0)
MCH: 29.8 pg (ref 26.0–34.0)
MCHC: 33.3 g/dL (ref 30.0–36.0)
MCV: 89.4 fL (ref 78.0–100.0)
Platelets: 284 10*3/uL (ref 150–400)
RBC: 4.83 MIL/uL (ref 3.87–5.11)
RDW: 13.8 % (ref 11.5–15.5)
WBC: 5.7 10*3/uL (ref 4.0–10.5)

## 2015-02-25 NOTE — Progress Notes (Signed)
Anesthesia PAT Evaluation: Patient is a 82 year old female scheduled for laparoscopic cholecystectomy on 03/04/15 by Dr. [name].   PMH includes: hyperlipidemia, left breast cancer s/p mastectomy, GERD, anxiety, depression, concussion 11/2013, former smoker, bilateral TKA, nasal sinus surgery, left reverse total shoulder '16.  BMI 27. PCP is Dr. Prince Solian.   For anesthesia history, she reported MEMORY DELCINE following multiple surgeries but worse after her left reverse shoulder arthroplasty on 11/11/14. In the past, she has noticed more short term memory decline following surgery. With her 11/11/14 she does not feel that she has yet to completely recover from her memory decline. She seems more forgetful, ie may not remember something that was told to her a few days ago. They have several physician friends who suggested they make sure her anesthesia team was aware of her post-operative cognitive decline history so her anesthesia could be titrated accordingly. I discussed with her and her husband Abbe Amsterdam risk factors for post-operative cognitive decline. While I can not promise her that she will not experience further postoperative cognitive decline, we did discuss associated risk factors and measures that her anesthesia team can take to help to decrease risk (ie, BIS monitoring, avoiding or minimizing certain agents). Her anesthesiologist can also review her last anesthesia records and adjust his/her anesthesia plan as felt indicated. She reports she is off narcotic pain medication and no longer drinks alcohol due to it causing her headaches. She is on several medications that could also affect her cognitive function. I attempted to get a sense if she has been having signs/symptoms of cognitive decline even before her 11/11/14 surgery, but couldn't get a direct answer. It seems she thinks it is mostly related to surgery/anesthesia. She has had more frequent falls since 2015 with a concussion in 11/2013 and  apparently lost a son in 08/2013. She is seeing a neurologist Dr. Leta Baptist for headaches and post-concussion syndrome (from 11/2013 fall) which I thought is also a risk factor.   Meds include aspirin 81 mg, Nexium, Allegra, Flonase, Neurontin, Ativan, nortriptyline, fish oil, Evista, tramadol, Ambien.  EKG 11/08/2014: NSR. Septal infarct, age undetermined. No significant change since 11/19/13 per Dr. Evette Georges interpretation.   Echo 01/22/14: - Left ventricle: The cavity size was normal. Wall thickness was normal. Systolic function was normal. The estimated ejection fraction was in the range of 55% to 60%. - Aortic valve: There was mild stenosis. There was trivial regurgitation. - Mitral valve: Calcified annulus. Mildly thickened leaflets. - Atrial septum: No defect or patent foramen ovale was identified.  01/02/14 CT head w/o contrast: IMPRESSION: 1. No acute intracranial pathology. 2. No acute osseous injury of the maxillofacial bones. 3. Mild generalized cerebral atrophy and chronic microvascular Disease.  09/10/13 CTA head/neck: IMPRESSION: 1. Negative for age CTA head and neck. 2. Stable CT appearance of the brain.  Preoperative labs noted.  If no acute changes then I anticipate that she can proceed as planned. As above, she just wanted to make sure her anesthesia team knew about her post-operative cognitive decline history soo they could use this to help formulate the best anesthetic plan/agents. She and her husband were comfortable talking further with her anesthesiologist on the day of surgery.   George Hugh [facility]/Anesthesiology Phone [phone] 02/25/2015 7:23 PM

## 2015-02-25 NOTE — Pre-Procedure Instructions (Signed)
    [name]  02/25/2015      [facility] [place], [address] [place] [address] Phone: [phone] Fax: [phone]  [facility], [address] [place] [address] Phone: [phone] Fax: [phone]    Your procedure is scheduled on Friday, Feb. 17  Report to [facility] at 5:30 A.M.  Call this number if you have problems the morning of surgery:  [phone]              Any questions prior to surgery  call [phone] Monday-Friday 8am-4pm   Remember:  Do not eat food or drink liquids after midnight on Thursday, Feb. 16  Take these medicines the morning of surgery with A SIP OF WATER : tylenol if needed, allegra, flonase nasal spray if needed, gabapentin (neurontin), lorazepam (ativan) if needed, eviata(raloxifene), tramadol if needed             Stop aspirin, NSAIDS: advil, ibuprofen, motrin,aleve, BC'S,Goody's, fish oil, herbal medicines and vitamins 5 days prior to surgery.   Do not wear jewelry, make-up or nail polish.  Do not wear lotions, powders, or perfumes.  You may not wear deodorant.  Do not shave 48 hours prior to surgery.  Men may shave face and neck.  Do not bring valuables to the hospital.  [facility] is not responsible for any belongings or valuables.  Contacts, dentures or bridgework may not be worn into surgery.  Leave your suitcase in the car.  After surgery it may be brought to your room.  For patients admitted to the hospital, discharge time will be determined by your treatment team.  Patients discharged the day of surgery will not be allowed to drive home.   Name and phone number of your driver:   Special instructions:  Review handouts  Please read over the following fact sheets that you were given. Pain Booklet, Coughing and Deep Breathing and Surgical Site Infection  Prevention

## 2015-02-25 NOTE — Progress Notes (Signed)
PCP: Dr. Dagmar Hait Pt. Stated she saw Dr. Caryl Comes yrs ago for incresae heart rate. Was never put on medication and did not have to follow back up with Dr. Caryl Comes. Pt. Requesting to speak to anesthesia. Had a shoulder surgery 10/1014 and has experienced problems with memory. recall.

## 2015-03-02 DIAGNOSIS — M19011 Primary osteoarthritis, right shoulder: Secondary | ICD-10-CM | POA: Diagnosis not present

## 2015-03-02 DIAGNOSIS — Z96612 Presence of left artificial shoulder joint: Secondary | ICD-10-CM | POA: Diagnosis not present

## 2015-03-02 DIAGNOSIS — Z471 Aftercare following joint replacement surgery: Secondary | ICD-10-CM | POA: Diagnosis not present

## 2015-03-03 MED ORDER — CEFOTETAN DISODIUM-DEXTROSE 2-2.08 GM-% IV SOLR
2.0000 g | INTRAVENOUS | Status: AC
  Administered 2015-03-04: 2 g via INTRAVENOUS
  Filled 2015-03-03: qty 50

## 2015-03-03 MED ORDER — CHLORHEXIDINE GLUCONATE 4 % EX LIQD: 1.0000 "application " | Freq: Once | CUTANEOUS | Status: DC

## 2015-03-04 ENCOUNTER — Encounter (HOSPITAL_COMMUNITY): Payer: Self-pay | Admitting: *Deleted

## 2015-03-04 ENCOUNTER — Ambulatory Visit (HOSPITAL_COMMUNITY)
Admission: RE | Admit: 2015-03-04 | Discharge: 2015-03-04 | Disposition: A | Discharge status: Home / Self Care | Payer: Commercial Managed Care - HMO | Source: Ambulatory Visit | Attending: General Surgery | Admitting: General Surgery

## 2015-03-04 ENCOUNTER — Ambulatory Visit (HOSPITAL_COMMUNITY): Payer: Commercial Managed Care - HMO | Admitting: Anesthesiology

## 2015-03-04 ENCOUNTER — Ambulatory Visit (HOSPITAL_COMMUNITY): Payer: Commercial Managed Care - HMO

## 2015-03-04 ENCOUNTER — Ambulatory Visit (HOSPITAL_COMMUNITY): Payer: Commercial Managed Care - HMO | Admitting: Vascular Surgery

## 2015-03-04 ENCOUNTER — Encounter (HOSPITAL_COMMUNITY)
Admission: RE | Disposition: A | Discharge status: Home / Self Care | Payer: Self-pay | Source: Ambulatory Visit | Attending: General Surgery

## 2015-03-04 DIAGNOSIS — M1990 Unspecified osteoarthritis, unspecified site: Secondary | ICD-10-CM | POA: Insufficient documentation

## 2015-03-04 DIAGNOSIS — K8020 Calculus of gallbladder without cholecystitis without obstruction: Secondary | ICD-10-CM

## 2015-03-04 DIAGNOSIS — K219 Gastro-esophageal reflux disease without esophagitis: Secondary | ICD-10-CM | POA: Insufficient documentation

## 2015-03-04 DIAGNOSIS — Z96653 Presence of artificial knee joint, bilateral: Secondary | ICD-10-CM | POA: Insufficient documentation

## 2015-03-04 DIAGNOSIS — Z87891 Personal history of nicotine dependence: Secondary | ICD-10-CM | POA: Diagnosis not present

## 2015-03-04 DIAGNOSIS — Z96612 Presence of left artificial shoulder joint: Secondary | ICD-10-CM | POA: Insufficient documentation

## 2015-03-04 DIAGNOSIS — E785 Hyperlipidemia, unspecified: Secondary | ICD-10-CM | POA: Diagnosis not present

## 2015-03-04 DIAGNOSIS — Z853 Personal history of malignant neoplasm of breast: Secondary | ICD-10-CM | POA: Diagnosis not present

## 2015-03-04 DIAGNOSIS — K8010 Calculus of gallbladder with chronic cholecystitis without obstruction: Secondary | ICD-10-CM | POA: Insufficient documentation

## 2015-03-04 DIAGNOSIS — Z7951 Long term (current) use of inhaled steroids: Secondary | ICD-10-CM | POA: Diagnosis not present

## 2015-03-04 DIAGNOSIS — Z79818 Long term (current) use of other agents affecting estrogen receptors and estrogen levels: Secondary | ICD-10-CM | POA: Insufficient documentation

## 2015-03-04 HISTORY — PX: CHOLECYSTECTOMY: SHX55

## 2015-03-04 SURGERY — LAPAROSCOPIC CHOLECYSTECTOMY WITH INTRAOPERATIVE CHOLANGIOGRAM
Anesthesia: General | Site: Abdomen

## 2015-03-04 MED ORDER — ROCURONIUM BROMIDE 100 MG/10ML IV SOLN
INTRAVENOUS | Status: DC | PRN
  Administered 2015-03-04: 40 mg via INTRAVENOUS

## 2015-03-04 MED ORDER — ROCURONIUM BROMIDE 50 MG/5ML IV SOLN
INTRAVENOUS | Status: AC
  Filled 2015-03-04: qty 1

## 2015-03-04 MED ORDER — SODIUM CHLORIDE 0.9 % IV SOLN
INTRAVENOUS | Status: DC | PRN
  Administered 2015-03-04: 19 mL

## 2015-03-04 MED ORDER — OXYCODONE HCL 5 MG/5ML PO SOLN: 5.0000 mg | Freq: Once | ORAL | Status: DC | PRN

## 2015-03-04 MED ORDER — OXYCODONE HCL 5 MG PO TABS: 5.0000 mg | ORAL_TABLET | ORAL | Status: DC | PRN

## 2015-03-04 MED ORDER — SODIUM CHLORIDE 0.9 % IR SOLN
Status: DC | PRN
  Administered 2015-03-04: 1000 mL

## 2015-03-04 MED ORDER — LIDOCAINE HCL (CARDIAC) 20 MG/ML IV SOLN
INTRAVENOUS | Status: AC
  Filled 2015-03-04: qty 5

## 2015-03-04 MED ORDER — FENTANYL CITRATE (PF) 250 MCG/5ML IJ SOLN
INTRAMUSCULAR | Status: AC
  Filled 2015-03-04: qty 5

## 2015-03-04 MED ORDER — GLYCOPYRROLATE 0.2 MG/ML IJ SOLN
INTRAMUSCULAR | Status: DC | PRN
  Administered 2015-03-04: 0.4 mg via INTRAVENOUS

## 2015-03-04 MED ORDER — FENTANYL CITRATE (PF) 100 MCG/2ML IJ SOLN
25.0000 ug | INTRAMUSCULAR | Status: DC | PRN
  Administered 2015-03-04: 50 ug via INTRAVENOUS

## 2015-03-04 MED ORDER — BUPIVACAINE-EPINEPHRINE 0.25% -1:200000 IJ SOLN
INTRAMUSCULAR | Status: DC | PRN
  Administered 2015-03-04: 22 mL

## 2015-03-04 MED ORDER — SODIUM CHLORIDE 0.9% FLUSH: 3.0000 mL | Freq: Two times a day (BID) | INTRAVENOUS | Status: DC

## 2015-03-04 MED ORDER — SODIUM CHLORIDE 0.9 % IV SOLN: INTRAVENOUS | Status: DC

## 2015-03-04 MED ORDER — PROPOFOL 10 MG/ML IV BOLUS
INTRAVENOUS | Status: DC | PRN
  Administered 2015-03-04: 20 mg via INTRAVENOUS
  Administered 2015-03-04: 50 mg via INTRAVENOUS
  Administered 2015-03-04 (×2): 20 mg via INTRAVENOUS

## 2015-03-04 MED ORDER — SODIUM CHLORIDE 0.9 % IV SOLN: 250.0000 mL | INTRAVENOUS | Status: DC | PRN

## 2015-03-04 MED ORDER — ACETAMINOPHEN 325 MG PO TABS: 650.0000 mg | ORAL_TABLET | ORAL | Status: DC | PRN

## 2015-03-04 MED ORDER — ACETAMINOPHEN 650 MG RE SUPP: 650.0000 mg | RECTAL | Status: DC | PRN

## 2015-03-04 MED ORDER — ACETAMINOPHEN 325 MG PO TABS: 325.0000 mg | ORAL_TABLET | ORAL | Status: DC | PRN

## 2015-03-04 MED ORDER — OXYCODONE HCL 5 MG PO TABS
5.0000 mg | ORAL_TABLET | ORAL | Status: DC | PRN
  Administered 2015-03-04: 10 mg via ORAL

## 2015-03-04 MED ORDER — PROPOFOL 10 MG/ML IV BOLUS
INTRAVENOUS | Status: AC
  Filled 2015-03-04: qty 20

## 2015-03-04 MED ORDER — ACETAMINOPHEN 10 MG/ML IV SOLN
INTRAVENOUS | Status: DC | PRN
  Administered 2015-03-04: 1000 mg via INTRAVENOUS

## 2015-03-04 MED ORDER — FENTANYL CITRATE (PF) 100 MCG/2ML IJ SOLN: 25.0000 ug | INTRAMUSCULAR | Status: DC | PRN

## 2015-03-04 MED ORDER — ACETAMINOPHEN 10 MG/ML IV SOLN
INTRAVENOUS | Status: AC
  Filled 2015-03-04: qty 100

## 2015-03-04 MED ORDER — OXYCODONE HCL 5 MG PO TABS: 5.0000 mg | ORAL_TABLET | Freq: Once | ORAL | Status: DC | PRN

## 2015-03-04 MED ORDER — PROPOFOL 500 MG/50ML IV EMUL
INTRAVENOUS | Status: DC | PRN
Start: 2015-03-04 — End: 2015-03-04
  Administered 2015-03-04: 125 ug/kg/min via INTRAVENOUS

## 2015-03-04 MED ORDER — FENTANYL CITRATE (PF) 100 MCG/2ML IJ SOLN
INTRAMUSCULAR | Status: DC | PRN
  Administered 2015-03-04 (×2): 50 ug via INTRAVENOUS

## 2015-03-04 MED ORDER — NEOSTIGMINE METHYLSULFATE 10 MG/10ML IV SOLN
INTRAVENOUS | Status: DC | PRN
  Administered 2015-03-04: 3 mg via INTRAVENOUS

## 2015-03-04 MED ORDER — ONDANSETRON HCL 4 MG/2ML IJ SOLN
INTRAMUSCULAR | Status: DC | PRN
  Administered 2015-03-04: 4 mg via INTRAVENOUS

## 2015-03-04 MED ORDER — LIDOCAINE HCL (CARDIAC) 20 MG/ML IV SOLN
INTRAVENOUS | Status: DC | PRN
  Administered 2015-03-04: 40 mg via INTRAVENOUS

## 2015-03-04 MED ORDER — KETOROLAC TROMETHAMINE 15 MG/ML IJ SOLN
INTRAMUSCULAR | Status: DC | PRN
  Administered 2015-03-04: 15 mg via INTRAVENOUS

## 2015-03-04 MED ORDER — ACETAMINOPHEN 160 MG/5ML PO SOLN
325.0000 mg | ORAL | Status: DC | PRN
  Filled 2015-03-04: qty 20.3

## 2015-03-04 MED ORDER — SODIUM CHLORIDE 0.9% FLUSH: 3.0000 mL | INTRAVENOUS | Status: DC | PRN

## 2015-03-04 MED ORDER — 0.9 % SODIUM CHLORIDE (POUR BTL) OPTIME
TOPICAL | Status: DC | PRN
  Administered 2015-03-04: 1000 mL

## 2015-03-04 MED ORDER — OXYCODONE HCL 5 MG PO TABS
ORAL_TABLET | ORAL | Status: AC
  Filled 2015-03-04: qty 2

## 2015-03-04 MED ORDER — FENTANYL CITRATE (PF) 100 MCG/2ML IJ SOLN
INTRAMUSCULAR | Status: AC
  Filled 2015-03-04: qty 2

## 2015-03-04 MED ORDER — LACTATED RINGERS IV SOLN
INTRAVENOUS | Status: DC | PRN
  Administered 2015-03-04: 08:00:00 via INTRAVENOUS

## 2015-03-04 MED ORDER — BUPIVACAINE-EPINEPHRINE (PF) 0.25% -1:200000 IJ SOLN
INTRAMUSCULAR | Status: AC
  Filled 2015-03-04: qty 30

## 2015-03-04 SURGICAL SUPPLY — 53 items
APPLIER CLIP 5 13 M/L LIGAMAX5 (MISCELLANEOUS) ×3
BANDAGE ADH SHEER 1  50/CT (GAUZE/BANDAGES/DRESSINGS) IMPLANT
BENZOIN TINCTURE PRP APPL 2/3 (GAUZE/BANDAGES/DRESSINGS) IMPLANT
BLADE SURG ROTATE 9660 (MISCELLANEOUS) IMPLANT
CANISTER SUCTION 2500CC (MISCELLANEOUS) ×3 IMPLANT
CHLORAPREP W/TINT 26ML (MISCELLANEOUS) ×3 IMPLANT
CLIP APPLIE 5 13 M/L LIGAMAX5 (MISCELLANEOUS) ×1 IMPLANT
CLOSURE WOUND 1/2 X4 (GAUZE/BANDAGES/DRESSINGS)
COVER MAYO STAND STRL (DRAPES) ×3 IMPLANT
COVER SURGICAL LIGHT HANDLE (MISCELLANEOUS) ×3 IMPLANT
DERMABOND ADVANCED (GAUZE/BANDAGES/DRESSINGS) ×2
DERMABOND ADVANCED .7 DNX12 (GAUZE/BANDAGES/DRESSINGS) ×1 IMPLANT
DRAPE C-ARM 42X72 X-RAY (DRAPES) ×3 IMPLANT
DRSG TEGADERM 4X4.75 (GAUZE/BANDAGES/DRESSINGS) IMPLANT
ELECT REM PT RETURN 9FT ADLT (ELECTROSURGICAL) ×3
ELECTRODE REM PT RTRN 9FT ADLT (ELECTROSURGICAL) ×1 IMPLANT
GAUZE SPONGE 2X2 8PLY STRL LF (GAUZE/BANDAGES/DRESSINGS) IMPLANT
GLOVE BIO SURGEON STRL SZ7 (GLOVE) ×3 IMPLANT
GLOVE BIOGEL M STRL SZ7.5 (GLOVE) ×3 IMPLANT
GLOVE BIOGEL PI IND STRL 6.5 (GLOVE) ×1 IMPLANT
GLOVE BIOGEL PI IND STRL 7.0 (GLOVE) ×1 IMPLANT
GLOVE BIOGEL PI IND STRL 7.5 (GLOVE) ×1 IMPLANT
GLOVE BIOGEL PI IND STRL 8 (GLOVE) ×2 IMPLANT
GLOVE BIOGEL PI INDICATOR 6.5 (GLOVE) ×2
GLOVE BIOGEL PI INDICATOR 7.0 (GLOVE) ×2
GLOVE BIOGEL PI INDICATOR 7.5 (GLOVE) ×2
GLOVE BIOGEL PI INDICATOR 8 (GLOVE) ×4
GLOVE ECLIPSE 7.5 STRL STRAW (GLOVE) ×3 IMPLANT
GLOVE SURG SS PI 7.5 STRL IVOR (GLOVE) ×3 IMPLANT
GOWN STRL REUS W/ TWL LRG LVL3 (GOWN DISPOSABLE) ×3 IMPLANT
GOWN STRL REUS W/ TWL XL LVL3 (GOWN DISPOSABLE) ×1 IMPLANT
GOWN STRL REUS W/TWL LRG LVL3 (GOWN DISPOSABLE) ×6
GOWN STRL REUS W/TWL XL LVL3 (GOWN DISPOSABLE) ×2
KIT BASIN OR (CUSTOM PROCEDURE TRAY) ×3 IMPLANT
KIT ROOM TURNOVER OR (KITS) ×3 IMPLANT
NS IRRIG 1000ML POUR BTL (IV SOLUTION) ×3 IMPLANT
PAD ARMBOARD 7.5X6 YLW CONV (MISCELLANEOUS) ×3 IMPLANT
POUCH RETRIEVAL ECOSAC 10 (ENDOMECHANICALS) ×1 IMPLANT
POUCH RETRIEVAL ECOSAC 10MM (ENDOMECHANICALS) ×2
SCISSORS LAP 5X35 DISP (ENDOMECHANICALS) ×3 IMPLANT
SET CHOLANGIOGRAPH 5 50 .035 (SET/KITS/TRAYS/PACK) ×3 IMPLANT
SET IRRIG TUBING LAPAROSCOPIC (IRRIGATION / IRRIGATOR) ×3 IMPLANT
SLEEVE ENDOPATH XCEL 5M (ENDOMECHANICALS) ×6 IMPLANT
SPECIMEN JAR SMALL (MISCELLANEOUS) ×3 IMPLANT
SPONGE GAUZE 2X2 STER 10/PKG (GAUZE/BANDAGES/DRESSINGS)
STRIP CLOSURE SKIN 1/2X4 (GAUZE/BANDAGES/DRESSINGS) IMPLANT
SUT MNCRL AB 4-0 PS2 18 (SUTURE) ×3 IMPLANT
TOWEL OR 17X24 6PK STRL BLUE (TOWEL DISPOSABLE) IMPLANT
TOWEL OR 17X26 10 PK STRL BLUE (TOWEL DISPOSABLE) ×3 IMPLANT
TRAY LAPAROSCOPIC MC (CUSTOM PROCEDURE TRAY) ×3 IMPLANT
TROCAR XCEL BLUNT TIP 100MML (ENDOMECHANICALS) ×3 IMPLANT
TROCAR XCEL NON-BLD 5MMX100MML (ENDOMECHANICALS) ×3 IMPLANT
TUBING INSUFFLATION (TUBING) ×3 IMPLANT

## 2015-03-04 NOTE — Discharge Instructions (Signed)
[facility] °LAPAROSCOPIC SURGERY: POST OP INSTRUCTIONS °Always review your discharge instruction sheet given to you by the facility where your surgery was performed. °IF YOU HAVE DISABILITY OR FAMILY LEAVE FORMS, YOU MUST BRING THEM TO THE OFFICE FOR PROCESSING.   °DO NOT GIVE THEM TO YOUR DOCTOR. ° °1. A prescription for pain medication may be given to you upon discharge.  Take your pain medication as prescribed, if needed.  If narcotic pain medicine is not needed, then you may take acetaminophen (Tylenol) or ibuprofen (Advil) as needed. °2. Take your usually prescribed medications unless otherwise directed. °3. If you need a refill on your pain medication, please contact your pharmacy.  They will contact our office to request authorization. Prescriptions will not be filled after 5pm or on week-ends. °4. You should follow a light diet the first few days after arrival home, such as soup and crackers, etc.  Be sure to include lots of fluids daily. °5. Most patients will experience some swelling and bruising in the area of the incisions.  Ice packs will help.  Swelling and bruising can take several days to resolve.  °6. It is common to experience some constipation if taking pain medication after surgery.  Increasing fluid intake and taking a stool softener (such as Colace) will usually help or prevent this problem from occurring.  A mild laxative (Milk of Magnesia or Miralax) should be taken according to package instructions if there are no bowel movements after 48 hours. °7. If your surgeon used skin glue on the incision, you may shower in 24 hours.  The glue will flake off over the next 2-3 weeks.  Any sutures or staples will be removed at the office during your follow-up visit. °8. ACTIVITIES:  You may resume regular (light) daily activities beginning the next day--such as daily self-care, walking, climbing stairs--gradually increasing activities as tolerated.  You may have sexual  intercourse when it is comfortable.  Refrain from any heavy lifting or straining until approved by your doctor. °a. You may drive when you are no longer taking prescription pain medication, you can comfortably wear a seatbelt, and you can safely maneuver your car and apply brakes. °9. You should see your doctor in the office for a follow-up appointment approximately 2-3 weeks after your surgery.  Make sure that you call for this appointment within a day or two after you arrive home to insure a convenient appointment time. °10. OTHER INSTRUCTIONS:  °WHEN TO CALL YOUR DOCTOR: °1. Fever over 101.0 °2. Inability to urinate °3. Continued bleeding from incision. °4. Increased pain, redness, or drainage from the incision. °5. Increasing abdominal pain ° °The clinic staff is available to answer your questions during regular business hours.  Please don’t hesitate to call and ask to speak to one of the nurses for clinical concerns.  If you have a medical emergency, go to the nearest emergency room or call 911.  A surgeon from [facility] is always on call at the hospital. °[address], [address], [place], [place]  [address] ? [address] [address], [place], [place]   [address] °[phone] ? [phone] ? FAX [phone] °Web site: [email or web address] ° °

## 2015-03-04 NOTE — Anesthesia Preprocedure Evaluation (Signed)
Anesthesia Evaluation  Patient identified by MRN, date of birth, ID band Patient awake    Reviewed: Allergy & Precautions, NPO status , Patient's Chart, lab work & pertinent test results  History of Anesthesia Complications Negative for: history of anesthetic complications  Airway Mallampati: II  TM Distance: >3 FB Neck ROM: Full    Dental  (+) Teeth Intact   Pulmonary neg shortness of breath, neg sleep apnea, neg COPD, neg recent URI, former smoker,    breath sounds clear to auscultation       Cardiovascular negative cardio ROS   Rhythm:Regular     Neuro/Psych  Headaches, neg Seizures PSYCHIATRIC DISORDERS Anxiety Depression    GI/Hepatic Neg liver ROS, PUD, GERD  Medicated and Controlled,Gallstones   Endo/Other  negative endocrine ROS  Renal/GU negative Renal ROS     Musculoskeletal  (+) Arthritis ,   Abdominal   Peds  Hematology negative hematology ROS (+)   Anesthesia Other Findings   Reproductive/Obstetrics                             Anesthesia Physical Anesthesia Plan  ASA: II  Anesthesia Plan: General   Post-op Pain Management:    Induction: Intravenous  Airway Management Planned: Oral ETT  Additional Equipment: None  Intra-op Plan:   Post-operative Plan: Extubation in OR  Informed Consent: I have reviewed the patients History and Physical, chart, labs and discussed the procedure including the risks, benefits and alternatives for the proposed anesthesia with the patient or authorized representative who has indicated his/her understanding and acceptance.   Dental advisory given  Plan Discussed with: CRNA and Surgeon  Anesthesia Plan Comments:         Anesthesia Quick Evaluation

## 2015-03-04 NOTE — Transfer of Care (Signed)
Immediate Anesthesia Transfer of Care Note  Patient: [name]  Procedure(s) Performed: Procedure(s): LAPAROSCOPIC CHOLECYSTECTOMY WITH INTRAOPERATIVE CHOLANGIOGRAM (N/A)  Patient Location: PACU  Anesthesia Type:General  Level of Consciousness: awake  Airway & Oxygen Therapy: Patient Spontanous Breathing and Patient connected to nasal cannula oxygen  Post-op Assessment: Report given to RN and Post -op Vital signs reviewed and stable  Post vital signs: Reviewed and stable  Last Vitals:  Filed Vitals:   03/04/15 0643  BP: 132/63  Pulse: 70  Temp: 36.4 C  Resp: 20    Complications: No apparent anesthesia complications

## 2015-03-04 NOTE — H&P (Signed)
[name] is an 82 y.o. female.   Chief Complaint: here for surgery HPI: 82 yo female presented to office in 12/2014 with 1-2 episodes of postprandial RUQ pain. Workup revealed symptomatic cholelithiasis.   Past Medical History  Diagnosis Date  . Diverticulosis   . Allergic rhinitis   . Hyperlipidemia   . Headache(784.0)     sinus HAs  . Breast cancer ([place])     left breast cancer  . Arthritis   . Concussion   . Anxiety   . Depression   . GERD (gastroesophageal reflux disease)   . Complication of anesthesia     memory loss    Past Surgical History  Procedure Laterality Date  . Total knee arthroplasty      right  . Nasal sinus surgery      2004  . Replacement total knee      left  . Abdominal hysterectomy    . Mastectomy      left  . Shoulder surgery Bilateral     repairs, rot cuff repair  . Esophagogastroduodenoscopy N/A 08/15/2012    Procedure: ESOPHAGOGASTRODUODENOSCOPY (EGD);  Surgeon: Inda Castle, MD;  Location: [facility];  Service: Endoscopy;  Laterality: N/A;  . Reverse shoulder arthroplasty Left 11/11/2014    Procedure: LEFT REVERSE SHOULDER ARTHROPLASTY;  Surgeon: Justice Britain, MD;  Location: [place];  Service: Orthopedics;  Laterality: Left;    Family History  Problem Relation Age of Onset  . Colon cancer Father 56  . Breast cancer Mother   . Pancreatic cancer Sister   . Leukemia Son    Social History:  reports that she quit smoking about 57 years ago. Her smoking use included Cigarettes. She has a 3 pack-year smoking history. She has never used smokeless tobacco. She reports that she does not drink alcohol or use illicit drugs.  Allergies:  Allergies  Allergen Reactions  . Dust Mite Extract Other (See Comments)    Sinus drainage  . Other Other (See Comments)    Smoke, Cat Dander cause sinus drainage    Medications Prior to Admission  Medication Sig Dispense Refill  . acetaminophen (TYLENOL) 325 MG tablet Take 650 mg by mouth every 6  (six) hours as needed for mild pain.    Marland Kitchen aspirin EC 81 MG tablet Take 81 mg by mouth at bedtime.    . Calcium Carb-Cholecalciferol (CALCIUM 600 + D PO) Take 600 mg by mouth 2 (two) times daily.    . Cholecalciferol 2000 UNITS TABS Take 2,000 Units by mouth at bedtime.    Marland Kitchen esomeprazole (NEXIUM) 20 MG capsule Take 20 mg by mouth every evening.    . fexofenadine (ALLEGRA) 180 MG tablet Take 180 mg by mouth every other day.     . fluticasone (FLONASE) 50 MCG/ACT nasal spray Place 1 spray into both nostrils daily as needed for allergies or rhinitis.    Marland Kitchen gabapentin (NEURONTIN) 100 MG capsule Take 100 mg by mouth 3 (three) times daily.  6  . LORazepam (ATIVAN) 0.5 MG tablet Take 0.5 mg by mouth every 8 (eight) hours as needed for anxiety.    . Multiple Vitamin (MULTIVITAMIN WITH MINERALS) TABS tablet Take 1 tablet by mouth daily.    . nortriptyline (PAMELOR) 25 MG capsule Take 25 mg by mouth at bedtime.    . Omega-3 Fatty Acids (FISH OIL) 1200 MG CAPS Take 1,200 mg by mouth 2 (two) times daily.    . raloxifene (EVISTA) 60 MG tablet Take 60 mg  by mouth daily.    . traMADol (ULTRAM) 50 MG tablet Take 50 mg by mouth every 12 (twelve) hours as needed for moderate pain. As needed  4  . vitamin B-12 (CYANOCOBALAMIN) 1000 MCG tablet Take 1,000 mcg by mouth daily.    Marland Kitchen zolpidem (AMBIEN) 10 MG tablet Take 3.33 mg by mouth at bedtime as needed for sleep. 1/3 tablet      No results found for this or any previous visit (from the past 48 hour(s)). No results found.  Review of Systems  Constitutional: Negative for weight loss.  HENT: Negative for nosebleeds.   Eyes: Negative for blurred vision.  Respiratory: Negative for shortness of breath.   Cardiovascular: Negative for chest pain, palpitations, orthopnea and PND.       Denies DOE  Genitourinary: Negative for dysuria and hematuria.  Musculoskeletal: Negative.   Skin: Negative for itching and rash.  Neurological: Negative for dizziness, focal weakness,  seizures, loss of consciousness and headaches.       Denies TIAs, amaurosis fugax  Endo/Heme/Allergies: Does not bruise/bleed easily.  Psychiatric/Behavioral: The patient is not nervous/anxious.     Blood pressure 132/63, pulse 70, temperature 97.5 F (36.4 C), temperature source Oral, resp. rate 20, weight 70.308 kg (155 lb), SpO2 98 %. Physical Exam  Vitals reviewed. Constitutional: She is oriented to person, place, and time. She appears well-developed and well-nourished. No distress.  HENT:  Head: Normocephalic and atraumatic.  Right Ear: External ear normal.  Left Ear: External ear normal.  Eyes: Conjunctivae are normal. No scleral icterus.  Neck: Normal range of motion. Neck supple. No tracheal deviation present. No thyromegaly present.  Cardiovascular: Normal rate and normal heart sounds.   Respiratory: Effort normal and breath sounds normal. No stridor. No respiratory distress. She has no wheezes.  GI: Soft. She exhibits no distension. There is no tenderness.  Musculoskeletal: She exhibits no edema or tenderness.  Lymphadenopathy:    She has no cervical adenopathy.  Neurological: She is alert and oriented to person, place, and time. She exhibits normal muscle tone.  Skin: Skin is warm and dry. No rash noted. She is not diaphoretic. No erythema. No pallor.  Psychiatric: She has a normal mood and affect. Her behavior is normal. Judgment and thought content normal.     Assessment/Plan Symptomatic cholelithiasis  To OR for Lap chole with IOC IV abx on call scds All questions asked and answered.   [name]. Melanie Pulling, MD, [place], Bariatric, & Minimally Invasive Surgery [facility], [place]   Melanie Curry, MD 03/04/2015, 7:24 AM

## 2015-03-04 NOTE — Op Note (Signed)
KARESS MAXA [id number] 05-04-1929 03/04/2015  Laparoscopic Cholecystectomy with IOC Procedure Note  Indications: This patient presents with symptomatic gallbladder disease and will undergo laparoscopic cholecystectomy.  Pre-operative Diagnosis: symptomatic cholelithiasis  Post-operative Diagnosis: Calculus of gallbladder with other cholecystitis, without mention of obstruction  Surgeon: Gayland Curry   Assistants: Judyann Munson, RNFA  Anesthesia: General endotracheal anesthesia  Procedure Details  The patient was seen again in the Holding Room. The risks, benefits, complications, treatment options, and expected outcomes were discussed with the patient. The possibilities of reaction to medication, pulmonary aspiration, perforation of viscus, bleeding, recurrent infection, finding a normal gallbladder, the need for additional procedures, failure to diagnose a condition, the possible need to convert to an open procedure, and creating a complication requiring transfusion or operation were discussed with the patient. The likelihood of improving the patient's symptoms with return to their baseline status is good.  The patient and/or family concurred with the proposed plan, giving informed consent. The site of surgery properly noted. The patient was taken to Operating Room, identified as SANII GAGEN and the procedure verified as Laparoscopic Cholecystectomy with Intraoperative Cholangiogram. A Time Out was held and the above information confirmed. Antibiotic prophylaxis was administered.   Prior to the induction of general anesthesia, antibiotic prophylaxis was administered. General endotracheal anesthesia was then administered and tolerated well. After the induction, the abdomen was prepped with Chloraprep and draped in the sterile fashion. The patient was positioned in the supine position. She received IV Tylenol.  Local anesthetic agent was injected into the skin near the umbilicus  and an incision made. We dissected down to the abdominal fascia with blunt dissection.  The fascia was incised vertically and we entered the peritoneal cavity bluntly.  A pursestring suture of 0-Vicryl was placed around the fascial opening.  The Hasson cannula was inserted and secured with the stay suture.  Pneumoperitoneum was then created with CO2 and tolerated well without any adverse changes in the patient's vital signs. An 5-mm port was placed in the subxiphoid position.  Two 5-mm ports were placed in the right upper quadrant. All skin incisions were infiltrated with a local anesthetic agent before making the incision and placing the trocars.   We positioned the patient in reverse Trendelenburg, tilted slightly to the patient's left.  The gallbladder was identified, the fundus grasped and retracted cephalad. Adhesions were lysed bluntly and with the electrocautery where indicated, taking care not to injure any adjacent organs or viscus. The infundibulum was grasped and retracted laterally, exposing the peritoneum overlying the triangle of Calot. This was then divided and exposed in a blunt fashion. A critical view of the cystic duct and cystic artery was obtained.  The cystic duct was clearly identified and bluntly dissected circumferentially. The cystic duct was ligated with a clip distally.   An incision was made in the cystic duct and the [facility] cholangiogram catheter introduced. The catheter was secured using a clip. A cholangiogram was then obtained which showed good visualization of the distal and proximal biliary tree with no sign of filling defects or obstruction.  Contrast flowed easily into the duodenum. The catheter was then removed.   The cystic duct was then ligated with clips and divided. The cystic artery which had been identified & dissected free was ligated with clips and divided as well.   The gallbladder was dissected from the liver bed in retrograde fashion with the electrocautery. We  came across a posterior branch of the cystic artery which was clipped and  divided as well. The gallbladder was removed and placed in an Ecco sac.  The gallbladder and Ecco sac were then removed through the umbilical port site. The liver bed was irrigated and inspected. Hemostasis was achieved with the electrocautery. Copious irrigation was utilized and was repeatedly aspirated until clear.  The pursestring suture was used to close the umbilical fascia.    We again inspected the right upper quadrant for hemostasis.  The umbilical closure was inspected and there was a small air leak and nothing trapped within the closure. I placed an additional figure of eight 0 vicryl at the umbilical fascia. There was no air leak.  Pneumoperitoneum was released as we removed the trocars.  4-0 Monocryl was used to close the skin.   Dermabond was applied. The patient was then extubated and brought to the recovery room in stable condition. Instrument, sponge, and needle counts were correct at closure and at the conclusion of the case.   Findings: Chronic Cholecystitis with Cholelithiasis  Estimated Blood Loss: Minimal         Drains: none         Specimens: Gallbladder           Complications: None; patient tolerated the procedure well.         Disposition: PACU - hemodynamically stable.         Condition: stable  [name]. [name], MD, FACS General, Bariatric, & Minimally Invasive Surgery [facility], [place]

## 2015-03-04 NOTE — Anesthesia Procedure Notes (Signed)
Procedure Name: Intubation Date/Time: 03/04/2015 7:45 AM Performed by: Manus Gunning, Karaline Buresh J Pre-anesthesia Checklist: Patient identified, Timeout performed, Emergency Drugs available, Suction available and Patient being monitored Patient Re-evaluated:Patient Re-evaluated prior to inductionOxygen Delivery Method: Circle system utilized Preoxygenation: Pre-oxygenation with 100% oxygen Intubation Type: IV induction Ventilation: Mask ventilation without difficulty Laryngoscope Size: Mac and 3 Grade View: Grade II Tube type: Oral Tube size: 7.0 mm Number of attempts: 1 Placement Confirmation: ETT inserted through vocal cords under direct vision,  breath sounds checked- equal and bilateral and positive ETCO2 Secured at: 21 cm Tube secured with: Tape Dental Injury: Teeth and Oropharynx as per pre-operative assessment

## 2015-03-05 NOTE — Anesthesia Postprocedure Evaluation (Signed)
Anesthesia Post Note  Patient: VALLARIE EMBERTON  Procedure(s) Performed: Procedure(s) (LRB): LAPAROSCOPIC CHOLECYSTECTOMY WITH INTRAOPERATIVE CHOLANGIOGRAM (N/A)  Patient location during evaluation: PACU Anesthesia Type: General Level of consciousness: awake Pain management: pain level controlled Vital Signs Assessment: post-procedure vital signs reviewed and stable Respiratory status: spontaneous breathing Cardiovascular status: stable Postop Assessment: no signs of nausea or vomiting Anesthetic complications: no    Last Vitals:  Filed Vitals:   03/04/15 1030 03/04/15 1037  BP:  138/71  Pulse: 58 50  Temp:    Resp: 16     Last Pain:  Filed Vitals:   03/04/15 1100  PainSc: 0-No pain                 Deloris Mittag

## 2015-03-07 ENCOUNTER — Encounter (HOSPITAL_COMMUNITY): Payer: Self-pay | Admitting: General Surgery

## 2015-03-15 ENCOUNTER — Ambulatory Visit
Admission: RE | Admit: 2015-03-15 | Discharge: 2015-03-15 | Disposition: A | Discharge status: Home / Self Care | Payer: Commercial Managed Care - HMO | Source: Ambulatory Visit

## 2015-03-15 DIAGNOSIS — Z1231 Encounter for screening mammogram for malignant neoplasm of breast: Secondary | ICD-10-CM

## 2015-03-25 DIAGNOSIS — H01004 Unspecified blepharitis left upper eyelid: Secondary | ICD-10-CM | POA: Diagnosis not present

## 2015-03-25 DIAGNOSIS — H02835 Dermatochalasis of left lower eyelid: Secondary | ICD-10-CM | POA: Diagnosis not present

## 2015-03-25 DIAGNOSIS — H02832 Dermatochalasis of right lower eyelid: Secondary | ICD-10-CM | POA: Diagnosis not present

## 2015-03-25 DIAGNOSIS — H02834 Dermatochalasis of left upper eyelid: Secondary | ICD-10-CM | POA: Diagnosis not present

## 2015-03-25 DIAGNOSIS — H01002 Unspecified blepharitis right lower eyelid: Secondary | ICD-10-CM | POA: Diagnosis not present

## 2015-03-25 DIAGNOSIS — H02831 Dermatochalasis of right upper eyelid: Secondary | ICD-10-CM | POA: Diagnosis not present

## 2015-03-25 DIAGNOSIS — H0015 Chalazion left lower eyelid: Secondary | ICD-10-CM | POA: Diagnosis not present

## 2015-03-25 DIAGNOSIS — H01005 Unspecified blepharitis left lower eyelid: Secondary | ICD-10-CM | POA: Diagnosis not present

## 2015-03-25 DIAGNOSIS — H01001 Unspecified blepharitis right upper eyelid: Secondary | ICD-10-CM | POA: Diagnosis not present

## 2015-03-29 ENCOUNTER — Encounter: Payer: Self-pay | Admitting: Diagnostic Neuroimaging

## 2015-03-29 ENCOUNTER — Ambulatory Visit (INDEPENDENT_AMBULATORY_CARE_PROVIDER_SITE_OTHER): Payer: Commercial Managed Care - HMO | Admitting: Diagnostic Neuroimaging

## 2015-03-29 VITALS — BP 105/68 | HR 69 | Ht 63.0 in | Wt 155.6 lb

## 2015-03-29 DIAGNOSIS — H8110 Benign paroxysmal vertigo, unspecified ear: Secondary | ICD-10-CM | POA: Diagnosis not present

## 2015-03-29 DIAGNOSIS — R51 Headache: Secondary | ICD-10-CM | POA: Diagnosis not present

## 2015-03-29 DIAGNOSIS — F0781 Postconcussional syndrome: Secondary | ICD-10-CM | POA: Diagnosis not present

## 2015-03-29 DIAGNOSIS — R519 Headache, unspecified: Secondary | ICD-10-CM

## 2015-03-29 NOTE — Patient Instructions (Addendum)
Thank you for coming to see [place] at [facility]. I hope we have been able to provide you high quality care today.  You may receive a patient satisfaction survey over the next few weeks. We would appreciate your feedback and comments so that we may continue to improve ourselves and the health of our patients.  - will check MRI brain  - increase gabapentin to 385m twice a day  - gradually increase physical activity / movement  - consider psychiatry / psychology evaluation     ~~~~~~~~~~~~~~~~~~~~~~~~~~~~~~~~~~~~~~~~~~~~~~~~~~~~~~~~~~~~~~~~~  DR. Caiden Monsivais'S GUIDE TO HAPPY AND HEALTHY LIVING These are some of my general health and wellness recommendations. Some of them may apply to you better than others. Please use common sense as you try these suggestions and feel free to ask me any questions.   ACTIVITY/FITNESS Mental, social, emotional and physical stimulation are very important for brain and body health. Try learning a new activity (arts, music, language, sports, games).  Keep moving your body to the best of your abilities. You can do this at home, inside or outside, the park, community center, gym or anywhere you like. Consider a physical therapist or personal trainer to get started. Consider the app Sworkit. Fitness trackers such as smart-watches, smart-phones or Fitbits can help as well.   NUTRITION Eat more plants: colorful vegetables, nuts, seeds and berries.  Eat less sugar, salt, preservatives and processed foods.  Avoid toxins such as cigarettes and alcohol.  Drink water when you are thirsty. Warm water with a slice of lemon is an excellent morning drink to start the day.  Consider these websites for more information The Nutrition Source (hhttps://www.henry-hernandez.biz/ [employer] Nutrition (wWindowBlog.ch   RELAXATION Consider practicing mindfulness meditation or other relaxation techniques such as deep  breathing, prayer, yoga, tai chi, massage. See website mindful.org or the apps Headspace or Calm to help get started.   SLEEP Try to get at least 7-8+ hours sleep per day. Regular exercise and reduced caffeine will help you sleep better. Practice good sleep hygeine techniques. See website sleep.org for more information.   PLANNING Prepare estate planning, living will, healthcare POA documents. Sometimes this is best planned with the help of an attorney. Theconversationproject.org and agingwithdignity.org are excellent resources.

## 2015-03-29 NOTE — Progress Notes (Signed)
[facility]  PATIENT: [name] DOB: 1929/03/27  REFERRING CLINICIAN: Avva HISTORY FROM: patient  REASON FOR VISIT: follow up    HISTORICAL  CHIEF COMPLAINT:  Chief Complaint  Patient presents with  . Post concussion syndrome    rm 6, husband- Phil, "HA 5 out of 7 days, begins at top of head, radiates down right side of face"  . Follow-up    requested earlier FU    HISTORY OF PRESENT ILLNESS:   UPDATE 03/29/15: Since last visit, was doing well, then had episode of standing up --> then had sudden discomfort in head, prickly feeling, hot feeling, dizzy. Then next day had dizziness with head turned to the right. Then sometimes with head left. Now sxs slightly eased. Still with insomnia (interrupted sleep), daytime fatigue, grieving son's death in 10-04-13. Goes to sleep at 10pm. Then takes small ambien every night after midnight when she might wake up.   UPDATE 01/20/15: Since last visit, was stable, until Dec 2016, then more headaches. Now HA are stabilized. Some more insomnia, anxiety issues, reports still grieving re: son's death in 03-Sep-2013.   UPDATE 05-16-2014: Since last vist doing much better with PT evaluation and exercises ([facility]). She is pleased with progress. She feels stronger and and safer with her balance.  UPDATE 03/08/14: Since last visit, patient was traveling overseas to [place] when she tripped and fell on a concrete slab, falling on her right face and right wrist. Patient suffered significant right periorbital ecchymosis, facial bruising, as well as right forearm fracture. Patient continues to have pain in her right head, headaches, balance difficulty. Patient follows up to further investigate neuropathy.  PRIOR HPI (01/20/14): 82 year old right-handed female with hypercholesterolemia, breast cancer, here for evaluation of right scalp and facial discomfort, gait difficulty, postconcussion syndrome. 11/18/2013 patient tripped on her  driveway, fell and possibly hit her car in the driveway. She then bounced backwards and landed on her left hip. She had significant left hip pain but didn't feel any had pain initially. After going inside should be developed dizziness clamminess, possible low blood pressure and low pulse rate, and went to the emergency room. Patient was diagnosed with concussion. Since that time she has continued discomfort in her right scalp, especially when her hair is minimally dilated. She has pain that radiates down to her right facial region. Patient has had ongoing intermittent pain, went to the emergency room second time, diagnosed with possible trigeminal neuralgia. Also with remote shingles in the right frontal V1 region many years ago. Patient also having progressive gait and balance difficulty, with at least 5-6 years of numbness and tingling in the toes. Apparently she has been diagnosed with neuropathy, but without specific cause.   REVIEW OF SYSTEMS: Full 14 system review of systems performed and negative except for : eye discharge fatigue anxiety memory loss numbness insomnia.    ALLERGIES: Allergies  Allergen Reactions  . Dust Mite Extract Other (See Comments)    Sinus drainage  . Other Other (See Comments)    Smoke, Cat Dander cause sinus drainage    HOME MEDICATIONS: Outpatient Prescriptions Prior to Visit  Medication Sig Dispense Refill  . acetaminophen (TYLENOL) 325 MG tablet Take 650 mg by mouth every 6 (six) hours as needed for mild pain.    Marland Kitchen aspirin EC 81 MG tablet Take 81 mg by mouth at bedtime.    . Calcium Carb-Cholecalciferol (CALCIUM 600 + D PO) Take 600 mg by mouth 2 (  two) times daily.    . Cholecalciferol 2000 UNITS TABS Take 2,000 Units by mouth at bedtime.    Marland Kitchen esomeprazole (NEXIUM) 20 MG capsule Take 20 mg by mouth every evening.    . fexofenadine (ALLEGRA) 180 MG tablet Take 180 mg by mouth every other day.     . gabapentin (NEURONTIN) 100 MG capsule Take 100 mg by mouth 3  (three) times daily.  6  . LORazepam (ATIVAN) 0.5 MG tablet Take 0.5 mg by mouth every 8 (eight) hours as needed for anxiety.    . Multiple Vitamin (MULTIVITAMIN WITH MINERALS) TABS tablet Take 1 tablet by mouth daily.    . nortriptyline (PAMELOR) 25 MG capsule Take 25 mg by mouth at bedtime.    . Omega-3 Fatty Acids (FISH OIL) 1200 MG CAPS Take 1,200 mg by mouth 2 (two) times daily.    . raloxifene (EVISTA) 60 MG tablet Take 60 mg by mouth daily.    . traMADol (ULTRAM) 50 MG tablet Take 50 mg by mouth every 12 (twelve) hours as needed for moderate pain. As needed  4  . vitamin B-12 (CYANOCOBALAMIN) 1000 MCG tablet Take 1,000 mcg by mouth daily.    Marland Kitchen zolpidem (AMBIEN) 10 MG tablet Take 3.33 mg by mouth at bedtime as needed for sleep. 1/3 tablet    . fluticasone (FLONASE) 50 MCG/ACT nasal spray Place 1 spray into both nostrils daily as needed for allergies or rhinitis. Reported on 03/29/2015    . oxyCODONE (OXY IR/ROXICODONE) 5 MG immediate release tablet Take 1 tablet (5 mg total) by mouth every 4 (four) hours as needed for moderate pain, severe pain or breakthrough pain. 40 tablet 0   No facility-administered medications prior to visit.    PAST MEDICAL HISTORY: Past Medical History  Diagnosis Date  . Diverticulosis   . Allergic rhinitis   . Hyperlipidemia   . Headache(784.0)     sinus HAs  . Breast cancer ([place])     left breast cancer  . Arthritis   . Concussion   . Anxiety   . Depression   . GERD (gastroesophageal reflux disease)   . Complication of anesthesia     memory loss    PAST SURGICAL HISTORY: Past Surgical History  Procedure Laterality Date  . Total knee arthroplasty      right  . Nasal sinus surgery      2004  . Replacement total knee      left  . Abdominal hysterectomy    . Mastectomy      left  . Shoulder surgery Bilateral     repairs, rot cuff repair  . Esophagogastroduodenoscopy N/A 08/15/2012    Procedure: ESOPHAGOGASTRODUODENOSCOPY (EGD);  Surgeon:  Inda Castle, MD;  Location: [facility];  Service: Endoscopy;  Laterality: N/A;  . Reverse shoulder arthroplasty Left 11/11/2014    Procedure: LEFT REVERSE SHOULDER ARTHROPLASTY;  Surgeon: Justice Britain, MD;  Location: [facility];  Service: Orthopedics;  Laterality: Left;  . Cholecystectomy N/A 03/04/2015    Procedure: LAPAROSCOPIC CHOLECYSTECTOMY WITH INTRAOPERATIVE CHOLANGIOGRAM;  Surgeon: Greer Pickerel, MD;  Location: [facility] OR;  Service: General;  Laterality: N/A;    FAMILY HISTORY: Family History  Problem Relation Age of Onset  . Colon cancer Father 32  . Breast cancer Mother   . Pancreatic cancer Sister   . Leukemia Son     SOCIAL HISTORY:  Social History   Social History  . Marital Status: Married    Spouse Name: Doren Custard  . Number of  Children: 3  . Years of Education: MBA   Occupational History  . Retired    Social History Main Topics  . Smoking status: Former Smoker -- 0.50 packs/day for 6 years    Types: Cigarettes    Quit date: 01/15/1958  . Smokeless tobacco: Never Used  . Alcohol Use: No  . Drug Use: No  . Sexual Activity: Not on file   Other Topics Concern  . Not on file   Social History Narrative   Patient lives at home with spouse.   Caffeine use: none   3 children; 1 deceased     PHYSICAL EXAM  Filed Vitals:   03/29/15 1443  BP: 105/68  Pulse: 69  Height: 5\' 3"  (1.6 m)  Weight: 155 lb 9.6 oz (70.58 kg)   Wt Readings from Last 3 Encounters:  03/29/15 155 lb 9.6 oz (70.58 kg)  03/04/15 155 lb (70.308 kg)  02/25/15 155 lb (70.308 kg)   Body mass index is 27.57 kg/(m^2).  GENERAL EXAM: Patient is in no distress; well developed, nourished and groomed; neck is supple; MILD HIGH ARCH AND HAMMERTOES IN FEET  CARDIOVASCULAR: Regular rate and rhythm, no murmurs, no carotid bruits  NEUROLOGIC: MENTAL STATUS: awake, alert, language fluent, comprehension intact, naming intact, fund of knowledge appropriate CRANIAL NERVE: pupils equal and reactive to  light, visual fields full to confrontation, extraocular muscles intact, no nystagmus, facial sensation and strength symmetric, hearing intact, palate elevates symmetrically, uvula midline, shoulder shrug symmetric, tongue midline. MOTOR: normal bulk and tone, full strength in the BUE, BLE; EXCEPT LEFT DF WEAKNESS SENSORY: DECR LT, TEMP IN FEET; ABSENT VIB AT KNEES, ANKLES AND TOES COORDINATION: finger-nose-finger, fine finger movements normal REFLEXES: deep tendon reflexes TRACE and symmetric GAIT/STATION: narrow based gait; SCUFFS FEET. SLOW UNSTEADY TURNING. CANNOT TOE, HEEL OR TANDEM; UNSTEADY ON ROMBERG    DIAGNOSTIC DATA (LABS, IMAGING, TESTING) - I reviewed patient records, labs, notes, testing and imaging myself where available.  Lab Results  Component Value Date   WBC 5.7 02/25/2015   HGB 14.4 02/25/2015   HCT 43.2 02/25/2015   MCV 89.4 02/25/2015   PLT 284 02/25/2015      Component Value Date/Time   NA 141 02/25/2015 1047   K 4.4 02/25/2015 1047   CL 103 02/25/2015 1047   CO2 28 02/25/2015 1047   GLUCOSE 102* 02/25/2015 1047   BUN 17 02/25/2015 1047   CREATININE 0.75 02/25/2015 1047   CALCIUM 9.5 02/25/2015 1047   PROT 6.4* 02/25/2015 1047   PROT 6.6 01/20/2014 1237   ALBUMIN 3.5 02/25/2015 1047   AST 23 02/25/2015 1047   ALT 18 02/25/2015 1047   ALKPHOS 60 02/25/2015 1047   BILITOT 0.8 02/25/2015 1047   GFRNONAA >60 02/25/2015 1047   GFRAA >60 02/25/2015 1047   No results found for: CHOL, HDL, LDLCALC, LDLDIRECT, TRIG, CHOLHDL Lab Results  Component Value Date   HGBA1C 5.7* 01/20/2014   Lab Results  Component Value Date   VITAMINB12 >1999* 01/20/2014   Lab Results  Component Value Date   TSH 1.200 01/20/2014    01/02/14 CT head / maxillofacial 1. No acute intracranial pathology. 2. No acute osseous injury of the maxillofacial bones. 3. Mild generalized cerebral atrophy and chronic microvascular disease.  09/10/13 CTA head/neck 1. Negative for age  CTA head and neck. 2. Stable CT appearance of the brain.  01/20/14 Neuropathy panel - negative  03/15/14 MRI lumbar spine demonstrating multilevel degenerative changes as detailed above. Specifically: 1. Moderate left  foraminal narrowing at L1-L2 due to disc protrusion, endplate spurring and facet hypertrophy could lead to dynamic impingement of the left L1 nerve root  2. Moderately severe right lateral recess stenosis at L2-L3 due to disc protrusion, endplate spurring, mild facet hypertrophy leading to mild spinal stenosis. There could be right L3 nerve root compression at this level. 3. Moderately to severe fairly narrowed neural foramina and mild to moderate lateral recess stenosis at L3-L4 due to disc protrusion, endplate spurring and facet hypertrophy causing mild spinal stenosis. There could be impingement of either of the exiting L3 nerve root with less potential for dynamic impingement of the L4 nerve roots. 4. Moderate bilateral foraminal narrowing at L4-L5 due to disc bulge and facet hypertrophy that could lead to dynamic impingement of the exiting L4 nerve roots   5. Severe right lateral recess stenosis and bilateral moderate foraminal narrowing at L5-S1 due to anterolisthesis, severe facet hypertrophy and right paramedian disc herniation. There appears to be compression of the right S1 nerve root and there could also be dynamic impingement of the exiting L5 nerve roots.  03/19/14 EMG/NCS  1. Severe length dependent axonal sensorimotor polyneuropathy. 2. Bilateral lower lumbar radiculopathies.    ASSESSMENT AND PLAN  82 y.o. year old female with postconcussion syndrome (fall on 11/19/13) with resultant scalp numbness/sensitivity, facial pain. Also with suspected underlying peripheral neuropathy for past 5-6 years, without clear etiology (possible hereditary neuropathy). Then second fall on Feb 19, 2014, with right facial trauma (no fractures) and right forearm fracture.   EMG and MRI  lumbar spine confirm combination of severe axonal neuropathy + lumbar polyradiculopathy. I do not think patient would be a good surgical candidate at this time due to combination of neuropathy + lumbar spine disease, lack of back pain, advanced age, and relatively good improvement with conservative therapy. PT evaluation and exercises have significantly improved her balance and walking.    Dx: idiopathic hereditary neuropathy + lumbar polyradiculopathy + post-traumatic headaches + anxiety/insomnia  Post concussion syndrome - Plan: MR Brain Wo Contrast  Scalp pain - Plan: MR Brain Wo Contrast  Benign paroxysmal positional vertigo, unspecified laterality - Plan: MR Brain Wo Contrast     PLAN: - will check MRI brain - increase gabapentin to 300mg  twice a day - gradually increase physical activity / movement - consider psychiatry / psychology evaluation  Orders Placed This Encounter  Procedures  . MR Brain Wo Contrast   Return in about 3 months (around 06/29/2015).    Penni Bombard, MD [id number], [id number] PM Certified in Neurology, Neurophysiology and Neuroimaging  [facility] [address] [place], [place] [address] [phone]

## 2015-03-31 ENCOUNTER — Encounter: Payer: Self-pay | Admitting: Diagnostic Neuroimaging

## 2015-03-31 ENCOUNTER — Telehealth: Payer: Self-pay | Admitting: Diagnostic Neuroimaging

## 2015-03-31 DIAGNOSIS — R42 Dizziness and giddiness: Secondary | ICD-10-CM

## 2015-03-31 NOTE — Telephone Encounter (Signed)
LVM req call back.

## 2015-03-31 NOTE — Telephone Encounter (Signed)
Patient returned call and was very adamant that she should not have MRI due to stapedectomy on her left ear in the past. She stated "that was listed as one of the things I should not do". She stated "If the dr thinks I need a scan, I'll do the ct scan. But I don't know if I need any more scans." Informed her would route her concerns to Dr Leta Baptist. She verbalized understanding, appreciation.

## 2015-03-31 NOTE — Telephone Encounter (Signed)
i think MRI brain should be ok. She had an mri lumbar spine in 2016. i don't see any metal in her ear on prior CT scans. i would recommend MRI brain, or if she wants, we can switch to CT head.

## 2015-03-31 NOTE — Telephone Encounter (Signed)
Patient called to advise, she can't have MRI due to having metal in left ear. Patient apologizes, she didn't think of this when she was here Tuesday.

## 2015-04-01 NOTE — Telephone Encounter (Signed)
Per Dr Leta Baptist, spoke with patient and informed her that Dr Leta Baptist ordered ct scan instead of MRI. Advised she will get a call to schedule, most likely next week. She verbalized understanding, appreciation.

## 2015-04-08 ENCOUNTER — Ambulatory Visit
Admission: RE | Admit: 2015-04-08 | Discharge: 2015-04-08 | Disposition: A | Discharge status: Home / Self Care | Payer: Commercial Managed Care - HMO | Source: Ambulatory Visit | Attending: Diagnostic Neuroimaging | Admitting: Diagnostic Neuroimaging

## 2015-04-08 DIAGNOSIS — R42 Dizziness and giddiness: Secondary | ICD-10-CM

## 2015-04-13 ENCOUNTER — Telehealth: Payer: Self-pay | Admitting: *Deleted

## 2015-04-13 NOTE — Telephone Encounter (Signed)
Spoke with patient and informed her, per Dr Leta Baptist, her ct scan results are okay, no significant change or new findings. She verbalized understanding, appreciation.

## 2015-05-16 DIAGNOSIS — H0015 Chalazion left lower eyelid: Secondary | ICD-10-CM | POA: Diagnosis not present

## 2015-05-17 DIAGNOSIS — M25561 Pain in right knee: Secondary | ICD-10-CM | POA: Diagnosis not present

## 2015-05-17 DIAGNOSIS — M7061 Trochanteric bursitis, right hip: Secondary | ICD-10-CM | POA: Diagnosis not present

## 2015-05-18 DIAGNOSIS — Z471 Aftercare following joint replacement surgery: Secondary | ICD-10-CM | POA: Diagnosis not present

## 2015-05-18 DIAGNOSIS — M25511 Pain in right shoulder: Secondary | ICD-10-CM | POA: Diagnosis not present

## 2015-05-18 DIAGNOSIS — G8929 Other chronic pain: Secondary | ICD-10-CM | POA: Diagnosis not present

## 2015-05-18 DIAGNOSIS — Z96612 Presence of left artificial shoulder joint: Secondary | ICD-10-CM | POA: Diagnosis not present

## 2015-05-23 DIAGNOSIS — M25551 Pain in right hip: Secondary | ICD-10-CM | POA: Diagnosis not present

## 2015-05-23 DIAGNOSIS — M7061 Trochanteric bursitis, right hip: Secondary | ICD-10-CM | POA: Diagnosis not present

## 2015-05-27 DIAGNOSIS — M25551 Pain in right hip: Secondary | ICD-10-CM | POA: Diagnosis not present

## 2015-05-27 DIAGNOSIS — M7061 Trochanteric bursitis, right hip: Secondary | ICD-10-CM | POA: Diagnosis not present

## 2015-06-01 DIAGNOSIS — M25551 Pain in right hip: Secondary | ICD-10-CM | POA: Diagnosis not present

## 2015-06-01 DIAGNOSIS — M7061 Trochanteric bursitis, right hip: Secondary | ICD-10-CM | POA: Diagnosis not present

## 2015-06-06 DIAGNOSIS — H0015 Chalazion left lower eyelid: Secondary | ICD-10-CM | POA: Diagnosis not present

## 2015-06-10 DIAGNOSIS — M7061 Trochanteric bursitis, right hip: Secondary | ICD-10-CM | POA: Diagnosis not present

## 2015-06-10 DIAGNOSIS — M25551 Pain in right hip: Secondary | ICD-10-CM | POA: Diagnosis not present

## 2015-06-14 DIAGNOSIS — M2042 Other hammer toe(s) (acquired), left foot: Secondary | ICD-10-CM | POA: Diagnosis not present

## 2015-06-14 DIAGNOSIS — M2012 Hallux valgus (acquired), left foot: Secondary | ICD-10-CM | POA: Diagnosis not present

## 2015-06-14 DIAGNOSIS — M6702 Short Achilles tendon (acquired), left ankle: Secondary | ICD-10-CM | POA: Diagnosis not present

## 2015-06-28 ENCOUNTER — Ambulatory Visit: Payer: Commercial Managed Care - HMO | Admitting: Diagnostic Neuroimaging

## 2015-07-01 DIAGNOSIS — M1711 Unilateral primary osteoarthritis, right knee: Secondary | ICD-10-CM | POA: Diagnosis not present

## 2015-07-28 DIAGNOSIS — D692 Other nonthrombocytopenic purpura: Secondary | ICD-10-CM | POA: Diagnosis not present

## 2015-07-28 DIAGNOSIS — R413 Other amnesia: Secondary | ICD-10-CM | POA: Diagnosis not present

## 2015-07-28 DIAGNOSIS — M1990 Unspecified osteoarthritis, unspecified site: Secondary | ICD-10-CM | POA: Diagnosis not present

## 2015-07-28 DIAGNOSIS — R32 Unspecified urinary incontinence: Secondary | ICD-10-CM | POA: Diagnosis not present

## 2015-07-28 DIAGNOSIS — K5900 Constipation, unspecified: Secondary | ICD-10-CM | POA: Diagnosis not present

## 2015-07-28 DIAGNOSIS — K8018 Calculus of gallbladder with other cholecystitis without obstruction: Secondary | ICD-10-CM | POA: Diagnosis not present

## 2015-07-28 DIAGNOSIS — F419 Anxiety disorder, unspecified: Secondary | ICD-10-CM | POA: Diagnosis not present

## 2015-07-28 DIAGNOSIS — E038 Other specified hypothyroidism: Secondary | ICD-10-CM | POA: Diagnosis not present

## 2015-07-28 DIAGNOSIS — E784 Other hyperlipidemia: Secondary | ICD-10-CM | POA: Diagnosis not present

## 2015-07-28 DIAGNOSIS — N390 Urinary tract infection, site not specified: Secondary | ICD-10-CM | POA: Diagnosis not present

## 2015-08-03 ENCOUNTER — Encounter (HOSPITAL_COMMUNITY)
Admission: RE | Admit: 2015-08-03 | Discharge: 2015-08-03 | Disposition: A | Discharge status: Home / Self Care | Payer: Commercial Managed Care - HMO | Source: Ambulatory Visit | Attending: Orthopedic Surgery | Admitting: Orthopedic Surgery

## 2015-08-03 ENCOUNTER — Encounter (HOSPITAL_COMMUNITY): Payer: Self-pay

## 2015-08-03 DIAGNOSIS — Z01812 Encounter for preprocedural laboratory examination: Secondary | ICD-10-CM | POA: Insufficient documentation

## 2015-08-03 DIAGNOSIS — M1711 Unilateral primary osteoarthritis, right knee: Secondary | ICD-10-CM | POA: Diagnosis not present

## 2015-08-03 HISTORY — DX: Myoneural disorder, unspecified: G70.9

## 2015-08-03 LAB — CBC
HCT: 43.6 % (ref 36.0–46.0)
Hemoglobin: 14.4 g/dL (ref 12.0–15.0)
MCH: 29.4 pg (ref 26.0–34.0)
MCHC: 33 g/dL (ref 30.0–36.0)
MCV: 89.2 fL (ref 78.0–100.0)
PLATELETS: 283 10*3/uL (ref 150–400)
RBC: 4.89 MIL/uL (ref 3.87–5.11)
RDW: 13.9 % (ref 11.5–15.5)
WBC: 6.4 10*3/uL (ref 4.0–10.5)

## 2015-08-03 LAB — SURGICAL PCR SCREEN
MRSA, PCR: NEGATIVE
Staphylococcus aureus: NEGATIVE

## 2015-08-03 NOTE — H&P (Signed)
TOTAL KNEE ADMISSION H&P  Patient is being admitted for right total knee arthroplasty.  Subjective:  Chief Complaint:     Right knee primary OA /pain  HPI: [name], 82 y.o. female, has a history of pain and functional disability in the right knee due to arthritis and has failed non-surgical conservative treatments for greater than 12 weeks to include NSAID's and/or analgesics, corticosteriod injections, use of assistive devices and activity modification.  Onset of symptoms was gradual, starting 2+ years ago with gradually worsening course since that time. The patient noted prior procedures on the knee to include  arthroplasty on the left knee per Dr. Wynelle Link.  Patient currently rates pain in the right knee(s) at 9 out of 10 with activity. Patient has worsening of pain with activity and weight bearing, pain that interferes with activities of daily living, pain with passive range of motion, crepitus and joint swelling.  Patient has evidence of periarticular osteophytes and joint space narrowing by imaging studies.  There is no active infection.   Risks, benefits and expectations were discussed with the patient.  Risks including but not limited to the risk of anesthesia, blood clots, nerve damage, blood vessel damage, failure of the prosthesis, infection and up to and including death.  Patient understand the risks, benefits and expectations and wishes to proceed with surgery.   PCP: Tivis Ringer, MD  D/C Plans:      Home  Post-op Meds:       No Rx given  Tranexamic Acid:      To be given - IV   Decadron:      Is to be given  FYI:     ASA  Norco    Patient Active Problem List   Diagnosis Date Noted  . S/p reverse total shoulder arthroplasty 11/11/2014  . Vertigo 09/10/2013  . Nausea alone 08/15/2012  . Duodenal ulcer 08/15/2012  . Personal history of colonic polyps 12/11/2011  . ASTHMATIC BRONCHITIS, ACUTE 03/31/2007  . ALLERGIC RHINITIS 03/31/2007  . ESOPHAGEAL REFLUX  03/31/2007   Past Medical History  Diagnosis Date  . Diverticulosis   . Allergic rhinitis   . Hyperlipidemia   . Headache(784.0)     sinus HAs  . Breast cancer ([place])     left breast cancer  . Arthritis   . Concussion   . Anxiety   . Depression   . GERD (gastroesophageal reflux disease)   . Complication of anesthesia     memory loss    Past Surgical History  Procedure Laterality Date  . Total knee arthroplasty      right  . Nasal sinus surgery      2004  . Replacement total knee      left  . Abdominal hysterectomy    . Mastectomy      left  . Shoulder surgery Bilateral     repairs, rot cuff repair  . Esophagogastroduodenoscopy N/A 08/15/2012    Procedure: ESOPHAGOGASTRODUODENOSCOPY (EGD);  Surgeon: Inda Castle, MD;  Location: [facility];  Service: Endoscopy;  Laterality: N/A;  . Reverse shoulder arthroplasty Left 11/11/2014    Procedure: LEFT REVERSE SHOULDER ARTHROPLASTY;  Surgeon: Justice Britain, MD;  Location: [facility];  Service: Orthopedics;  Laterality: Left;  . Cholecystectomy N/A 03/04/2015    Procedure: LAPAROSCOPIC CHOLECYSTECTOMY WITH INTRAOPERATIVE CHOLANGIOGRAM;  Surgeon: Greer Pickerel, MD;  Location: [facility];  Service: General;  Laterality: N/A;  . Stapedectomy Left     left ear    No prescriptions prior to  admission   Allergies  Allergen Reactions  . Dust Mite Extract Other (See Comments)    Sinus drainage  . Other Other (See Comments)    Smoke, Cat Dander cause sinus drainage    Social History  Substance Use Topics  . Smoking status: Former Smoker -- 0.50 packs/day for 6 years    Types: Cigarettes    Quit date: 01/15/1958  . Smokeless tobacco: Never Used  . Alcohol Use: No    Family History  Problem Relation Age of Onset  . Colon cancer Father 29  . Breast cancer Mother   . Pancreatic cancer Sister   . Leukemia Son      Review of Systems  Constitutional: Positive for malaise/fatigue.  Eyes: Negative.   Respiratory: Negative.    Cardiovascular: Negative.   Gastrointestinal: Positive for heartburn.  Genitourinary: Positive for frequency.  Musculoskeletal: Positive for joint pain.  Skin: Negative.   Neurological: Positive for headaches.  Endo/Heme/Allergies: Positive for environmental allergies.  Psychiatric/Behavioral: The patient has insomnia.     Objective:  Physical Exam  Constitutional: She is oriented to person, place, and time. She appears well-developed.  HENT:  Head: Normocephalic.  Eyes: Pupils are equal, round, and reactive to light.  Neck: Neck supple. No JVD present. No tracheal deviation present. No thyromegaly present.  Cardiovascular: Normal rate, regular rhythm, normal heart sounds and intact distal pulses.   Respiratory: Effort normal and breath sounds normal. No stridor. No respiratory distress. She has no wheezes.  GI: Soft. There is no tenderness. There is no guarding.  Musculoskeletal:       Right knee: She exhibits decreased range of motion, swelling, abnormal alignment and bony tenderness. She exhibits no ecchymosis, no deformity, no laceration and no erythema. Tenderness found.  Lymphadenopathy:    She has no cervical adenopathy.  Neurological: She is alert and oriented to person, place, and time. A sensory deficit (bilateral neuropathy) is present.  Skin: Skin is warm and dry.  Psychiatric: She has a normal mood and affect.      Labs:  Estimated body mass index is 27.57 kg/(m^2) as calculated from the following:   Height as of 03/29/15: 5\' 3"  (1.6 m).   Weight as of 03/29/15: 70.58 kg (155 lb 9.6 oz).   Imaging Review Plain radiographs demonstrate severe degenerative joint disease of the right knee(s).  The bone quality appears to be good for age and reported activity level.  Assessment/Plan:  End stage arthritis, right knee   The patient history, physical examination, clinical judgment of the provider and imaging studies are consistent with end stage degenerative joint  disease of the right knee(s) and total knee arthroplasty is deemed medically necessary. The treatment options including medical management, injection therapy arthroscopy and arthroplasty were discussed at length. The risks and benefits of total knee arthroplasty were presented and reviewed. The risks due to aseptic loosening, infection, stiffness, patella tracking problems, thromboembolic complications and other imponderables were discussed. The patient acknowledged the explanation, agreed to proceed with the plan and consent was signed. Patient is being admitted for inpatient treatment for surgery, pain control, PT, OT, prophylactic antibiotics, VTE prophylaxis, progressive ambulation and ADL's and discharge planning. The patient is planning to be discharged home with home health services.      [place] Chanita Boden   PA-C  08/03/2015, 7:42 AM

## 2015-08-03 NOTE — Patient Instructions (Signed)
[name]  08/03/2015   Your procedure is scheduled on: 08/09/15  Report to [facility] Main  Entrance take [facility]  elevators to 3rd floor to  [place] at 11:00 AM.  Call this number if you have problems the morning of surgery [phone]   Remember: ONLY 1 PERSON MAY GO WITH YOU TO SHORT STAY TO GET  READY MORNING OF [place].  Do not eat food or drink liquids :After Midnight. Monday     Take these medicines the morning of surgery with A SIP OF WATER: Evista, Lorazepam if needed                                You may not have any metal on your body including hair pins and              piercings  Do not wear jewelry, make-up, lotions, powders or perfumes, deodorant             Do not wear nail polish.  Do not shave  48 hours prior to surgery.                 Do not bring valuables to the hospital. [place].  Contacts, dentures or bridgework may not be worn into surgery.  Leave suitcase in the car. After surgery it may be brought to your room.             _____________________________________________________________________             [facility] - Preparing for Surgery Before surgery, you can play an important role.  Because skin is not sterile, your skin needs to be as free of germs as possible.  You can reduce the number of germs on your skin by washing with CHG (chlorahexidine gluconate) soap before surgery.  CHG is an antiseptic cleaner which kills germs and bonds with the skin to continue killing germs even after washing. Please DO NOT use if you have an allergy to CHG or antibacterial soaps.  If your skin becomes reddened/irritated stop using the CHG and inform your nurse when you arrive at Short Stay. Do not shave (including legs and underarms) for at least 48 hours prior to the first CHG shower.  You may shave your face/neck. Please follow these instructions carefully:  1.   Shower with CHG Soap the night before surgery and the  morning of Surgery.  2.  If you choose to wash your hair, wash your hair first as usual with your  normal  shampoo.  3.  After you shampoo, rinse your hair and body thoroughly to remove the  shampoo.                           4.  Use CHG as you would any other liquid soap.  You can apply chg directly  to the skin and wash                       Gently with a scrungie or clean washcloth.  5.  Apply the CHG Soap to your body ONLY FROM THE NECK DOWN.   Do not use on face/ open  Wound or open sores. Avoid contact with eyes, ears mouth and genitals (private parts).                       Wash face,  Genitals (private parts) with your normal soap.             6.  Wash thoroughly, paying special attention to the area where your surgery  will be performed.  7.  Thoroughly rinse your body with warm water from the neck down.  8.  DO NOT shower/wash with your normal soap after using and rinsing off  the CHG Soap.                9.  Pat yourself dry with a clean towel.            10.  Wear clean pajamas.            11.  Place clean sheets on your bed the night of your first shower and do not  sleep with pets. Day of Surgery : Do not apply any lotions/deodorants the morning of surgery.  Please wear clean clothes to the hospital/surgery center.  FAILURE TO FOLLOW THESE INSTRUCTIONS MAY RESULT IN THE CANCELLATION OF YOUR SURGERY PATIENT SIGNATURE_________________________________  NURSE SIGNATURE__________________________________  ________________________________________________________________________

## 2015-08-09 ENCOUNTER — Encounter (HOSPITAL_COMMUNITY): Payer: Self-pay | Admitting: *Deleted

## 2015-08-09 ENCOUNTER — Inpatient Hospital Stay (HOSPITAL_COMMUNITY): Payer: Commercial Managed Care - HMO | Admitting: Registered Nurse

## 2015-08-09 ENCOUNTER — Encounter (HOSPITAL_COMMUNITY)
Admission: RE | Disposition: A | Discharge status: Home Health | Payer: Self-pay | Source: Ambulatory Visit | Attending: Orthopedic Surgery

## 2015-08-09 ENCOUNTER — Inpatient Hospital Stay (HOSPITAL_COMMUNITY)
Admission: RE | Admit: 2015-08-09 | Discharge: 2015-08-11 | DRG: 470 | Disposition: A | Discharge status: Home Health | Payer: Commercial Managed Care - HMO | Source: Ambulatory Visit | Attending: Orthopedic Surgery | Admitting: Orthopedic Surgery

## 2015-08-09 DIAGNOSIS — Z800 Family history of malignant neoplasm of digestive organs: Secondary | ICD-10-CM | POA: Diagnosis not present

## 2015-08-09 DIAGNOSIS — Z6827 Body mass index (BMI) 27.0-27.9, adult: Secondary | ICD-10-CM | POA: Diagnosis not present

## 2015-08-09 DIAGNOSIS — M1711 Unilateral primary osteoarthritis, right knee: Secondary | ICD-10-CM | POA: Diagnosis not present

## 2015-08-09 DIAGNOSIS — J45909 Unspecified asthma, uncomplicated: Secondary | ICD-10-CM | POA: Diagnosis present

## 2015-08-09 DIAGNOSIS — K219 Gastro-esophageal reflux disease without esophagitis: Secondary | ICD-10-CM | POA: Diagnosis not present

## 2015-08-09 DIAGNOSIS — Z86010 Personal history of colonic polyps: Secondary | ICD-10-CM

## 2015-08-09 DIAGNOSIS — Z803 Family history of malignant neoplasm of breast: Secondary | ICD-10-CM | POA: Diagnosis not present

## 2015-08-09 DIAGNOSIS — Z87891 Personal history of nicotine dependence: Secondary | ICD-10-CM | POA: Diagnosis not present

## 2015-08-09 DIAGNOSIS — Z96612 Presence of left artificial shoulder joint: Secondary | ICD-10-CM | POA: Diagnosis present

## 2015-08-09 DIAGNOSIS — Z96659 Presence of unspecified artificial knee joint: Secondary | ICD-10-CM

## 2015-08-09 DIAGNOSIS — M25461 Effusion, right knee: Secondary | ICD-10-CM | POA: Diagnosis not present

## 2015-08-09 DIAGNOSIS — Z8711 Personal history of peptic ulcer disease: Secondary | ICD-10-CM | POA: Diagnosis not present

## 2015-08-09 DIAGNOSIS — Z853 Personal history of malignant neoplasm of breast: Secondary | ICD-10-CM

## 2015-08-09 DIAGNOSIS — Z806 Family history of leukemia: Secondary | ICD-10-CM

## 2015-08-09 DIAGNOSIS — M659 Synovitis and tenosynovitis, unspecified: Secondary | ICD-10-CM | POA: Diagnosis present

## 2015-08-09 DIAGNOSIS — Z96651 Presence of right artificial knee joint: Secondary | ICD-10-CM

## 2015-08-09 DIAGNOSIS — E785 Hyperlipidemia, unspecified: Secondary | ICD-10-CM | POA: Diagnosis present

## 2015-08-09 DIAGNOSIS — M25761 Osteophyte, right knee: Secondary | ICD-10-CM | POA: Diagnosis present

## 2015-08-09 DIAGNOSIS — E663 Overweight: Secondary | ICD-10-CM | POA: Diagnosis not present

## 2015-08-09 DIAGNOSIS — F419 Anxiety disorder, unspecified: Secondary | ICD-10-CM | POA: Diagnosis present

## 2015-08-09 DIAGNOSIS — M179 Osteoarthritis of knee, unspecified: Secondary | ICD-10-CM | POA: Diagnosis not present

## 2015-08-09 DIAGNOSIS — M25561 Pain in right knee: Secondary | ICD-10-CM | POA: Diagnosis present

## 2015-08-09 HISTORY — PX: TOTAL KNEE ARTHROPLASTY: SHX125

## 2015-08-09 LAB — TYPE AND SCREEN
ABO/RH(D): A POS
ANTIBODY SCREEN: NEGATIVE

## 2015-08-09 SURGERY — ARTHROPLASTY, KNEE, TOTAL
Anesthesia: Monitor Anesthesia Care | Site: Knee | Laterality: Right

## 2015-08-09 MED ORDER — PROPOFOL 500 MG/50ML IV EMUL
INTRAVENOUS | Status: DC | PRN
  Administered 2015-08-09: 100 ug/kg/min via INTRAVENOUS

## 2015-08-09 MED ORDER — HYDROMORPHONE HCL 2 MG/ML IJ SOLN
INTRAMUSCULAR | Status: AC
  Filled 2015-08-09: qty 1

## 2015-08-09 MED ORDER — ESOMEPRAZOLE MAGNESIUM 20 MG PO CPDR
20.0000 mg | DELAYED_RELEASE_CAPSULE | Freq: Every day | ORAL | Status: DC
  Administered 2015-08-09 – 2015-08-10 (×2): 20 mg via ORAL
  Filled 2015-08-09 (×2): qty 1

## 2015-08-09 MED ORDER — CHLORHEXIDINE GLUCONATE 4 % EX LIQD: 60.0000 mL | Freq: Once | CUTANEOUS | Status: DC

## 2015-08-09 MED ORDER — HYDROMORPHONE HCL 1 MG/ML IJ SOLN: 0.5000 mg | INTRAMUSCULAR | Status: DC | PRN

## 2015-08-09 MED ORDER — METHOCARBAMOL 1000 MG/10ML IJ SOLN
500.0000 mg | Freq: Four times a day (QID) | INTRAVENOUS | Status: DC | PRN
  Administered 2015-08-09: 500 mg via INTRAVENOUS
  Filled 2015-08-09: qty 550
  Filled 2015-08-09: qty 5

## 2015-08-09 MED ORDER — SODIUM CHLORIDE 0.9 % IJ SOLN
INTRAMUSCULAR | Status: DC | PRN
  Administered 2015-08-09: 30 mL

## 2015-08-09 MED ORDER — CEFAZOLIN SODIUM-DEXTROSE 2-4 GM/100ML-% IV SOLN
2.0000 g | INTRAVENOUS | Status: AC
  Administered 2015-08-09: 2 g via INTRAVENOUS
  Filled 2015-08-09: qty 100

## 2015-08-09 MED ORDER — ALUM & MAG HYDROXIDE-SIMETH 200-200-20 MG/5ML PO SUSP: 30.0000 mL | ORAL | Status: DC | PRN

## 2015-08-09 MED ORDER — PROPOFOL 10 MG/ML IV BOLUS
INTRAVENOUS | Status: AC
  Filled 2015-08-09: qty 20

## 2015-08-09 MED ORDER — LACTATED RINGERS IV SOLN
INTRAVENOUS | Status: DC | PRN
  Administered 2015-08-09 (×3): via INTRAVENOUS

## 2015-08-09 MED ORDER — HYDROMORPHONE HCL 1 MG/ML IJ SOLN
INTRAMUSCULAR | Status: DC | PRN
  Administered 2015-08-09: 1 mg via INTRAVENOUS

## 2015-08-09 MED ORDER — TRANEXAMIC ACID 1000 MG/10ML IV SOLN
1000.0000 mg | INTRAVENOUS | Status: AC
  Administered 2015-08-09: 1000 mg via INTRAVENOUS
  Filled 2015-08-09: qty 10

## 2015-08-09 MED ORDER — RALOXIFENE HCL 60 MG PO TABS
60.0000 mg | ORAL_TABLET | Freq: Every day | ORAL | Status: DC
  Administered 2015-08-10 – 2015-08-11 (×2): 60 mg via ORAL
  Filled 2015-08-09 (×2): qty 1

## 2015-08-09 MED ORDER — DOCUSATE SODIUM 100 MG PO CAPS
100.0000 mg | ORAL_CAPSULE | Freq: Two times a day (BID) | ORAL | Status: DC
  Administered 2015-08-09 – 2015-08-11 (×4): 100 mg via ORAL
  Filled 2015-08-09 (×4): qty 1

## 2015-08-09 MED ORDER — BUPIVACAINE-EPINEPHRINE (PF) 0.25% -1:200000 IJ SOLN
INTRAMUSCULAR | Status: AC
  Filled 2015-08-09: qty 30

## 2015-08-09 MED ORDER — HYDROCODONE-ACETAMINOPHEN 7.5-325 MG PO TABS
1.0000 | ORAL_TABLET | ORAL | Status: DC
  Administered 2015-08-09: 1 via ORAL
  Administered 2015-08-10 (×3): 2 via ORAL
  Filled 2015-08-09: qty 2
  Filled 2015-08-09: qty 1
  Filled 2015-08-09 (×2): qty 2

## 2015-08-09 MED ORDER — PROPOFOL 10 MG/ML IV BOLUS
INTRAVENOUS | Status: AC
  Filled 2015-08-09: qty 60

## 2015-08-09 MED ORDER — BUPIVACAINE-EPINEPHRINE (PF) 0.25% -1:200000 IJ SOLN
INTRAMUSCULAR | Status: DC | PRN
  Administered 2015-08-09: 30 mL

## 2015-08-09 MED ORDER — CEFAZOLIN SODIUM-DEXTROSE 2-4 GM/100ML-% IV SOLN
INTRAVENOUS | Status: AC
  Filled 2015-08-09: qty 100

## 2015-08-09 MED ORDER — CEFAZOLIN SODIUM-DEXTROSE 2-4 GM/100ML-% IV SOLN
2.0000 g | Freq: Four times a day (QID) | INTRAVENOUS | Status: AC
  Administered 2015-08-09 – 2015-08-10 (×2): 2 g via INTRAVENOUS
  Filled 2015-08-09 (×2): qty 100

## 2015-08-09 MED ORDER — LACTATED RINGERS IV SOLN
INTRAVENOUS | Status: DC
  Administered 2015-08-09: 1000 mL via INTRAVENOUS

## 2015-08-09 MED ORDER — KETOROLAC TROMETHAMINE 30 MG/ML IJ SOLN
INTRAMUSCULAR | Status: AC
  Filled 2015-08-09: qty 1

## 2015-08-09 MED ORDER — FENTANYL CITRATE (PF) 100 MCG/2ML IJ SOLN
INTRAMUSCULAR | Status: AC
  Filled 2015-08-09: qty 2

## 2015-08-09 MED ORDER — ONDANSETRON HCL 4 MG/2ML IJ SOLN: 4.0000 mg | Freq: Once | INTRAMUSCULAR | Status: DC | PRN

## 2015-08-09 MED ORDER — DIPHENHYDRAMINE HCL 25 MG PO CAPS: 25.0000 mg | ORAL_CAPSULE | Freq: Four times a day (QID) | ORAL | Status: DC | PRN

## 2015-08-09 MED ORDER — ONDANSETRON HCL 4 MG/2ML IJ SOLN: 4.0000 mg | Freq: Four times a day (QID) | INTRAMUSCULAR | Status: DC | PRN

## 2015-08-09 MED ORDER — BISACODYL 10 MG RE SUPP: 10.0000 mg | Freq: Every day | RECTAL | Status: DC | PRN

## 2015-08-09 MED ORDER — FENTANYL CITRATE (PF) 100 MCG/2ML IJ SOLN
25.0000 ug | INTRAMUSCULAR | Status: DC | PRN
  Administered 2015-08-09 (×3): 50 ug via INTRAVENOUS

## 2015-08-09 MED ORDER — LORATADINE 10 MG PO TABS
10.0000 mg | ORAL_TABLET | Freq: Every day | ORAL | Status: DC
  Administered 2015-08-10 – 2015-08-11 (×2): 10 mg via ORAL
  Filled 2015-08-09 (×2): qty 1

## 2015-08-09 MED ORDER — METHOCARBAMOL 500 MG PO TABS
500.0000 mg | ORAL_TABLET | Freq: Four times a day (QID) | ORAL | Status: DC | PRN
  Administered 2015-08-09 – 2015-08-11 (×4): 500 mg via ORAL
  Filled 2015-08-09 (×5): qty 1

## 2015-08-09 MED ORDER — FENTANYL CITRATE (PF) 100 MCG/2ML IJ SOLN
INTRAMUSCULAR | Status: DC | PRN
  Administered 2015-08-09 (×2): 50 ug via INTRAVENOUS
  Administered 2015-08-09: 100 ug via INTRAVENOUS

## 2015-08-09 MED ORDER — KETOROLAC TROMETHAMINE 30 MG/ML IJ SOLN
INTRAMUSCULAR | Status: DC | PRN
Start: 2015-08-09 — End: 2015-08-09
  Administered 2015-08-09: 30 mg

## 2015-08-09 MED ORDER — SODIUM CHLORIDE 0.9 % IJ SOLN
INTRAMUSCULAR | Status: AC
  Filled 2015-08-09: qty 50

## 2015-08-09 MED ORDER — MENTHOL 3 MG MT LOZG: 1.0000 | LOZENGE | OROMUCOSAL | Status: DC | PRN

## 2015-08-09 MED ORDER — SODIUM CHLORIDE 0.9 % IV SOLN
1000.0000 mg | Freq: Once | INTRAVENOUS | Status: AC
  Administered 2015-08-09: 1000 mg via INTRAVENOUS
  Filled 2015-08-09: qty 10

## 2015-08-09 MED ORDER — ASPIRIN 81 MG PO CHEW
81.0000 mg | CHEWABLE_TABLET | Freq: Two times a day (BID) | ORAL | Status: DC
  Administered 2015-08-09 – 2015-08-11 (×4): 81 mg via ORAL
  Filled 2015-08-09 (×4): qty 1

## 2015-08-09 MED ORDER — MAGNESIUM CITRATE PO SOLN: 1.0000 | Freq: Once | ORAL | Status: DC | PRN

## 2015-08-09 MED ORDER — DEXAMETHASONE SODIUM PHOSPHATE 10 MG/ML IJ SOLN
10.0000 mg | Freq: Once | INTRAMUSCULAR | Status: AC
  Administered 2015-08-09: 10 mg via INTRAVENOUS

## 2015-08-09 MED ORDER — PHENOL 1.4 % MT LIQD: 1.0000 | OROMUCOSAL | Status: DC | PRN

## 2015-08-09 MED ORDER — ZOLPIDEM TARTRATE 5 MG PO TABS
5.0000 mg | ORAL_TABLET | Freq: Every evening | ORAL | Status: DC | PRN
  Administered 2015-08-10: 5 mg via ORAL
  Filled 2015-08-09: qty 1

## 2015-08-09 MED ORDER — DEXAMETHASONE SODIUM PHOSPHATE 10 MG/ML IJ SOLN
INTRAMUSCULAR | Status: AC
  Filled 2015-08-09: qty 1

## 2015-08-09 MED ORDER — SODIUM CHLORIDE 0.9 % IV SOLN
INTRAVENOUS | Status: DC
  Administered 2015-08-09: 20:00:00 via INTRAVENOUS
  Filled 2015-08-09 (×7): qty 1000

## 2015-08-09 MED ORDER — ONDANSETRON HCL 4 MG PO TABS: 4.0000 mg | ORAL_TABLET | Freq: Four times a day (QID) | ORAL | Status: DC | PRN

## 2015-08-09 MED ORDER — SODIUM CHLORIDE 0.9 % IR SOLN
Status: DC | PRN
  Administered 2015-08-09: 1000 mL

## 2015-08-09 MED ORDER — 0.9 % SODIUM CHLORIDE (POUR BTL) OPTIME
TOPICAL | Status: DC | PRN
  Administered 2015-08-09: 1000 mL

## 2015-08-09 MED ORDER — ONDANSETRON HCL 4 MG/2ML IJ SOLN
INTRAMUSCULAR | Status: DC | PRN
  Administered 2015-08-09: 4 mg via INTRAVENOUS

## 2015-08-09 MED ORDER — BUPIVACAINE IN DEXTROSE 0.75-8.25 % IT SOLN
INTRATHECAL | Status: DC | PRN
  Administered 2015-08-09: 1.6 mL via INTRATHECAL

## 2015-08-09 MED ORDER — POLYETHYLENE GLYCOL 3350 17 G PO PACK
17.0000 g | PACK | Freq: Two times a day (BID) | ORAL | Status: DC
  Administered 2015-08-09 – 2015-08-11 (×4): 17 g via ORAL
  Filled 2015-08-09 (×4): qty 1

## 2015-08-09 MED ORDER — NORTRIPTYLINE HCL 25 MG PO CAPS
25.0000 mg | ORAL_CAPSULE | Freq: Every day | ORAL | Status: DC
  Administered 2015-08-09 – 2015-08-10 (×2): 25 mg via ORAL
  Filled 2015-08-09 (×2): qty 1

## 2015-08-09 MED ORDER — LORAZEPAM 0.5 MG PO TABS
0.5000 mg | ORAL_TABLET | Freq: Three times a day (TID) | ORAL | Status: DC | PRN
  Administered 2015-08-09: 0.5 mg via ORAL
  Filled 2015-08-09: qty 1

## 2015-08-09 MED ORDER — FERROUS SULFATE 325 (65 FE) MG PO TABS
325.0000 mg | ORAL_TABLET | Freq: Three times a day (TID) | ORAL | Status: DC
  Administered 2015-08-10 – 2015-08-11 (×4): 325 mg via ORAL
  Filled 2015-08-09 (×4): qty 1

## 2015-08-09 MED ORDER — METOCLOPRAMIDE HCL 5 MG PO TABS: 5.0000 mg | ORAL_TABLET | Freq: Three times a day (TID) | ORAL | Status: DC | PRN

## 2015-08-09 MED ORDER — METOCLOPRAMIDE HCL 5 MG/ML IJ SOLN: 5.0000 mg | Freq: Three times a day (TID) | INTRAMUSCULAR | Status: DC | PRN

## 2015-08-09 MED ORDER — DEXAMETHASONE SODIUM PHOSPHATE 10 MG/ML IJ SOLN: 10.0000 mg | Freq: Once | INTRAMUSCULAR | Status: DC

## 2015-08-09 MED ORDER — GABAPENTIN 300 MG PO CAPS
300.0000 mg | ORAL_CAPSULE | Freq: Every day | ORAL | Status: DC
  Administered 2015-08-10 – 2015-08-11 (×2): 300 mg via ORAL
  Filled 2015-08-09 (×2): qty 1

## 2015-08-09 MED ORDER — CELECOXIB 200 MG PO CAPS
200.0000 mg | ORAL_CAPSULE | Freq: Two times a day (BID) | ORAL | Status: DC
  Administered 2015-08-09 – 2015-08-11 (×4): 200 mg via ORAL
  Filled 2015-08-09 (×4): qty 1

## 2015-08-09 MED ORDER — ONDANSETRON HCL 4 MG/2ML IJ SOLN
INTRAMUSCULAR | Status: AC
Start: 2015-08-09 — End: 2015-08-09
  Filled 2015-08-09: qty 2

## 2015-08-09 SURGICAL SUPPLY — 43 items
BAG ZIPLOCK 12X15 (MISCELLANEOUS) IMPLANT
BANDAGE ACE 6X5 VEL STRL LF (GAUZE/BANDAGES/DRESSINGS) ×3 IMPLANT
BLADE SAW SGTL 13.0X1.19X90.0M (BLADE) ×3 IMPLANT
BOWL SMART MIX CTS (DISPOSABLE) ×3 IMPLANT
CAP KNEE TOTAL 3 SIGMA ×3 IMPLANT
CEMENT HV SMART SET (Cement) ×6 IMPLANT
CLOTH BEACON ORANGE TIMEOUT ST (SAFETY) ×3 IMPLANT
CUFF TOURN SGL QUICK 34 (TOURNIQUET CUFF) ×2
CUFF TRNQT CYL 34X4X40X1 (TOURNIQUET CUFF) ×1 IMPLANT
DECANTER SPIKE VIAL GLASS SM (MISCELLANEOUS) ×3 IMPLANT
DRAPE U-SHAPE 47X51 STRL (DRAPES) ×3 IMPLANT
DRESSING AQUACEL AG SP 3.5X10 (GAUZE/BANDAGES/DRESSINGS) ×1 IMPLANT
DRSG AQUACEL AG SP 3.5X10 (GAUZE/BANDAGES/DRESSINGS) ×3
DURAPREP 26ML APPLICATOR (WOUND CARE) ×6 IMPLANT
ELECT REM PT RETURN 9FT ADLT (ELECTROSURGICAL) ×3
ELECTRODE REM PT RTRN 9FT ADLT (ELECTROSURGICAL) ×1 IMPLANT
GLOVE BIOGEL M 7.0 STRL (GLOVE) ×3 IMPLANT
GLOVE BIOGEL PI IND STRL 7.5 (GLOVE) ×1 IMPLANT
GLOVE BIOGEL PI IND STRL 8.5 (GLOVE) IMPLANT
GLOVE BIOGEL PI INDICATOR 7.5 (GLOVE) ×2
GLOVE BIOGEL PI INDICATOR 8.5 (GLOVE)
GLOVE ECLIPSE 8.0 STRL XLNG CF (GLOVE) IMPLANT
GLOVE ORTHO TXT STRL SZ7.5 (GLOVE) ×6 IMPLANT
GOWN STRL REUS W/TWL LRG LVL3 (GOWN DISPOSABLE) ×3 IMPLANT
GOWN STRL REUS W/TWL XL LVL3 (GOWN DISPOSABLE) ×3 IMPLANT
HANDPIECE INTERPULSE COAX TIP (DISPOSABLE) ×2
LIQUID BAND (GAUZE/BANDAGES/DRESSINGS) ×3 IMPLANT
MANIFOLD NEPTUNE II (INSTRUMENTS) ×3 IMPLANT
PACK TOTAL KNEE CUSTOM (KITS) ×3 IMPLANT
POSITIONER SURGICAL ARM (MISCELLANEOUS) ×3 IMPLANT
SET HNDPC FAN SPRY TIP SCT (DISPOSABLE) ×1 IMPLANT
SET PAD KNEE POSITIONER (MISCELLANEOUS) ×3 IMPLANT
SUT MNCRL AB 4-0 PS2 18 (SUTURE) ×3 IMPLANT
SUT VIC AB 1 CT1 36 (SUTURE) ×3 IMPLANT
SUT VIC AB 2-0 CT1 27 (SUTURE) ×6
SUT VIC AB 2-0 CT1 TAPERPNT 27 (SUTURE) ×3 IMPLANT
SUT VLOC 180 0 24IN GS25 (SUTURE) ×3 IMPLANT
SYR 50ML LL SCALE MARK (SYRINGE) ×3 IMPLANT
TRAY FOLEY W/METER SILVER 14FR (SET/KITS/TRAYS/PACK) ×3 IMPLANT
TRAY FOLEY W/METER SILVER 16FR (SET/KITS/TRAYS/PACK) ×3 IMPLANT
WATER STERILE IRR 1500ML POUR (IV SOLUTION) ×3 IMPLANT
WRAP KNEE MAXI GEL POST OP (GAUZE/BANDAGES/DRESSINGS) ×3 IMPLANT
YANKAUER SUCT BULB TIP 10FT TU (MISCELLANEOUS) ×3 IMPLANT

## 2015-08-09 NOTE — Transfer of Care (Signed)
Immediate Anesthesia Transfer of Care Note  Patient: [name]  Procedure(s) Performed: Procedure(s): TOTAL KNEE ARTHROPLASTY (Right)  Patient Location: PACU  Anesthesia Type:Spinal  Level of Consciousness: awake, alert , oriented and patient cooperative  Airway & Oxygen Therapy: Patient Spontanous Breathing and Patient connected to face mask oxygen  Post-op Assessment: Report given to RN and Post -op Vital signs reviewed and stable  Post vital signs: stable  Last Vitals:  Vitals:   08/09/15 1117  BP: 133/85  Pulse: 75  Resp: 16  Temp: 37.3 C    Last Pain:  Vitals:   08/09/15 1117  TempSrc: Oral      Patients Stated Pain Goal: 4 ([id number])  Complications: No apparent anesthesia complications

## 2015-08-09 NOTE — Anesthesia Procedure Notes (Signed)
Spinal  Patient location during procedure: OR End time: 08/09/2015 2:26 PM Staffing Performed: anesthesiologist  Preanesthetic Checklist Completed: patient identified, site marked, surgical consent, pre-op evaluation, timeout performed, IV checked, risks and benefits discussed and monitors and equipment checked Spinal Block Patient position: sitting Prep: Betadine Patient monitoring: heart rate, continuous pulse ox, blood pressure and cardiac monitor Approach: right paramedian Location: L3-4 Injection technique: single-shot Needle Needle type: Spinocan  Needle gauge: 22 G Needle length: 9 cm Assessment Sensory level: T6 Additional Notes Expiration date of kit checked and confirmed. Patient tolerated procedure well, without complications.

## 2015-08-09 NOTE — Anesthesia Postprocedure Evaluation (Signed)
Anesthesia Post Note  Patient: IRLANDA GILLEYLEN  Procedure(s) Performed: Procedure(s) (LRB): TOTAL KNEE ARTHROPLASTY (Right)  Patient location during evaluation: PACU Anesthesia Type: MAC and Spinal Level of consciousness: awake, awake and alert and oriented Pain management: pain level controlled Vital Signs Assessment: post-procedure vital signs reviewed and stable Respiratory status: spontaneous breathing, nonlabored ventilation and respiratory function stable Cardiovascular status: blood pressure returned to baseline Postop Assessment: spinal receding Anesthetic complications: no    Last Vitals:  Vitals:   08/09/15 1650 08/09/15 1700  BP:  119/67  Pulse: 62 62  Resp: 15 13  Temp:      Last Pain:  Vitals:   08/09/15 1700  TempSrc:   PainSc: 4     LLE Motor Response: Responds to commands (08/09/15 1700) LLE Sensation: No numbness;No tingling (08/09/15 1700) RLE Motor Response: Responds to commands (08/09/15 1700) RLE Sensation: No numbness;No tingling (08/09/15 1700) L Sensory Level: S1-Sole of foot, small toes (08/09/15 1700) R Sensory Level: S1-Sole of foot, small toes (08/09/15 1700)  Rico Massar COKER

## 2015-08-09 NOTE — Anesthesia Procedure Notes (Signed)
Performed by: Isaack Preble E       

## 2015-08-09 NOTE — Op Note (Signed)
NAME:  [name]                      MEDICAL RECORD NO.:  [id number]                             FACILITY:  [facility]      PHYSICIAN:  Pietro Cassis. Alvan Dame, M.D.  DATE OF BIRTH:  [birth year]      DATE OF PROCEDURE:  08/09/2015                                     OPERATIVE REPORT         PREOPERATIVE DIAGNOSIS:  Right knee osteoarthritis.      POSTOPERATIVE DIAGNOSIS:  Right knee osteoarthritis.      FINDINGS:  The patient was noted to have complete loss of cartilage and   bone-on-bone arthritis with associated osteophytes in all three compartments of   the knee with a significant synovitis and associated effusion.      PROCEDURE:  Right total knee replacement.      COMPONENTS USED:  De[employer] Sigma rotating platform posterior stabilized knee   system, a size 3 femur, 2.5 tibia, size 10 mm PS insert, and 38 patellar   button.      SURGEON:  Pietro Cassis. Alvan Dame, M.D.      ASSISTANT:  Nehemiah Massed, PA-C.      ANESTHESIA:  General.      SPECIMENS:  None.      COMPLICATION:  None.      DRAINS:  None.  EBL: <50cc      TOURNIQUET TIME:   Total Tourniquet Time Documented: Thigh (Right) - 32 minutes Total: Thigh (Right) - 32 minutes  .      The patient was stable to the recovery room.      INDICATION FOR PROCEDURE:  [name] is a 82 y.o. female patient of   mine.  The patient had been seen, evaluated, and treated conservatively in the   office with medication, activity modification, and injections.  The patient had   radiographic changes of bone-on-bone arthritis with endplate sclerosis and osteophytes noted.      The patient failed conservative measures including medication, injections, and activity modification, and at this point was ready for more definitive measures.   Based on the radiographic changes and failed conservative measures, the patient   decided to proceed with total knee replacement.  Risks of infection,   DVT, component failure, need for revision  surgery, postop course, and   expectations were all   discussed and reviewed.  Consent was obtained for benefit of pain   relief.      PROCEDURE IN DETAIL:  The patient was brought to the operative theater.   Once adequate anesthesia, preoperative antibiotics, 2 gm of Ancef, 1 gm of Tranexamic Acid, and 10 mg of Decadron administered, the patient was positioned supine with the right thigh tourniquet placed.  The  right lower extremity was prepped and draped in sterile fashion.  A time-   out was performed identifying the patient, planned procedure, and   extremity.      The right lower extremity was placed in the [facility] leg holder.  The leg was   exsanguinated, tourniquet elevated to 250 mmHg.  A midline incision was   made followed by median parapatellar  arthrotomy.  Following initial   exposure, attention was first directed to the patella.  Precut   measurement was noted to be 22 mm.  I resected down to 114 mm and used a   38 patellar button to restore patellar height as well as cover the cut   surface.      The lug holes were drilled and a metal shim was placed to protect the   patella from retractors and saw blades.      At this point, attention was now directed to the femur.  The femoral   canal was opened with a drill, irrigated to try to prevent fat emboli.  An   intramedullary rod was passed at 3 degrees valgus, 10 mm of bone was   resected off the distal femur.  Following this resection, the tibia was   subluxated anteriorly.  Using the extramedullary guide, 4 mm of bone was resected off   the proximal medial tibia.  We confirmed the gap would be   stable medially and laterally with a 10 mm insert as well as confirmed   the cut was perpendicular in the coronal plane, checking with an alignment rod.      Once this was done, I sized the femur to be a size 3 in the anterior-   posterior dimension, chose a standard component based on medial and   lateral dimension.  The size 3  rotation block was then pinned in   position anterior referenced using the C-clamp to set rotation.  The   anterior, posterior, and  chamfer cuts were made without difficulty nor   notching making certain that I was along the anterior cortex to help   with flexion gap stability.      The final box cut was made off the lateral aspect of distal femur.      At this point, the tibia was sized to be a size 2.5, the size 2.5 tray was   then pinned in position through the medial third of the tubercle,   drilled, and keel punched.  Trial reduction was now carried with a 3 femur,  2.5 tibia, a size 10 mm insert, and the 38 patella botton.  The knee was brought to   extension, full extension with good flexion stability with the patella   tracking through the trochlea without application of pressure.  Given   all these findings, the trial components removed.  Final components were   opened and cement was mixed.  The knee was irrigated with normal saline   solution and pulse lavage.  The synovial lining was   then injected with 30 cc of 0.25% Marcaine with epinephrine and 1 cc of Toradol plus 30 cc of NS for a total of 61 cc.      The knee was irrigated.  Final implants were then cemented onto clean and   dried cut surfaces of bone with the knee brought to extension with a size 10   mm trial insert.      Once the cement had fully cured, the excess cement was removed   throughout the knee.  I confirmed I was satisfied with the range of   motion and stability, and the final size 10 mm PS insert was chosen.  It was   placed into the knee.      The tourniquet had been let down at 32 minutes.  No significant   hemostasis required.  The   extensor mechanism was then  reapproximated using #1 Vicryl and #0 V-lock sutures with the knee   in flexion.  The   remaining wound was closed with 2-0 Vicryl and running 4-0 Monocryl.   The knee was cleaned, dried, dressed sterilely using Dermabond and   Aquacel  dressing.  The patient was then   brought to recovery room in stable condition, tolerating the procedure   well.   Please note that Physician Assistant, Nehemiah Massed, PA-C, was present for the entirety of the case, and was utilized for pre-operative positioning, peri-operative retractor management, general facilitation of the procedure.  He was also utilized for primary wound closure at the end of the case.              Pietro Cassis Alvan Dame, M.D.    08/09/2015 6:10 PM

## 2015-08-09 NOTE — Anesthesia Preprocedure Evaluation (Signed)
Anesthesia Evaluation  Patient identified by MRN, date of birth, ID band Patient awake    Reviewed: Allergy & Precautions, NPO status , Patient's Chart, lab work & pertinent test results  Airway Mallampati: II  TM Distance: >3 FB Neck ROM: Full    Dental  (+) Teeth Intact, Dental Advisory Given   Pulmonary former smoker,    breath sounds clear to auscultation       Cardiovascular  Rhythm:Regular Rate:Normal     Neuro/Psych    GI/Hepatic   Endo/Other    Renal/GU      Musculoskeletal   Abdominal   Peds  Hematology   Anesthesia Other Findings   Reproductive/Obstetrics                             Anesthesia Physical Anesthesia Plan  ASA: II  Anesthesia Plan: MAC and Spinal   Post-op Pain Management:    Induction: Intravenous  Airway Management Planned: Natural Airway and Simple Face Mask  Additional Equipment:   Intra-op Plan:   Post-operative Plan:   Informed Consent: I have reviewed the patients History and Physical, chart, labs and discussed the procedure including the risks, benefits and alternatives for the proposed anesthesia with the patient or authorized representative who has indicated his/her understanding and acceptance.   Dental advisory given  Plan Discussed with: CRNA and Anesthesiologist  Anesthesia Plan Comments:         Anesthesia Quick Evaluation

## 2015-08-09 NOTE — Interval H&P Note (Signed)
History and Physical Interval Note:  08/09/2015 1:18 PM  [name]  has presented today for surgery, with the diagnosis of RIGHT KNEE OA  The various methods of treatment have been discussed with the patient and family. After consideration of risks, benefits and other options for treatment, the patient has consented to  Procedure(s): TOTAL KNEE ARTHROPLASTY (Right) as a surgical intervention .  The patient's history has been reviewed, patient examined, no change in status, stable for surgery.  I have reviewed the patient's chart and labs.  Questions were answered to the patient's satisfaction.     [name]

## 2015-08-10 ENCOUNTER — Encounter (HOSPITAL_COMMUNITY): Payer: Self-pay | Admitting: Orthopedic Surgery

## 2015-08-10 DIAGNOSIS — E663 Overweight: Secondary | ICD-10-CM | POA: Diagnosis present

## 2015-08-10 LAB — CBC
HEMATOCRIT: 37.8 % (ref 36.0–46.0)
HEMOGLOBIN: 12.7 g/dL (ref 12.0–15.0)
MCH: 30 pg (ref 26.0–34.0)
MCHC: 33.6 g/dL (ref 30.0–36.0)
MCV: 89.2 fL (ref 78.0–100.0)
Platelets: 265 10*3/uL (ref 150–400)
RBC: 4.24 MIL/uL (ref 3.87–5.11)
RDW: 13.7 % (ref 11.5–15.5)
WBC: 15.5 10*3/uL — AB (ref 4.0–10.5)

## 2015-08-10 LAB — BASIC METABOLIC PANEL
ANION GAP: 6 (ref 5–15)
BUN: 10 mg/dL (ref 6–20)
CALCIUM: 8.3 mg/dL — AB (ref 8.9–10.3)
CHLORIDE: 106 mmol/L (ref 101–111)
CO2: 26 mmol/L (ref 22–32)
CREATININE: 0.61 mg/dL (ref 0.44–1.00)
GFR calc non Af Amer: >60 mL/min (ref >60)
Glucose, Bld: 131 mg/dL — ABNORMAL HIGH (ref 65–99)
Potassium: 3.9 mmol/L (ref 3.5–5.1)
SODIUM: 138 mmol/L (ref 135–145)

## 2015-08-10 MED ORDER — POLYETHYLENE GLYCOL 3350 17 G PO PACK: 17.0000 g | PACK | Freq: Two times a day (BID) | ORAL | 0 refills | Status: AC

## 2015-08-10 MED ORDER — TIZANIDINE HCL 4 MG PO TABS: 4.0000 mg | ORAL_TABLET | Freq: Four times a day (QID) | ORAL | 0 refills | Status: DC | PRN

## 2015-08-10 MED ORDER — HYDROCODONE-ACETAMINOPHEN 7.5-325 MG PO TABS
1.0000 | ORAL_TABLET | Freq: Four times a day (QID) | ORAL | Status: DC | PRN
  Administered 2015-08-10: 1 via ORAL
  Filled 2015-08-10: qty 1

## 2015-08-10 MED ORDER — DOCUSATE SODIUM 100 MG PO CAPS: 100.0000 mg | ORAL_CAPSULE | Freq: Two times a day (BID) | ORAL | 0 refills | Status: DC

## 2015-08-10 MED ORDER — HYDROCODONE-ACETAMINOPHEN 7.5-325 MG PO TABS: 1.0000 | ORAL_TABLET | ORAL | 0 refills | Status: DC | PRN

## 2015-08-10 MED ORDER — FERROUS SULFATE 325 (65 FE) MG PO TABS: 325.0000 mg | ORAL_TABLET | Freq: Three times a day (TID) | ORAL | 3 refills | Status: DC

## 2015-08-10 MED ORDER — ASPIRIN 81 MG PO CHEW: 81.0000 mg | CHEWABLE_TABLET | Freq: Two times a day (BID) | ORAL | 0 refills | Status: DC

## 2015-08-10 MED ORDER — ACETAMINOPHEN 325 MG PO TABS
650.0000 mg | ORAL_TABLET | Freq: Four times a day (QID) | ORAL | Status: DC | PRN
  Administered 2015-08-10 – 2015-08-11 (×4): 650 mg via ORAL
  Filled 2015-08-10 (×4): qty 2

## 2015-08-10 NOTE — Progress Notes (Signed)
     Subjective: 1 Day Post-Op Procedure(s) (LRB): TOTAL KNEE ARTHROPLASTY (Right)   Patient reports pain as mild, pain controlled.  No events throughout the night. Discussed current pain patterns and the procedure with Dr. Alvan Dame.    Objective:   VITALS:   Vitals:   08/10/15 0059 08/10/15 0515  BP: 117/64 132/61  Pulse: 60 66  Resp: 16 16  Temp: 97.7 F (36.5 C) 97.5 F (36.4 C)    Dorsiflexion/Plantar flexion intact Incision: dressing C/D/I No cellulitis present Compartment soft  LABS  Recent Labs  08/10/15 0439  HGB 12.7  HCT 37.8  WBC 15.5*  PLT 265     Recent Labs  08/10/15 0439  NA 138  K 3.9  BUN 10  CREATININE 0.61  GLUCOSE 131*     Assessment/Plan: 1 Day Post-Op Procedure(s) (LRB): TOTAL KNEE ARTHROPLASTY (Right) Foley cath d/c'ed Advance diet Up with therapy D/C IV fluids Discharge home with home health, eventually when ready  Overweight (BMI 25-29.9) Estimated body mass index is 27.1 kg/m as calculated from the following:   Height as of this encounter: 5\' 3"  (1.6 m).   Weight as of this encounter: 69.4 kg (153 lb). Patient also counseled that weight may inhibit the healing process Patient counseled that losing weight will help with future health issues       [place] Pugh. Yossi Hinchman   PAC  08/10/2015, 8:04 AM

## 2015-08-10 NOTE — Progress Notes (Signed)
Occupational Therapy Evaluation Patient Details Name: [name] MRN: [id number] DOB: Sep 26, 1929 Today's Date: 08/10/2015    History of Present Illness Pt s/p R TKR with hx of L TKR, Bil RCR, breast ca and neuropathy   Clinical Impression   Patient presents to OT with decreased ADL independence and safety due to the deficits listed below. She will benefit from skilled OT to maximize function and to facilitate a safe discharge. OT will follow.    Follow Up Recommendations  Home health OT;Supervision/Assistance - 24 hour    Equipment Recommendations  None recommended by OT    Recommendations for Other Services       Precautions / Restrictions Precautions Precautions: Knee;Fall Restrictions Weight Bearing Restrictions: No Other Position/Activity Restrictions: WBAT      Mobility Bed Mobility Overal bed mobility: Needs Assistance Bed Mobility: Sit to Supine      Sit to supine: Min assist   General bed mobility comments: Assisted RLE  Transfers Overall transfer level: Needs assistance Equipment used: Rolling walker (2 wheeled) Transfers: Sit to/from Stand Sit to Stand: Min assist         General transfer comment: cues for sequencing and hand placement    Balance                                            ADL Overall ADL's : Needs assistance/impaired Eating/Feeding: Minimal assistance;Bed level   Grooming: Minimal assistance;Bed level           Upper Body Dressing : Maximal assistance;Sitting   Lower Body Dressing: Total assistance;Sit to/from stand   Toilet Transfer: Minimal assistance;Stand-pivot;BSC;RW Armed forces technical officer Details (indicate cue type and reason): simulated from bed Toileting- Clothing Manipulation and Hygiene: Total assistance;Sit to/from stand       Functional mobility during ADLs: Minimal assistance;Rolling walker General ADL Comments: Patient highly confused; husband attributes it to pain medication.  Began ADL education with husband who will be her primary caregiver at discharge. Educated on using 3in 1 in shower if needed and placing it at bedside at night due to patient's history of urinary incontinence. OT will follow.     Vision     Perception     Praxis      Pertinent Vitals/Pain Pain Assessment: Faces Faces Pain Scale: Hurts even more Pain Location: R knee Pain Descriptors / Indicators: Aching;Sore Pain Intervention(s): Limited activity within patient's tolerance;Monitored during session;Repositioned;Patient requesting pain meds-RN notified     Hand Dominance Right   Extremity/Trunk Assessment Upper Extremity Assessment Upper Extremity Assessment: Overall WFL for tasks assessed   Lower Extremity Assessment Lower Extremity Assessment: Defer to PT evaluation        Communication Communication Communication: No difficulties   Cognition Arousal/Alertness: Awake/alert Behavior During Therapy: Impulsive;Restless Overall Cognitive Status: History of cognitive impairments - at baseline       Memory: Decreased short-term memory;Decreased recall of precautions             General Comments       Exercises      Shoulder Instructions      Home Living Family/patient expects to be discharged to:: Private residence Living Arrangements: Spouse/significant other Available Help at Discharge: Family;Available 24 hours/day Type of Home: House Home Access: Stairs to enter [employer] of Steps: 1 Entrance Stairs-Rails: None Home Layout: One level     Bathroom Shower/Tub: Hospital doctor  Toilet: Standard Bathroom Accessibility: Yes How Accessible: Accessible via walker Home Equipment: Cane - single point;Walker - 2 wheels;Bedside commode;Shower seat - built in;Grab bars - tub/shower          Prior Functioning/Environment Level of Independence: Independent        Comments: has urinary incontinence    OT Diagnosis: Acute  pain;Cognitive deficits   OT Problem List: Decreased strength;Decreased range of motion;Decreased activity tolerance;Decreased cognition;Decreased safety awareness;Decreased knowledge of use of DME or AE;Decreased knowledge of precautions;Pain   OT Treatment/Interventions: Self-care/ADL training;DME and/or AE instruction;Therapeutic activities;Patient/family education    OT Goals(Current goals can be found in the care plan section) Acute Rehab OT Goals Patient Stated Goal: Regain IND and ambulate without pain OT Goal Formulation: With patient Time For Goal Achievement: 08/24/15 Potential to Achieve Goals: Good ADL Goals Pt Will Perform Lower Body Bathing: with min assist;sit to/from stand Pt Will Perform Lower Body Dressing: with min assist;sit to/from stand Pt Will Transfer to Toilet: with min guard assist;bedside commode Pt Will Perform Toileting - Clothing Manipulation and hygiene: with min assist;sit to/from stand Pt Will Perform Tub/Shower Transfer: Shower transfer;with min guard assist;3 in 1;rolling walker;grab bars  OT Frequency: Min 2X/week   Barriers to D/C:            Co-evaluation              End of Session Equipment Utilized During Treatment: Surveyor, mining Communication: Patient requests pain meds  Activity Tolerance: Other (comment) Patient left: in bed;with call bell/phone within reach;with family/visitor present   Time: 1350-1420 OT Time Calculation (min): 30 min Charges:  OT General Charges $OT Visit: 1 Procedure OT Evaluation $OT Eval Low Complexity: 1 Procedure OT Treatments $Therapeutic Activity: 8-22 mins G-Codes:    Randee Upchurch A August 14, 2015, 2:33 PM

## 2015-08-10 NOTE — Discharge Instructions (Signed)

## 2015-08-10 NOTE — Care Management Note (Signed)
Case Management Note  Patient Details  Name: [name] MRN: [id number] Date of Birth: [birth year]  Subjective/Objective:                  TOTAL KNEE ARTHROPLASTY (Right)  Action/Plan: Discharge planning Expected Discharge Date:  08/11/15              Expected Discharge Plan:  [place]  In-House Referral:     Discharge planning Services  CM Consult  Post Acute Care Choice:  Home Health Choice offered to:  Patient, Spouse  DME Arranged:  N/A DME Agency:  NA  HH Arranged:  PT [place] Agency:  [facility] (now [facility])  Status of Service:  Completed, signed off  If discussed at [employer] of Stay Meetings, dates discussed:    Additional Comments: Cm met with pt in room to offer choice of home health agency.  Pt has ST memory deficit and spouse chooses [facility] to render HHPT.  Pt has both a rolling walker and a 3n1 at home.  Referral given to [employer], Tim.  No other CM needs were communicated. Dellie Catholic, RN 08/10/2015, 12:05 PM

## 2015-08-10 NOTE — Evaluation (Signed)
Physical Therapy Evaluation Patient Details Name: [name] MRN: [id number] DOB: 26-Apr-1929 Today's Date: 08/10/2015   History of Present Illness  Pt s/p R TKR with hx of L TKR, Bil RCR, breast ca and neuropathy  Clinical Impression  Pt s/p R TKR presents with R LE strength/ROM, post op pain and memory deficits limiting functional mobility.  Pt should progress to dc home with family assist and HHPT follow up.   Follow Up Recommendations Home health PT    Equipment Recommendations  None recommended by PT    Recommendations for Other Services OT consult     Precautions / Restrictions Precautions Precautions: Knee;Fall Restrictions Weight Bearing Restrictions: No Other Position/Activity Restrictions: WBAT      Mobility  Bed Mobility Overal bed mobility: Needs Assistance Bed Mobility: Supine to Sit     Supine to sit: Min assist     General bed mobility comments: cues for sequence and use of L LE to self assist  Transfers Overall transfer level: Needs assistance Equipment used: Rolling walker (2 wheeled) Transfers: Sit to/from Stand Sit to Stand: Min assist;Mod assist         General transfer comment: cues for LE management and use of UEs to self assist  Ambulation/Gait Ambulation/Gait assistance: Min assist;Mod assist;+2 safety/equipment Ambulation Distance (Feet): 22 Feet Assistive device: Rolling walker (2 wheeled) Gait Pattern/deviations: Step-to pattern;Decreased step length - right;Decreased step length - left;Shuffle;Trunk flexed Gait velocity: decr Gait velocity interpretation: Below normal speed for age/gender General Gait Details: cues for sequence, posture and position from [employer]            Wheelchair Mobility    Modified Rankin (Stroke Patients Only)       Balance                                             Pertinent Vitals/Pain Pain Assessment: Faces Faces Pain Scale: Hurts even more Pain Location:  R knee Pain Descriptors / Indicators: Aching;Sore Pain Intervention(s): Limited activity within patient's tolerance;Monitored during session;Premedicated before session;Ice applied    Home Living Family/patient expects to be discharged to:: Private residence Living Arrangements: Spouse/significant other Available Help at Discharge: Family;Available 24 hours/day Type of Home: House Home Access: Stairs to enter Entrance Stairs-Rails: None Entrance Stairs-Number of Steps: 1 Home Layout: One level Home Equipment: Cane - single point;Walker - 2 wheels      Prior Function Level of Independence: Independent               Hand Dominance   Dominant Hand: Right    Extremity/Trunk Assessment   Upper Extremity Assessment: Overall WFL for tasks assessed           Lower Extremity Assessment: RLE deficits/detail RLE Deficits / Details: 3/5 quads with IND SLR and AAROM at knee -10- 90       Communication   Communication: No difficulties  Cognition Arousal/Alertness: Awake/alert Behavior During Therapy: WFL for tasks assessed/performed Overall Cognitive Status: History of cognitive impairments - at baseline       Memory: Decreased short-term memory;Decreased recall of precautions              General Comments      Exercises Total Joint Exercises Ankle Circles/Pumps: AROM;Both;15 reps;Supine Quad Sets: AROM;Both;Supine;10 reps Heel Slides: AAROM;Right;15 reps;Supine Hip ABduction/ADduction: AAROM;AROM;Right;15 reps;Supine      Assessment/Plan    PT  Assessment Patient needs continued PT services  PT Diagnosis Difficulty walking   PT Problem List Decreased strength;Decreased range of motion;Decreased activity tolerance;Decreased mobility;Decreased knowledge of use of DME;Pain;Decreased knowledge of precautions;Decreased cognition;Decreased safety awareness  PT Treatment Interventions DME instruction;Gait training;Stair training;Therapeutic activities;Functional  mobility training;Therapeutic exercise;Patient/family education   PT Goals (Current goals can be found in the Care Plan section) Acute Rehab PT Goals Patient Stated Goal: Regain IND and ambulate without pain PT Goal Formulation: With patient Time For Goal Achievement: 08/15/15 Potential to Achieve Goals: Good    Frequency 7X/week   Barriers to discharge        Co-evaluation               End of Session Equipment Utilized During Treatment: Gait belt Activity Tolerance: Patient limited by fatigue;Patient limited by pain;Other (comment) (nausea) Patient left: in chair;with call bell/phone within reach;with family/visitor present Nurse Communication: Mobility status         Time: [id number] PT Time Calculation (min) (ACUTE ONLY): 38 min   Charges:   PT Evaluation $PT Eval Low Complexity: 1 Procedure PT Treatments $Gait Training: 8-22 mins $Therapeutic Exercise: 8-22 mins   PT G Codes:        Spenser Harren 31-Aug-2015, 1:06 PM

## 2015-08-10 NOTE — Progress Notes (Signed)
Physical Therapy Treatment Patient Details Name: [name] MRN: [id number] DOB: [birth year] Today's Date: 08/10/2015    History of Present Illness Pt s/p R TKR with hx of L TKR, Bil RCR, breast ca and neuropathy    PT Comments    Pt continues impulsive and with difficulty focusing on task but progressing with mobility and denies dizziness this session.  Follow Up Recommendations  Home health PT     Equipment Recommendations  None recommended by PT    Recommendations for Other Services OT consult     Precautions / Restrictions Precautions Precautions: Knee;Fall Restrictions Weight Bearing Restrictions: No Other Position/Activity Restrictions: WBAT    Mobility  Bed Mobility Overal bed mobility: Needs Assistance Bed Mobility: Supine to Sit;Sit to Supine     Supine to sit: Min guard Sit to supine: Min guard   General bed mobility comments: cues for sequence and safe management of R LE  Transfers Overall transfer level: Needs assistance Equipment used: Rolling walker (2 wheeled) Transfers: Sit to/from Stand Sit to Stand: Min guard         General transfer comment: cues for LE management and hand placement  Ambulation/Gait Ambulation/Gait assistance: Min assist;Min guard Ambulation Distance (Feet): 118 Feet Assistive device: Rolling walker (2 wheeled) Gait Pattern/deviations: Step-to pattern;Decreased step length - right;Decreased step length - left;Shuffle;Trunk flexed     General Gait Details: cues for sequence, posture and position from [employer]            Wheelchair Mobility    Modified Rankin (Stroke Patients Only)       Balance                                    Cognition Arousal/Alertness: Awake/alert Behavior During Therapy: Impulsive;Restless Overall Cognitive Status: Impaired/Different from baseline       Memory: Decreased short-term memory;Decreased recall of precautions              Exercises  Total Joint Exercises Ankle Circles/Pumps: AROM;Both;15 reps;Supine    General Comments        Pertinent Vitals/Pain Pain Assessment: Faces Faces Pain Scale: Hurts little more Pain Location: R knee Pain Descriptors / Indicators: Aching;Sore Pain Intervention(s): Limited activity within patient's tolerance;Monitored during session;Premedicated before session;Ice applied    Home Living Family/patient expects to be discharged to:: Private residence Living Arrangements: Spouse/significant other Available Help at Discharge: Family;Available 24 hours/day Type of Home: House Home Access: Stairs to enter Entrance Stairs-Rails: None Home Layout: One level Home Equipment: Cane - single point;Walker - 2 wheels;Bedside commode;Shower seat - built in;Grab bars - tub/shower      Prior Function Level of Independence: Independent      Comments: has urinary incontinence   PT Goals (current goals can now be found in the care plan section) Acute Rehab PT Goals Patient Stated Goal: Regain IND and ambulate without pain PT Goal Formulation: With patient Time For Goal Achievement: 08/15/15 Potential to Achieve Goals: Good Progress towards PT goals: Progressing toward goals    Frequency  7X/week    PT Plan Current plan remains appropriate    Co-evaluation             End of Session Equipment Utilized During Treatment: Gait belt Activity Tolerance: Patient tolerated treatment well Patient left: in bed;with call bell/phone within reach;with family/visitor present     Time: 1557-1620 PT Time Calculation (min) (ACUTE ONLY): 23 min  Charges:  $Gait Training: 23-37 mins                    G Codes:      Joshiah Traynham 2015-08-24, 4:51 PM

## 2015-08-11 LAB — CBC
HCT: 35.4 % — ABNORMAL LOW (ref 36.0–46.0)
HEMOGLOBIN: 11.9 g/dL — AB (ref 12.0–15.0)
MCH: 30.1 pg (ref 26.0–34.0)
MCHC: 33.6 g/dL (ref 30.0–36.0)
MCV: 89.4 fL (ref 78.0–100.0)
Platelets: 231 10*3/uL (ref 150–400)
RBC: 3.96 MIL/uL (ref 3.87–5.11)
RDW: 13.9 % (ref 11.5–15.5)
WBC: 8.8 10*3/uL (ref 4.0–10.5)

## 2015-08-11 LAB — BASIC METABOLIC PANEL
ANION GAP: 5 (ref 5–15)
BUN: 14 mg/dL (ref 6–20)
CHLORIDE: 108 mmol/L (ref 101–111)
CO2: 27 mmol/L (ref 22–32)
Calcium: 8.2 mg/dL — ABNORMAL LOW (ref 8.9–10.3)
Creatinine, Ser: 0.79 mg/dL (ref 0.44–1.00)
GFR calc non Af Amer: >60 mL/min (ref >60)
Glucose, Bld: 98 mg/dL (ref 65–99)
POTASSIUM: 3.7 mmol/L (ref 3.5–5.1)
SODIUM: 140 mmol/L (ref 135–145)

## 2015-08-11 MED ORDER — TRAMADOL HCL 50 MG PO TABS: 50.0000 mg | ORAL_TABLET | Freq: Four times a day (QID) | ORAL | 4 refills | Status: DC | PRN

## 2015-08-11 MED ORDER — ACETAMINOPHEN 325 MG PO TABS: 650.0000 mg | ORAL_TABLET | Freq: Four times a day (QID) | ORAL | Status: AC | PRN

## 2015-08-11 MED ORDER — TRAMADOL HCL 50 MG PO TABS
50.0000 mg | ORAL_TABLET | Freq: Four times a day (QID) | ORAL | Status: DC
  Administered 2015-08-11: 100 mg via ORAL
  Filled 2015-08-11: qty 2
  Filled 2015-08-11: qty 1

## 2015-08-11 MED ORDER — ASPIRIN 81 MG PO CHEW: 81.0000 mg | CHEWABLE_TABLET | Freq: Two times a day (BID) | ORAL | 0 refills | Status: DC

## 2015-08-11 MED ORDER — TRAMADOL HCL 50 MG PO TABS: 50.0000 mg | ORAL_TABLET | Freq: Four times a day (QID) | ORAL | 0 refills | Status: AC | PRN

## 2015-08-11 MED ORDER — ASPIRIN 81 MG PO CHEW: 81.0000 mg | CHEWABLE_TABLET | Freq: Two times a day (BID) | ORAL | 0 refills | Status: AC

## 2015-08-11 MED ORDER — TRAMADOL HCL 50 MG PO TABS
50.0000 mg | ORAL_TABLET | Freq: Four times a day (QID) | ORAL | Status: DC
  Administered 2015-08-11: 100 mg via ORAL
  Filled 2015-08-11: qty 1

## 2015-08-11 NOTE — Progress Notes (Signed)
Physical Therapy Treatment Patient Details Name: [name] MRN: [id number] DOB: October 03, 1929 Today's Date: 08/11/2015    History of Present Illness Pt s/p R TKR with hx of L TKR, Bil RCR, breast ca and neuropathy    PT Comments    POD # 2 am session Progressing slowly with issues of pain control and cognition.  Assisted with amb and gait training.  Mild unsteady esp with turns.    Follow Up Recommendations  Home health PT     Equipment Recommendations  None recommended by PT    Recommendations for Other Services       Precautions / Restrictions Precautions Precautions: Knee;Fall Restrictions Weight Bearing Restrictions: No Other Position/Activity Restrictions: WBAT    Mobility  Bed Mobility Overal bed mobility: Needs Assistance Bed Mobility: Supine to Sit;Sit to Supine     Supine to sit: Min guard Sit to supine: Min guard   General bed mobility comments: Pt OOB in recliner  Transfers Overall transfer level: Needs assistance Equipment used: Rolling walker (2 wheeled) Transfers: Sit to/from Stand Sit to Stand: Min guard;Min assist         General transfer comment: cues for LE management and hand placement  Ambulation/Gait Ambulation/Gait assistance: Min guard;Supervision Ambulation Distance (Feet): 84 Feet Assistive device: Rolling walker (2 wheeled) Gait Pattern/deviations: Step-to pattern     General Gait Details: cues for sequence, posture and position from RW pluys increased time   Stairs            Wheelchair Mobility    Modified Rankin (Stroke Patients Only)       Balance                                    Cognition Arousal/Alertness: Awake/alert Behavior During Therapy: WFL for tasks assessed/performed Overall Cognitive Status: Within Functional Limits for tasks assessed       Memory: Decreased short-term memory;Decreased recall of precautions              Exercises      General Comments        Pertinent Vitals/Pain Pain Assessment: 0-10 Pain Score: 5  Faces Pain Scale: Hurts even more Pain Location: R knee Pain Descriptors / Indicators: Grimacing;Sore Pain Intervention(s): Monitored during session;Repositioned;Ice applied    Home Living                      Prior Function            PT Goals (current goals can now be found in the care plan section) Acute Rehab PT Goals Patient Stated Goal: Regain IND and ambulate without pain Progress towards PT goals: Progressing toward goals    Frequency  7X/week    PT Plan Current plan remains appropriate    Co-evaluation             End of Session Equipment Utilized During Treatment: Gait belt Activity Tolerance: Patient tolerated treatment well Patient left: with call bell/phone within reach;with chair alarm set;with family/visitor present     Time: [id number] PT Time Calculation (min) (ACUTE ONLY): 26 min  Charges:  $Gait Training: 8-22 mins $Therapeutic Activity: 8-22 mins                    G Codes:      Rica Koyanagi  PTA [facility]  Acute  Rehab Pager      [phone]

## 2015-08-11 NOTE — Progress Notes (Signed)
RN reviewed discharge education with patient and family. All questions answered.   Paperwork and prescriptions given.   NT rolled patient down with all belongings to family car.

## 2015-08-11 NOTE — Progress Notes (Signed)
Physical Therapy Treatment Patient Details Name: DRAKE CRANMER MRN: [id number] DOB: 1929-11-22 Today's Date: 08/11/2015    History of Present Illness Pt s/p R TKR with hx of L TKR, Bil RCR, breast ca and neuropathy    PT Comments    POD # 2 pm session Assisted with amb and practiced one step with spouse.  Assisted back to bed to perform TKR TE's following HEP handout.  Instructed on proper tech and freq as well as use of ICE.   Pt ready for D/C to home.   Follow Up Recommendations  Home health PT     Equipment Recommendations  None recommended by PT    Recommendations for Other Services       Precautions / Restrictions Precautions Precautions: Knee;Fall Restrictions Weight Bearing Restrictions: No Other Position/Activity Restrictions: WBAT    Mobility  Bed Mobility Overal bed mobility: Needs Assistance Bed Mobility: Supine to Sit;Sit to Supine     Supine to sit: Min guard Sit to supine: Min guard   General bed mobility comments: Pt OOB in recliner  Transfers Overall transfer level: Needs assistance Equipment used: Rolling walker (2 wheeled) Transfers: Sit to/from Stand Sit to Stand: Min guard;Min assist         General transfer comment: cues for LE management and hand placement  Ambulation/Gait Ambulation/Gait assistance: Min guard;Supervision Ambulation Distance (Feet): 84 Feet Assistive device: Rolling walker (2 wheeled) Gait Pattern/deviations: Step-to pattern     General Gait Details: cues for sequence, posture and position from RW pluys increased time   Stairs    one step forward with walker with spouse with 50% VC's on proper tech and walker placement        Wheelchair Mobility    Modified Rankin (Stroke Patients Only)       Balance                                    Cognition Arousal/Alertness: Awake/alert Behavior During Therapy: WFL for tasks assessed/performed Overall Cognitive Status: Within Functional  Limits for tasks assessed       Memory: Decreased short-term memory;Decreased recall of precautions              Exercises   Total Knee Replacement TE's 10 reps B LE ankle pumps 10 reps towel squeezes 10 reps knee presses 10 reps heel slides  10 reps SAQ's 10 reps SLR's 10 reps ABD Followed by ICE     General Comments        Pertinent Vitals/Pain Pain Assessment: 0-10 Pain Score: 5  Faces Pain Scale: Hurts even more Pain Location: R knee Pain Descriptors / Indicators: Grimacing;Sore Pain Intervention(s): Monitored during session;Repositioned;Ice applied    Home Living                      Prior Function            PT Goals (current goals can now be found in the care plan section) Acute Rehab PT Goals Patient Stated Goal: Regain IND and ambulate without pain Progress towards PT goals: Progressing toward goals    Frequency  7X/week    PT Plan Current plan remains appropriate    Co-evaluation             End of Session Equipment Utilized During Treatment: Gait belt Activity Tolerance: Patient tolerated treatment well Patient left: with call bell/phone within reach;with chair alarm set;with  family/visitor present     Time: [id number] PT Time Calculation (min) (ACUTE ONLY): 24 min  Charges:  $Gait Training: 8-22 mins $Therapeutic Exercise: 8-22 mins                    G Codes:      Rica Koyanagi  PTA [facility]  Acute  Rehab Pager      [phone]

## 2015-08-11 NOTE — Progress Notes (Signed)
     Subjective: 2 Days Post-Op Procedure(s) (LRB): TOTAL KNEE ARTHROPLASTY (Right)   Patient reports pain as moderate, pain not fully controlled on APAP alone. Patient's pain was not controlled fully on Tylenol  Alone last night, patient was given a dose of hydrocodone. Patient was somewhat clear prior to receiving hydrocodone, after receiving hydrocodone she did go back into a state of slight confusion.  A discussion with both the patient's husband and daughter this morning with regards to analgesic medications and how it affects the patient. Along with the patient we have decided to have the patient scheduled Tylenol and use tramadol to help assist in the pain management.  Patient family are in agreement with this plan. I'll change the patient to the tramadol and Tylenol plan this morning and see how she does throughout the day. If patient does well with physical therapy and her mind is clear she may be discharged home later today.  Objective:   VITALS:   Vitals:   08/10/15 2106 08/11/15 0511  BP: (!) 153/69 (!) 116/92  Pulse: 66 77  Resp: 16 16  Temp: 98 F (36.7 C) 98.1 F (36.7 C)    Dorsiflexion/Plantar flexion intact Incision: dressing C/D/I No cellulitis present Compartment soft  LABS  Recent Labs  08/10/15 0439 08/11/15 0456  HGB 12.7 11.9*  HCT 37.8 35.4*  WBC 15.5* 8.8  PLT 265 231     Recent Labs  08/10/15 0439 08/11/15 0456  NA 138 140  K 3.9 3.7  BUN 10 14  CREATININE 0.61 0.79  GLUCOSE 131* 98     Assessment/Plan: 2 Days Post-Op Procedure(s) (LRB): TOTAL KNEE ARTHROPLASTY (Right) Up with therapy Discharge home with home health  Follow up in 2 weeks at [facility]. Follow up with OLIN,Alessio Bogan D in 2 weeks.  Contact information:  [facility] [address], [address] [place]   PAC  08/11/2015, 9:47 AM

## 2015-08-11 NOTE — Progress Notes (Signed)
Occupational Therapy Treatment Patient Details Name: YITTEL EMRICH MRN: [id number] DOB: July 01, 1929 Today's Date: 08/11/2015    History of present illness Pt s/p R TKR with hx of L TKR, Bil RCR, breast ca and neuropathy   OT comments  All OT education completed and pt/spouse questions answered. Patient requires assistance with all ADL/ADL transfers at this time due to continued decreased cognitive status. Plan is for discharge home with family later today.   Follow Up Recommendations  No OT follow up;Supervision/Assistance - 24 hour    Equipment Recommendations  None recommended by OT    Recommendations for Other Services      Precautions / Restrictions Precautions Precautions: Knee;Fall Restrictions Weight Bearing Restrictions: No Other Position/Activity Restrictions: WBAT       Mobility Bed Mobility Overal bed mobility: Needs Assistance Bed Mobility: Supine to Sit;Sit to Supine     Supine to sit: Min guard Sit to supine: Min guard   General bed mobility comments: cues for sequence and safe management of R LE  Transfers Overall transfer level: Needs assistance Equipment used: Rolling walker (2 wheeled) Transfers: Sit to/from Stand Sit to Stand: Min guard;Min assist         General transfer comment: cues for LE management and hand placement    Balance                                   ADL Overall ADL's : Needs assistance/impaired Eating/Feeding: Independent;Sitting   Grooming: Set up;Sitting           Upper Body Dressing : Minimal assistance;Sitting   Lower Body Dressing: Minimal assistance;Sit to/from stand   Toilet Transfer: Min guard;Minimal assistance;Ambulation;BSC;RW   Toileting- Clothing Manipulation and Hygiene: Min guard;Minimal assistance;Sit to/from stand   Tub/ Shower Transfer: Walk-in shower;Minimal assistance;Cueing for safety;Cueing for sequencing;Ambulation;Rolling walker;3 in 1   Functional mobility during  ADLs: Min guard;Minimal assistance;Rolling walker General ADL Comments: Patient remains confused, needs assistance with all mobility/ADL at this time. Husband present and supportive. Patient practiced toilet and shower transfers, then dressing into personal clothing. Plans to go home this afternoon.       Vision                     Perception     Praxis      Cognition   Behavior During Therapy: Impulsive;Restless Overall Cognitive Status: Impaired/Different from baseline       Memory: Decreased short-term memory;Decreased recall of precautions               Extremity/Trunk Assessment               Exercises     Shoulder Instructions       General Comments      Pertinent Vitals/ Pain       Pain Assessment: Faces Faces Pain Scale: Hurts even more Pain Location: R knee Pain Descriptors / Indicators: Aching;Sore;Grimacing Pain Intervention(s): Limited activity within patient's tolerance;Monitored during session;Patient requesting pain meds-RN notified;Ice applied  Home Living                                          Prior Functioning/Environment              Frequency       Progress Toward Goals  OT Goals(current  goals can now be found in the care plan section)  Progress towards OT goals: Goals met/education completed, patient discharged from OT  Acute Rehab OT Goals Patient Stated Goal: Regain IND and ambulate without pain OT Goal Formulation: All assessment and education complete, DC therapy  Plan Discharge plan needs to be updated    Co-evaluation                 End of Session Equipment Utilized During Treatment: Rolling walker   Activity Tolerance Patient tolerated treatment well   Patient Left in bed;with call bell/phone within reach;with family/visitor present;with nursing/sitter in room   Nurse Communication Patient requests pain meds;Mobility status        Time: 9741-6384 OT Time Calculation  (min): 31 min  Charges: OT General Charges $OT Visit: 1 Procedure OT Treatments $Self Care/Home Management : 23-37 mins  Jaiden Dinkins A 08/11/2015, 1:50 PM

## 2015-08-12 DIAGNOSIS — F419 Anxiety disorder, unspecified: Secondary | ICD-10-CM | POA: Diagnosis not present

## 2015-08-12 DIAGNOSIS — M1990 Unspecified osteoarthritis, unspecified site: Secondary | ICD-10-CM | POA: Diagnosis not present

## 2015-08-12 DIAGNOSIS — Z471 Aftercare following joint replacement surgery: Secondary | ICD-10-CM | POA: Diagnosis not present

## 2015-08-12 DIAGNOSIS — F329 Major depressive disorder, single episode, unspecified: Secondary | ICD-10-CM | POA: Diagnosis not present

## 2015-08-12 DIAGNOSIS — R42 Dizziness and giddiness: Secondary | ICD-10-CM | POA: Diagnosis not present

## 2015-08-12 DIAGNOSIS — G609 Hereditary and idiopathic neuropathy, unspecified: Secondary | ICD-10-CM | POA: Diagnosis not present

## 2015-08-15 DIAGNOSIS — Z471 Aftercare following joint replacement surgery: Secondary | ICD-10-CM | POA: Diagnosis not present

## 2015-08-15 DIAGNOSIS — M1990 Unspecified osteoarthritis, unspecified site: Secondary | ICD-10-CM | POA: Diagnosis not present

## 2015-08-15 DIAGNOSIS — R42 Dizziness and giddiness: Secondary | ICD-10-CM | POA: Diagnosis not present

## 2015-08-15 DIAGNOSIS — F329 Major depressive disorder, single episode, unspecified: Secondary | ICD-10-CM | POA: Diagnosis not present

## 2015-08-15 DIAGNOSIS — G609 Hereditary and idiopathic neuropathy, unspecified: Secondary | ICD-10-CM | POA: Diagnosis not present

## 2015-08-15 DIAGNOSIS — F419 Anxiety disorder, unspecified: Secondary | ICD-10-CM | POA: Diagnosis not present

## 2015-08-15 NOTE — Discharge Summary (Signed)
Physician Discharge Summary  Patient ID: MARABETH LEMIRE MRN: [id number] DOB/AGE: 1929-04-17 82 y.o.  Admit date: 08/09/2015 Discharge date: 08/11/2015   Procedures:  Procedure(s) (LRB): TOTAL KNEE ARTHROPLASTY (Right)  Attending Physician:  Dr. Paralee Cancel   Admission Diagnoses:   Right knee primary OA /pain  Discharge Diagnoses:  Principal Problem:   S/P right TKA Active Problems:   S/P knee replacement   Overweight (BMI 25.0-29.9)  Past Medical History:  Diagnosis Date  . Allergic rhinitis   . Anxiety   . Arthritis   . Breast cancer ([place])    left breast cancer  . Complication of anesthesia    memory loss  . Concussion 2015   fell and hit head  . Depression    situational depression  . Diverticulosis   . GERD (gastroesophageal reflux disease)   . Headache(784.0)    sinus HAs  . Hyperlipidemia   . Neuromuscular disorder ([place])    neuropathy- nerve damage in neck causes nerve damage in toes    HPI:    CALEENA RUSHLOW, 82 y.o. female, has a history of pain and functional disability in the right knee due to arthritis and has failed non-surgical conservative treatments for greater than 12 weeks to include NSAID's and/or analgesics, corticosteriod injections, use of assistive devices and activity modification.  Onset of symptoms was gradual, starting 2+ years ago with gradually worsening course since that time. The patient noted prior procedures on the knee to include  arthroplasty on the left knee per Dr. Wynelle Link.  Patient currently rates pain in the right knee(s) at 9 out of 10 with activity. Patient has worsening of pain with activity and weight bearing, pain that interferes with activities of daily living, pain with passive range of motion, crepitus and joint swelling.  Patient has evidence of periarticular osteophytes and joint space narrowing by imaging studies.  There is no active infection.   Risks, benefits and expectations were discussed with the patient.   Risks including but not limited to the risk of anesthesia, blood clots, nerve damage, blood vessel damage, failure of the prosthesis, infection and up to and including death.  Patient understand the risks, benefits and expectations and wishes to proceed with surgery.   PCP: Tivis Ringer, MD   Discharged Condition: good  Hospital Course:  Patient underwent the above stated procedure on 08/09/2015. Patient tolerated the procedure well and brought to the recovery room in good condition and subsequently to the floor.  POD #1 BP: 132/61 ; Pulse: 66 ; Temp: 97.5 F (36.4 C) ; Resp: 16 Patient reports pain as mild, pain controlled.  No events throughout the night. Discussed current pain patterns and the procedure with Dr. Alvan Dame. Dorsiflexion/plantar flexion intact, incision: dressing C/D/I, no cellulitis present and compartment soft.   LABS  Basename    HGB     12.7  HCT     37.8   POD #2  BP: 116/92 ; Pulse: 77 ; Temp: 98.1 F (36.7 C) ; Resp: 16 Patient reports pain as moderate, pain not fully controlled on APAP alone. Patient's pain was not controlled fully on Tylenol  Alone last night, patient was given a dose of hydrocodone. Patient was somewhat clear prior to receiving hydrocodone, after receiving hydrocodone she did go back into a state of slight confusion.  A discussion with both the patient's husband and daughter this morning with regards to analgesic medications and how it affects the patient. Along with the patient we have decided to have  the patient scheduled Tylenol and use tramadol to help assist in the pain management.  Patient family are in agreement with this plan. I'll change the patient to the tramadol and Tylenol plan this morning and see how she does throughout the day. If patient does well with physical therapy and her mind is clear she may be discharged home later today. Dorsiflexion/plantar flexion intact, incision: dressing C/D/I, no cellulitis present and compartment soft.    LABS  Basename    HGB     11.9  HCT     35.4    Discharge Exam: General appearance: alert, cooperative and no distress Extremities: Homans sign is negative, no sign of DVT, no edema, redness or tenderness in the calves or thighs and no ulcers, gangrene or trophic changes  Disposition: Home with follow up in 2 weeks   Follow-up Information    [name], MD. Schedule an appointment as soon as possible for a visit in 2 week(s).   Specialty:  Orthopedic Surgery Contact information: [address] [place] [address] [phone]        [facility] .   Why:  home health physical therapy Contact information: [place] [address] [phone]           Discharge Instructions    Call MD / Call 911    Complete by:  As directed   If you experience chest pain or shortness of breath, CALL 911 and be transported to the hospital emergency room.  If you develope a fever above 101 F, pus (white drainage) or increased drainage or redness at the wound, or calf pain, call your surgeon's office.   Change dressing    Complete by:  As directed   Maintain surgical dressing until follow up in the clinic. If the edges start to pull up, may reinforce with tape. If the dressing is no longer working, may remove and cover with gauze and tape, but must keep the area dry and clean.  Call with any questions or concerns.   Constipation Prevention    Complete by:  As directed   Drink plenty of fluids.  Prune juice may be helpful.  You may use a stool softener, such as Colace (over the counter) 100 mg twice a day.  Use MiraLax (over the counter) for constipation as needed.   Diet - low sodium heart healthy    Complete by:  As directed   Discharge instructions    Complete by:  As directed   Maintain surgical dressing until follow up in the clinic. If the edges start to pull up, may reinforce with tape. If the dressing is no longer working, may  remove and cover with gauze and tape, but must keep the area dry and clean.  Follow up in 2 weeks at [facility] Call with any questions or concerns.   Increase activity slowly as tolerated    Complete by:  As directed   Weight bearing as tolerated with assist device (walker, cane, etc) as directed, use it as long as suggested by your surgeon or therapist, typically at least 4-6 weeks.   TED hose    Complete by:  As directed   Use stockings (TED hose) for 2 weeks on both leg(s).  You may remove them at night for sleeping.        Medication List    STOP taking these medications   aspirin EC 81 MG tablet Replaced by:  aspirin 81  MG chewable tablet     TAKE these medications   acetaminophen 325 MG tablet Commonly known as:  TYLENOL Take 2 tablets (650 mg total) by mouth every 6 (six) hours as needed for mild pain, moderate pain or fever. What changed:  reasons to take this   aspirin 81 MG chewable tablet Chew 1 tablet (81 mg total) by mouth 2 (two) times daily. Take for 4 weeks, then resume regular dose. Replaces:  aspirin EC 81 MG tablet   CALCIUM 600 + D PO Take 600 mg by mouth 2 (two) times daily.   Cholecalciferol 2000 units Tabs Take 2,000 Units by mouth at bedtime.   docusate sodium 100 MG capsule Commonly known as:  COLACE Take 1 capsule (100 mg total) by mouth 2 (two) times daily.   esomeprazole 20 MG capsule Commonly known as:  NEXIUM Take 20 mg by mouth every evening.   ferrous sulfate 325 (65 FE) MG tablet Take 1 tablet (325 mg total) by mouth 3 (three) times daily after meals.   fexofenadine 180 MG tablet Commonly known as:  ALLEGRA Take 180 mg by mouth every other day.   Fish Oil 1200 MG Caps Take 1,200 mg by mouth 2 (two) times daily.   gabapentin 300 MG capsule Commonly known as:  NEURONTIN Take 300 mg by mouth daily.   HYDROcodone-acetaminophen 7.5-325 MG tablet Commonly known as:  NORCO Take 1-2 tablets by mouth every 4 (four) hours  as needed for moderate pain.   LORazepam 0.5 MG tablet Commonly known as:  ATIVAN Take 0.5 mg by mouth every 8 (eight) hours as needed for anxiety.   multivitamin with minerals Tabs tablet Take 1 tablet by mouth daily.   nortriptyline 25 MG capsule Commonly known as:  PAMELOR Take 25 mg by mouth at bedtime.   polyethylene glycol packet Commonly known as:  MIRALAX / GLYCOLAX Take 17 g by mouth 2 (two) times daily.   raloxifene 60 MG tablet Commonly known as:  EVISTA Take 60 mg by mouth daily.   tiZANidine 4 MG tablet Commonly known as:  ZANAFLEX Take 1 tablet (4 mg total) by mouth every 6 (six) hours as needed for muscle spasms.   traMADol 50 MG tablet Commonly known as:  ULTRAM Take 1-2 tablets (50-100 mg total) by mouth every 6 (six) hours as needed. What changed:  how much to take  when to take this  reasons to take this   vitamin B-12 1000 MCG tablet Commonly known as:  CYANOCOBALAMIN Take 1,000 mcg by mouth daily.   zolpidem 10 MG tablet Commonly known as:  AMBIEN Take 3.33 mg by mouth at bedtime as needed for sleep. 1/3 tablet        Signed: West Pugh. Kedron Uno   PA-C  08/15/2015, 12:02 PM

## 2015-08-17 DIAGNOSIS — F329 Major depressive disorder, single episode, unspecified: Secondary | ICD-10-CM | POA: Diagnosis not present

## 2015-08-17 DIAGNOSIS — M1990 Unspecified osteoarthritis, unspecified site: Secondary | ICD-10-CM | POA: Diagnosis not present

## 2015-08-17 DIAGNOSIS — F419 Anxiety disorder, unspecified: Secondary | ICD-10-CM | POA: Diagnosis not present

## 2015-08-17 DIAGNOSIS — Z471 Aftercare following joint replacement surgery: Secondary | ICD-10-CM | POA: Diagnosis not present

## 2015-08-17 DIAGNOSIS — G609 Hereditary and idiopathic neuropathy, unspecified: Secondary | ICD-10-CM | POA: Diagnosis not present

## 2015-08-17 DIAGNOSIS — R42 Dizziness and giddiness: Secondary | ICD-10-CM | POA: Diagnosis not present

## 2015-08-19 DIAGNOSIS — F419 Anxiety disorder, unspecified: Secondary | ICD-10-CM | POA: Diagnosis not present

## 2015-08-19 DIAGNOSIS — Z471 Aftercare following joint replacement surgery: Secondary | ICD-10-CM | POA: Diagnosis not present

## 2015-08-19 DIAGNOSIS — F329 Major depressive disorder, single episode, unspecified: Secondary | ICD-10-CM | POA: Diagnosis not present

## 2015-08-19 DIAGNOSIS — G609 Hereditary and idiopathic neuropathy, unspecified: Secondary | ICD-10-CM | POA: Diagnosis not present

## 2015-08-19 DIAGNOSIS — R42 Dizziness and giddiness: Secondary | ICD-10-CM | POA: Diagnosis not present

## 2015-08-19 DIAGNOSIS — M1990 Unspecified osteoarthritis, unspecified site: Secondary | ICD-10-CM | POA: Diagnosis not present

## 2015-08-20 ENCOUNTER — Encounter (HOSPITAL_COMMUNITY): Payer: Self-pay

## 2015-08-20 ENCOUNTER — Observation Stay (HOSPITAL_COMMUNITY)
Admission: EM | Admit: 2015-08-20 | Discharge: 2015-08-22 | Disposition: A | Discharge status: Home Health | Payer: Commercial Managed Care - HMO | Attending: General Surgery | Admitting: General Surgery

## 2015-08-20 ENCOUNTER — Emergency Department (HOSPITAL_COMMUNITY): Payer: Commercial Managed Care - HMO

## 2015-08-20 DIAGNOSIS — K219 Gastro-esophageal reflux disease without esophagitis: Secondary | ICD-10-CM | POA: Diagnosis present

## 2015-08-20 DIAGNOSIS — Z96651 Presence of right artificial knee joint: Secondary | ICD-10-CM | POA: Diagnosis not present

## 2015-08-20 DIAGNOSIS — S62109A Fracture of unspecified carpal bone, unspecified wrist, initial encounter for closed fracture: Secondary | ICD-10-CM

## 2015-08-20 DIAGNOSIS — Y92009 Unspecified place in unspecified non-institutional (private) residence as the place of occurrence of the external cause: Secondary | ICD-10-CM | POA: Insufficient documentation

## 2015-08-20 DIAGNOSIS — W07XXXA Fall from chair, initial encounter: Secondary | ICD-10-CM | POA: Diagnosis not present

## 2015-08-20 DIAGNOSIS — D72829 Elevated white blood cell count, unspecified: Secondary | ICD-10-CM | POA: Diagnosis not present

## 2015-08-20 DIAGNOSIS — S52531B Colles' fracture of right radius, initial encounter for open fracture type I or II: Secondary | ICD-10-CM | POA: Diagnosis not present

## 2015-08-20 DIAGNOSIS — S6991XA Unspecified injury of right wrist, hand and finger(s), initial encounter: Secondary | ICD-10-CM | POA: Diagnosis present

## 2015-08-20 DIAGNOSIS — G4700 Insomnia, unspecified: Secondary | ICD-10-CM | POA: Diagnosis not present

## 2015-08-20 DIAGNOSIS — Z9012 Acquired absence of left breast and nipple: Secondary | ICD-10-CM | POA: Insufficient documentation

## 2015-08-20 DIAGNOSIS — F419 Anxiety disorder, unspecified: Secondary | ICD-10-CM | POA: Insufficient documentation

## 2015-08-20 DIAGNOSIS — S60811A Abrasion of right wrist, initial encounter: Secondary | ICD-10-CM | POA: Diagnosis not present

## 2015-08-20 DIAGNOSIS — F329 Major depressive disorder, single episode, unspecified: Secondary | ICD-10-CM | POA: Diagnosis not present

## 2015-08-20 DIAGNOSIS — Z7982 Long term (current) use of aspirin: Secondary | ICD-10-CM | POA: Diagnosis not present

## 2015-08-20 DIAGNOSIS — Z6827 Body mass index (BMI) 27.0-27.9, adult: Secondary | ICD-10-CM | POA: Insufficient documentation

## 2015-08-20 DIAGNOSIS — E785 Hyperlipidemia, unspecified: Secondary | ICD-10-CM | POA: Insufficient documentation

## 2015-08-20 DIAGNOSIS — S5290XA Unspecified fracture of unspecified forearm, initial encounter for closed fracture: Secondary | ICD-10-CM | POA: Diagnosis present

## 2015-08-20 DIAGNOSIS — S52611A Displaced fracture of right ulna styloid process, initial encounter for closed fracture: Secondary | ICD-10-CM | POA: Diagnosis not present

## 2015-08-20 DIAGNOSIS — Z01818 Encounter for other preprocedural examination: Secondary | ICD-10-CM | POA: Diagnosis not present

## 2015-08-20 DIAGNOSIS — Z87891 Personal history of nicotine dependence: Secondary | ICD-10-CM | POA: Diagnosis not present

## 2015-08-20 DIAGNOSIS — S5291XA Unspecified fracture of right forearm, initial encounter for closed fracture: Secondary | ICD-10-CM

## 2015-08-20 DIAGNOSIS — Z853 Personal history of malignant neoplasm of breast: Secondary | ICD-10-CM | POA: Diagnosis not present

## 2015-08-20 DIAGNOSIS — S61531A Puncture wound without foreign body of right wrist, initial encounter: Secondary | ICD-10-CM | POA: Diagnosis not present

## 2015-08-20 DIAGNOSIS — S52501A Unspecified fracture of the lower end of right radius, initial encounter for closed fracture: Secondary | ICD-10-CM | POA: Diagnosis not present

## 2015-08-20 DIAGNOSIS — Z79899 Other long term (current) drug therapy: Secondary | ICD-10-CM | POA: Diagnosis not present

## 2015-08-20 DIAGNOSIS — E663 Overweight: Secondary | ICD-10-CM | POA: Diagnosis not present

## 2015-08-20 DIAGNOSIS — S52511A Displaced fracture of right radial styloid process, initial encounter for closed fracture: Secondary | ICD-10-CM | POA: Diagnosis not present

## 2015-08-20 LAB — CBC WITH DIFFERENTIAL/PLATELET
Basophils Absolute: 0 10*3/uL (ref 0.0–0.1)
Basophils Relative: 0 %
EOS ABS: 0.1 10*3/uL (ref 0.0–0.7)
EOS PCT: 1 %
HCT: 38.7 % (ref 36.0–46.0)
Hemoglobin: 12.4 g/dL (ref 12.0–15.0)
LYMPHS ABS: 1.2 10*3/uL (ref 0.7–4.0)
Lymphocytes Relative: 10 %
MCH: 29.6 pg (ref 26.0–34.0)
MCHC: 32 g/dL (ref 30.0–36.0)
MCV: 92.4 fL (ref 78.0–100.0)
MONO ABS: 0.9 10*3/uL (ref 0.1–1.0)
MONOS PCT: 7 %
Neutro Abs: 9.6 10*3/uL — ABNORMAL HIGH (ref 1.7–7.7)
Neutrophils Relative %: 82 %
PLATELETS: 326 10*3/uL (ref 150–400)
RBC: 4.19 MIL/uL (ref 3.87–5.11)
RDW: 14.5 % (ref 11.5–15.5)
WBC: 11.9 10*3/uL — ABNORMAL HIGH (ref 4.0–10.5)

## 2015-08-20 LAB — BASIC METABOLIC PANEL
Anion gap: 7 (ref 5–15)
BUN: 14 mg/dL (ref 6–20)
CHLORIDE: 103 mmol/L (ref 101–111)
CO2: 26 mmol/L (ref 22–32)
CREATININE: 0.83 mg/dL (ref 0.44–1.00)
Calcium: 8.9 mg/dL (ref 8.9–10.3)
GFR calc Af Amer: >60 mL/min (ref >60)
GFR calc non Af Amer: >60 mL/min (ref >60)
Glucose, Bld: 115 mg/dL — ABNORMAL HIGH (ref 65–99)
Potassium: 4.3 mmol/L (ref 3.5–5.1)
SODIUM: 136 mmol/L (ref 135–145)

## 2015-08-20 MED ORDER — PROPOFOL 10 MG/ML IV BOLUS
0.5000 mg/kg | Freq: Once | INTRAVENOUS | Status: AC
Start: 2015-08-20 — End: 2015-08-20
  Administered 2015-08-20: 25 mg via INTRAVENOUS
  Filled 2015-08-20: qty 20

## 2015-08-20 MED ORDER — SODIUM CHLORIDE 0.9 % IV BOLUS (SEPSIS)
500.0000 mL | Freq: Once | INTRAVENOUS | Status: AC
  Administered 2015-08-20: 500 mL via INTRAVENOUS

## 2015-08-20 MED ORDER — CEFAZOLIN SODIUM-DEXTROSE 2-3 GM-% IV SOLR
2.0000 g | INTRAVENOUS | Status: AC
  Administered 2015-08-21: 2 g via INTRAVENOUS

## 2015-08-20 MED ORDER — KETAMINE HCL-SODIUM CHLORIDE 100-0.9 MG/10ML-% IV SOSY
1.0000 mg/kg | PREFILLED_SYRINGE | Freq: Once | INTRAVENOUS | Status: AC
  Administered 2015-08-20: 25 mg via INTRAVENOUS
  Filled 2015-08-20: qty 10

## 2015-08-20 MED ORDER — OXYCODONE-ACETAMINOPHEN 5-325 MG PO TABS
1.0000 | ORAL_TABLET | Freq: Once | ORAL | Status: AC
  Administered 2015-08-20: 1 via ORAL
  Filled 2015-08-20: qty 1

## 2015-08-20 MED ORDER — SODIUM CHLORIDE 0.9 % IV SOLN
INTRAVENOUS | Status: AC | PRN
  Administered 2015-08-20: 1000 mL via INTRAVENOUS

## 2015-08-20 MED ORDER — TETANUS-DIPHTH-ACELL PERTUSSIS 5-2.5-18.5 LF-MCG/0.5 IM SUSP
0.5000 mL | Freq: Once | INTRAMUSCULAR | Status: AC
  Administered 2015-08-20: 0.5 mL via INTRAMUSCULAR
  Filled 2015-08-20: qty 0.5

## 2015-08-20 MED ORDER — KETAMINE HCL 10 MG/ML IJ SOLN
INTRAMUSCULAR | Status: AC | PRN
  Administered 2015-08-20: 25 mg via INTRAVENOUS

## 2015-08-20 MED ORDER — OXYCODONE-ACETAMINOPHEN 5-325 MG PO TABS
ORAL_TABLET | ORAL | Status: AC
  Filled 2015-08-20: qty 1

## 2015-08-20 MED ORDER — OXYCODONE-ACETAMINOPHEN 5-325 MG PO TABS: 1.0000 | ORAL_TABLET | ORAL | Status: DC | PRN

## 2015-08-20 MED ORDER — ETOMIDATE 2 MG/ML IV SOLN
INTRAVENOUS | Status: AC | PRN
  Administered 2015-08-20: 25 mg via INTRAVENOUS

## 2015-08-20 MED ORDER — ONDANSETRON 4 MG PO TBDP
8.0000 mg | ORAL_TABLET | Freq: Once | ORAL | Status: AC
  Administered 2015-08-20: 8 mg via ORAL
  Filled 2015-08-20: qty 2

## 2015-08-20 NOTE — Sedation Documentation (Signed)
Dr Landry Mellow performing reduction of right wrist.

## 2015-08-20 NOTE — H&P (Signed)
Reason for Consult:broken wrist Referring Physician: ER  CC:I fell  HPI:  [name] is an 82 y.o. right handed female who presents with fall onto outstretched R wrist, trying to sit in a chair and chair rolled out from her       .   Pain is rated at    6/10 and is described as sharp.  Pain is constant.  Pain is made better by rest/immobilization, worse with motion.   Associated signs/symptoms: Previous treatment:  TKA ~10 days ago R - Dr Alvan Dame  Past Medical History:  Diagnosis Date  . Allergic rhinitis   . Anxiety   . Arthritis   . Breast cancer ([place])    left breast cancer  . Complication of anesthesia    memory loss  . Concussion 2015   fell and hit head  . Depression    situational depression  . Diverticulosis   . GERD (gastroesophageal reflux disease)   . Headache(784.0)    sinus HAs  . Hyperlipidemia   . Neuromuscular disorder ([facility])    neuropathy- nerve damage in neck causes nerve damage in toes    Past Surgical History:  Procedure Laterality Date  . ABDOMINAL HYSTERECTOMY    . CHOLECYSTECTOMY N/A 03/04/2015   Procedure: LAPAROSCOPIC CHOLECYSTECTOMY WITH INTRAOPERATIVE CHOLANGIOGRAM;  Surgeon: Greer Pickerel, MD;  Location: [facility];  Service: General;  Laterality: N/A;  . ESOPHAGOGASTRODUODENOSCOPY N/A 08/15/2012   Procedure: ESOPHAGOGASTRODUODENOSCOPY (EGD);  Surgeon: Inda Castle, MD;  Location: [facility];  Service: Endoscopy;  Laterality: N/A;  . MASTECTOMY     left  . NASAL SINUS SURGERY     2004  . REPLACEMENT TOTAL KNEE     left  . REVERSE SHOULDER ARTHROPLASTY Left 11/11/2014   Procedure: LEFT REVERSE SHOULDER ARTHROPLASTY;  Surgeon: Justice Britain, MD;  Location: [facility];  Service: Orthopedics;  Laterality: Left;  . SHOULDER SURGERY Bilateral    repairs, rot cuff repair  . STAPEDECTOMY Left    left ear  . TOTAL KNEE ARTHROPLASTY     right  . TOTAL KNEE ARTHROPLASTY Right 08/09/2015   Procedure: TOTAL KNEE ARTHROPLASTY;  Surgeon: Paralee Cancel, MD;   Location: [facility];  Service: Orthopedics;  Laterality: Right;    Family History  Problem Relation Age of Onset  . Colon cancer Father 80  . Breast cancer Mother   . Leukemia Son   . Pancreatic cancer Sister     Social History:  reports that she quit smoking about 57 years ago. Her smoking use included Cigarettes. She has a 3.00 pack-year smoking history. She has never used smokeless tobacco. She reports that she does not drink alcohol or use drugs.  Allergies:  Allergies  Allergen Reactions  . Dust Mite Extract Other (See Comments)    Sinus drainage  . Other Other (See Comments)    Smoke, Cat Dander cause sinus drainage    Medications: I have reviewed the patient's current medications.  No results found for this or any previous visit (from the past 48 hour(s)).  Dg Wrist Complete Right  Result Date: 08/20/2015 CLINICAL DATA:  Pain after trauma EXAM: RIGHT WRIST - COMPLETE 3+ VIEW COMPARISON:  None FINDINGS: There is a displaced fracture of the distal radius. There may also be a displaced ulnar styloid fracture as a normal ulnar styloid is not seen. No wrist dislocation is identified. No other acute abnormalities are identified. IMPRESSION: Displaced fracture of the distal radius. Possible displaced ulnar styloid fracture has well. Electronically Signed  By: Dorise Bullion III M.D   On: 08/20/2015 18:19    Pertinent items are noted in HPI. Temp:  [97.9 F (36.6 C)-98.6 F (37 C)] 98.6 F (37 C) (08/05 1842) Pulse Rate:  [61-81] 74 (08/05 2125) Resp:  [16-22] 19 (08/05 2125) BP: (111-156)/(74-82) 145/79 (08/05 2125) SpO2:  [97 %-100 %] 99 % (08/05 2125) Weight:  [69.9 kg (154 lb)] 69.9 kg (154 lb) (08/05 1726) General appearance: alert and cooperative Resp: clear to auscultation bilaterally Cardio: regular rate and rhythm GI: soft, non-tender; bowel sounds normal; no masses,  no organomegaly Extremities: R wrist with obvious deformity, n/v intact   Assessment: Right  distal radius and ulnar styloid fractures, displaced, comminuted Plan: Will sedate and reduce in ER I have discussed this treatment plan in detail with patient and family, including the risks of the recommended treatment or surgery, the benefits and the alternatives.  The patient and/or caregiver understands that additional treatment may be necessary.  Cortny Bambach CHRISTOPHER 08/20/2015, 9:29 PM

## 2015-08-20 NOTE — ED Triage Notes (Signed)
Pt is s/p right TKR.   Onset today pt was trying to sit down on a rolling chair, chair slipped out from under her and she fell backwards hitting right wrist on floor.  No injury to right knee.

## 2015-08-20 NOTE — Progress Notes (Signed)
Orthopedic Tech Progress Note Patient Details:  [name] [birth year] [id number]  Ortho Devices Type of Ortho Device: Ace wrap, Arm sling, Sugartong splint Ortho Device/Splint Location: RUE Ortho Device/Splint Interventions: Ordered, Application   Braulio Bosch 08/20/2015, 9:43 PM

## 2015-08-20 NOTE — Progress Notes (Signed)
S:s/p closed reduction of R wrist  O:Blood pressure 145/79, pulse 74, temperature 98.6 F (37 C), temperature source Oral, resp. rate 19, height 5\' 3"  (1.6 m), weight 69.9 kg (154 lb), SpO2 99 %.  XR - decent reduction on both AP/Lat views   A:s/p reduction of R distal radius / ulnar styloid   P: recommend admission, ORIF of R wrist in AM, then pt will need to be re-evaluated for home PT. She will need platform walker.. NPO after midnight

## 2015-08-20 NOTE — Sedation Documentation (Signed)
Repeat XR being performed at this time

## 2015-08-20 NOTE — ED Provider Notes (Signed)
[facility] DEPT Provider Note   CSN: [id number] Arrival date & time: 08/20/15  1704  First Provider Contact:  First MD Initiated Contact with Patient 08/20/15 2005        History   Chief Complaint Chief Complaint  Patient presents with  . Wrist Injury    HPI [name] is a 82 y.o. female.  She injured her right wrist, when she was trying to sit on a chair that ruled out beneath her. She fell backwards onto her buttocks. She tried to brace her fall with her right hand. She is able to get up with assistance. No other injuries. He is recovering from a right knee total replacement, about 10 days ago. History fracture. Right wrist 2 years ago. She denies headache, neck pain, back pain, nausea, vomiting preceding weakness or dizziness. There are no other known modifying factors.  HPI  Past Medical History:  Diagnosis Date  . Allergic rhinitis   . Anxiety   . Arthritis   . Breast cancer ([place])    left breast cancer  . Complication of anesthesia    memory loss  . Concussion 2015   fell and hit head  . Depression    situational depression  . Diverticulosis   . GERD (gastroesophageal reflux disease)   . Headache(784.0)    sinus HAs  . Hyperlipidemia   . Neuromuscular disorder (HCC)    neuropathy- nerve damage in neck causes nerve damage in toes    Patient Active Problem List   Diagnosis Date Noted  . Overweight (BMI 25.0-29.9) 08/10/2015  . S/P right TKA 08/09/2015  . S/P knee replacement 08/09/2015  . S/p reverse total shoulder arthroplasty 11/11/2014  . Vertigo 09/10/2013  . Nausea alone 08/15/2012  . Duodenal ulcer 08/15/2012  . Personal history of colonic polyps 12/11/2011  . ASTHMATIC BRONCHITIS, ACUTE 03/31/2007  . ALLERGIC RHINITIS 03/31/2007  . ESOPHAGEAL REFLUX 03/31/2007    Past Surgical History:  Procedure Laterality Date  . ABDOMINAL HYSTERECTOMY    . CHOLECYSTECTOMY N/A 03/04/2015   Procedure: LAPAROSCOPIC CHOLECYSTECTOMY WITH  INTRAOPERATIVE CHOLANGIOGRAM;  Surgeon: Greer Pickerel, MD;  Location: [facility];  Service: General;  Laterality: N/A;  . ESOPHAGOGASTRODUODENOSCOPY N/A 08/15/2012   Procedure: ESOPHAGOGASTRODUODENOSCOPY (EGD);  Surgeon: Inda Castle, MD;  Location: [facility];  Service: Endoscopy;  Laterality: N/A;  . MASTECTOMY     left  . NASAL SINUS SURGERY     2004  . REPLACEMENT TOTAL KNEE     left  . REVERSE SHOULDER ARTHROPLASTY Left 11/11/2014   Procedure: LEFT REVERSE SHOULDER ARTHROPLASTY;  Surgeon: Justice Britain, MD;  Location: [facility];  Service: Orthopedics;  Laterality: Left;  . SHOULDER SURGERY Bilateral    repairs, rot cuff repair  . STAPEDECTOMY Left    left ear  . TOTAL KNEE ARTHROPLASTY     right  . TOTAL KNEE ARTHROPLASTY Right 08/09/2015   Procedure: TOTAL KNEE ARTHROPLASTY;  Surgeon: Paralee Cancel, MD;  Location: [facility];  Service: Orthopedics;  Laterality: Right;    OB History    No data available       Home Medications    Prior to Admission medications   Medication Sig Start Date End Date Taking? Authorizing Provider  acetaminophen (TYLENOL) 325 MG tablet Take 2 tablets (650 mg total) by mouth every 6 (six) hours as needed for mild pain, moderate pain or fever. 08/11/15   Danae Orleans, PA-C  aspirin 81 MG chewable tablet Chew 1 tablet (81 mg total) by mouth 2 (two)  times daily. Take for 4 weeks, then resume regular dose. 08/11/15 09/10/15  Danae Orleans, PA-C  Calcium Carb-Cholecalciferol (CALCIUM 600 + D PO) Take 600 mg by mouth 2 (two) times daily.    Historical Provider, MD  Cholecalciferol 2000 UNITS TABS Take 2,000 Units by mouth at bedtime.    Historical Provider, MD  docusate sodium (COLACE) 100 MG capsule Take 1 capsule (100 mg total) by mouth 2 (two) times daily. 08/10/15   Danae Orleans, PA-C  esomeprazole (NEXIUM) 20 MG capsule Take 20 mg by mouth every evening.    Historical Provider, MD  ferrous sulfate 325 (65 FE) MG tablet Take 1 tablet (325 mg total) by mouth 3  (three) times daily after meals. 08/10/15   Danae Orleans, PA-C  fexofenadine (ALLEGRA) 180 MG tablet Take 180 mg by mouth every other day.     Historical Provider, MD  gabapentin (NEURONTIN) 300 MG capsule Take 300 mg by mouth daily. 04/30/15   Historical Provider, MD  HYDROcodone-acetaminophen (NORCO) 7.5-325 MG tablet Take 1-2 tablets by mouth every 4 (four) hours as needed for moderate pain. 08/10/15   Danae Orleans, PA-C  LORazepam (ATIVAN) 0.5 MG tablet Take 0.5 mg by mouth every 8 (eight) hours as needed for anxiety.    Historical Provider, MD  Multiple Vitamin (MULTIVITAMIN WITH MINERALS) TABS tablet Take 1 tablet by mouth daily.    Historical Provider, MD  nortriptyline (PAMELOR) 25 MG capsule Take 25 mg by mouth at bedtime.    Historical Provider, MD  Omega-3 Fatty Acids (FISH OIL) 1200 MG CAPS Take 1,200 mg by mouth 2 (two) times daily.    Historical Provider, MD  polyethylene glycol (MIRALAX / GLYCOLAX) packet Take 17 g by mouth 2 (two) times daily. 08/10/15   Danae Orleans, PA-C  raloxifene (EVISTA) 60 MG tablet Take 60 mg by mouth daily.    Historical Provider, MD  tiZANidine (ZANAFLEX) 4 MG tablet Take 1 tablet (4 mg total) by mouth every 6 (six) hours as needed for muscle spasms. 08/10/15   Danae Orleans, PA-C  traMADol (ULTRAM) 50 MG tablet Take 1-2 tablets (50-100 mg total) by mouth every 6 (six) hours as needed. 08/11/15   Danae Orleans, PA-C  vitamin B-12 (CYANOCOBALAMIN) 1000 MCG tablet Take 1,000 mcg by mouth daily.    Historical Provider, MD  zolpidem (AMBIEN) 10 MG tablet Take 3.33 mg by mouth at bedtime as needed for sleep. 1/3 tablet    Historical Provider, MD    Family History Family History  Problem Relation Age of Onset  . Colon cancer Father 19  . Breast cancer Mother   . Leukemia Son   . Pancreatic cancer Sister     Social History Social History  Substance Use Topics  . Smoking status: Former Smoker    Packs/day: 0.50    Years: 6.00    Types: Cigarettes     Quit date: 01/15/1958  . Smokeless tobacco: Never Used  . Alcohol use No     Allergies   Dust mite extract and Other   Review of Systems Review of Systems  All other systems reviewed and are negative.    Physical Exam Updated Vital Signs BP 145/79 (BP Location: Left Arm)   Pulse 74   Temp 98.6 F (37 C) (Oral)   Resp 19   Ht 5\' 3"  (1.6 m)   Wt 154 lb (69.9 kg)   SpO2 99%   BMI 27.28 kg/m   Physical Exam  Constitutional: She is oriented to person,  place, and time. She appears well-developed. She appears distressed (She is uncomfortable).  Elderly, frail  HENT:  Head: Normocephalic and atraumatic.  Eyes: Conjunctivae and EOM are normal. Pupils are equal, round, and reactive to light.  Neck: Normal range of motion and phonation normal. Neck supple.  Cardiovascular: Normal rate and regular rhythm.   Pulmonary/Chest: Effort normal and breath sounds normal. She exhibits no tenderness.  Musculoskeletal:  Deformity right wrist, with some preserved extension and flexion but limited by pain. Normal range of motion. Fingers or hand. Superficial abrasion over the ulnar wrist, but does not appear to be deep. No tenderness. Right shoulder or right elbow.  Neurological: She is alert and oriented to person, place, and time. She exhibits normal muscle tone.  Skin: Skin is warm and dry.  Psychiatric: She has a normal mood and affect. Her behavior is normal. Judgment and thought content normal.  Nursing note and vitals reviewed.    ED Treatments / Results  Labs (all labs ordered are listed, but only abnormal results are displayed) Labs Reviewed  BASIC METABOLIC PANEL - Abnormal; Notable for the following:       Result Value   Glucose, Bld 115 (*)    All other components within normal limits  CBC WITH DIFFERENTIAL/PLATELET - Abnormal; Notable for the following:    WBC 11.9 (*)    Neutro Abs 9.6 (*)    All other components within normal limits    EKG  EKG  Interpretation None       Radiology Dg Wrist Complete Right  Result Date: 08/20/2015 CLINICAL DATA:  Pain after trauma EXAM: RIGHT WRIST - COMPLETE 3+ VIEW COMPARISON:  None FINDINGS: There is a displaced fracture of the distal radius. There may also be a displaced ulnar styloid fracture as a normal ulnar styloid is not seen. No wrist dislocation is identified. No other acute abnormalities are identified. IMPRESSION: Displaced fracture of the distal radius. Possible displaced ulnar styloid fracture has well. Electronically Signed   By: Dorise Bullion III M.D   On: 08/20/2015 18:19    Procedures .Sedation Date/Time: 08/20/2015 9:21 PM Performed by: Daleen Bo Authorized by: Daleen Bo   Consent:    Consent obtained:  Verbal and written   Consent given by:  Patient   Risks discussed:  Inadequate sedation, nausea, prolonged hypoxia resulting in organ damage and respiratory compromise necessitating ventilatory assistance and intubation   Alternatives discussed:  Analgesia without sedation Indications:    Procedure performed:  Fracture reduction   Procedure necessitating sedation performed by:  Different physician   Intended level of sedation:  Deep Pre-sedation assessment:    Time since last food or drink:  8 hours   ASA classification: class 2 - patient with mild systemic disease     Neck mobility: normal     Mouth opening:  2 finger widths   Mallampati score:  II - soft palate, uvula, fauces visible   Pre-sedation assessments completed and reviewed: airway patency, cardiovascular function, hydration status, mental status and pain level     Pre-sedation assessments completed and reviewed: nausea/vomiting not reviewed     History of difficult intubation: no     Pre-sedation assessment completed:  08/20/2015 9:02 PM Immediate pre-procedure details:    Reassessment: Patient reassessed immediately prior to procedure     Reviewed: vital signs     Verified: bag valve mask  available, emergency equipment available, intubation equipment available, IV patency confirmed and oxygen available   Procedure details (see MAR for  exact dosages):    Sedation start time:  08/20/2015 9:04 PM   Preoxygenation:  Nasal cannula   Sedation:  Ketamine (And propofol)   Intra-procedure monitoring:  Blood pressure monitoring, continuous capnometry, continuous pulse oximetry and cardiac monitor   Intra-procedure events: none     Sedation end time:  08/20/2015 9:24 PM   Total sedation time (minutes):  20 Post-procedure details:    Post-sedation assessment completed:  08/20/2015 9:26 PM   Attendance: Constant attendance by certified staff until patient recovered     Recovery: Patient returned to pre-procedure baseline     Estimated blood loss (see I/O flowsheets): no     Post-sedation assessments completed and reviewed: airway patency, cardiovascular function, hydration status and mental status     Post-sedation assessments completed and reviewed: nausea/vomiting not reviewed and pain level not reviewed     Patient is stable for discharge or admission: yes     Patient tolerance:  Tolerated well, no immediate complications Comments:     Reduction completed by Dr. Lenon Curt, hand surgeon. He assisted the orthopedic technician with splinting.    (including critical care time)  Medications Ordered in ED Medications  oxyCODONE-acetaminophen (PERCOCET/ROXICET) 5-325 MG per tablet 1 tablet (not administered)  oxyCODONE-acetaminophen (PERCOCET/ROXICET) 5-325 MG per tablet (not administered)  sodium chloride 0.9 % bolus 500 mL (not administered)  ceFAZolin (ANCEF) IVPB 2 g/50 mL premix (not administered)  Tdap (BOOSTRIX) injection 0.5 mL (0.5 mLs Intramuscular Given 08/20/15 2017)  ondansetron (ZOFRAN-ODT) disintegrating tablet 8 mg (8 mg Oral Given 08/20/15 2017)  oxyCODONE-acetaminophen (PERCOCET/ROXICET) 5-325 MG per tablet 1 tablet (1 tablet Oral Given 08/20/15 2017)  propofol (DIPRIVAN) 10 mg/mL  bolus/IV push 35 mg (25 mg Intravenous Given 08/20/15 2114)  ketamine 100 mg in normal saline 10 mL (10mg /mL) syringe (25 mg Intravenous Given 08/20/15 2115)  0.9 %  sodium chloride infusion (1,000 mLs Intravenous New Bag/Given 08/20/15 2114)  ketamine (KETALAR) injection (25 mg Intravenous Given 08/20/15 2115)  etomidate (AMIDATE) injection (25 mg Intravenous Given 08/20/15 2114)     Initial Impression / Assessment and Plan / ED Course  I have reviewed the triage vital signs and the nursing notes.  Pertinent labs & imaging results that were available during my care of the patient were reviewed by me and considered in my medical decision making (see chart for details).  Clinical Course    Medications  oxyCODONE-acetaminophen (PERCOCET/ROXICET) 5-325 MG per tablet 1 tablet (not administered)  oxyCODONE-acetaminophen (PERCOCET/ROXICET) 5-325 MG per tablet (not administered)  sodium chloride 0.9 % bolus 500 mL (not administered)  ceFAZolin (ANCEF) IVPB 2 g/50 mL premix (not administered)  Tdap (BOOSTRIX) injection 0.5 mL (0.5 mLs Intramuscular Given 08/20/15 2017)  ondansetron (ZOFRAN-ODT) disintegrating tablet 8 mg (8 mg Oral Given 08/20/15 2017)  oxyCODONE-acetaminophen (PERCOCET/ROXICET) 5-325 MG per tablet 1 tablet (1 tablet Oral Given 08/20/15 2017)  propofol (DIPRIVAN) 10 mg/mL bolus/IV push 35 mg (25 mg Intravenous Given 08/20/15 2114)  ketamine 100 mg in normal saline 10 mL (10mg /mL) syringe (25 mg Intravenous Given 08/20/15 2115)  0.9 %  sodium chloride infusion (1,000 mLs Intravenous New Bag/Given 08/20/15 2114)  ketamine (KETALAR) injection (25 mg Intravenous Given 08/20/15 2115)  etomidate (AMIDATE) injection (25 mg Intravenous Given 08/20/15 2114)    Patient Vitals for the past 24 hrs:  BP Temp Temp src Pulse Resp SpO2 Height Weight  08/20/15 2125 145/79 - - 74 19 99 % - -  08/20/15 2125 137/77 - - 79 17 99 % - -  08/20/15 2120 145/79 - - 81 22 98 % - -  08/20/15 2116 156/82 - - 68 18 97 % - -   08/20/15 2108 150/77 - - 79 16 100 % - -  08/20/15 1842 127/78 98.6 F (37 C) Oral 61 16 99 % - -  08/20/15 1726 111/74 97.9 F (36.6 C) Oral 74 18 97 % 5\' 3"  (1.6 m) 154 lb (69.9 kg)    20:05- requested, page to hand surgery  9:02 PM Reevaluation with update and discussion. After initial assessment and treatment, an updated evaluation reveals She is more comfortable at this time. Presedation evaluation completed. Kayceon Oki L    9:24 PM- sedation complete  21:45- Dr. Lenon Curt decided to admit the patient, for surgery in the morning, at 10 AM. He asked that the hospitalist admit. Will order routine admission labs, will arrange for admission and   11:11 PM-Consult complete with Hospitalist. Patient case explained and discussed. He cannot admit the patient, but will consult,  for further evaluation and treatment. Call ended at 23:30  23:33- Repage Dr. Lenon Curt. Will admit to him and write holding orders.   Final Clinical Impressions(s) / ED Diagnoses   Final diagnoses:  Distal radius fracture, right, closed, initial encounter  Abrasion of right wrist, initial encounter   Mechanical fall with isolated injury, right wrist fracture, treatment required for reduction, by specialist, hand surgeon. Screening labs, ordered, to arrange for admission. They are essentially normal.  Nursing Notes Reviewed/ Care Coordinated Applicable Imaging Reviewed Interpretation of Laboratory Data incorporated into ED treatment  Plan: Admit   New Prescriptions New Prescriptions   No medications on file     Daleen Bo, MD 08/20/15 2348

## 2015-08-20 NOTE — Sedation Documentation (Signed)
OT applying splint at this time.

## 2015-08-21 ENCOUNTER — Observation Stay (HOSPITAL_COMMUNITY): Payer: Commercial Managed Care - HMO | Admitting: Anesthesiology

## 2015-08-21 ENCOUNTER — Observation Stay (HOSPITAL_COMMUNITY): Payer: Commercial Managed Care - HMO

## 2015-08-21 ENCOUNTER — Encounter (HOSPITAL_COMMUNITY)
Admission: EM | Disposition: A | Discharge status: Home Health | Payer: Self-pay | Source: Home / Self Care | Attending: Emergency Medicine

## 2015-08-21 DIAGNOSIS — E785 Hyperlipidemia, unspecified: Secondary | ICD-10-CM | POA: Diagnosis not present

## 2015-08-21 DIAGNOSIS — S52611A Displaced fracture of right ulna styloid process, initial encounter for closed fracture: Secondary | ICD-10-CM | POA: Diagnosis not present

## 2015-08-21 DIAGNOSIS — S52501A Unspecified fracture of the lower end of right radius, initial encounter for closed fracture: Secondary | ICD-10-CM | POA: Diagnosis not present

## 2015-08-21 DIAGNOSIS — F419 Anxiety disorder, unspecified: Secondary | ICD-10-CM | POA: Diagnosis not present

## 2015-08-21 DIAGNOSIS — F329 Major depressive disorder, single episode, unspecified: Secondary | ICD-10-CM | POA: Diagnosis not present

## 2015-08-21 DIAGNOSIS — S5291XD Unspecified fracture of right forearm, subsequent encounter for closed fracture with routine healing: Secondary | ICD-10-CM | POA: Diagnosis not present

## 2015-08-21 DIAGNOSIS — S61531A Puncture wound without foreign body of right wrist, initial encounter: Secondary | ICD-10-CM | POA: Diagnosis not present

## 2015-08-21 DIAGNOSIS — K219 Gastro-esophageal reflux disease without esophagitis: Secondary | ICD-10-CM | POA: Diagnosis not present

## 2015-08-21 DIAGNOSIS — G4700 Insomnia, unspecified: Secondary | ICD-10-CM | POA: Diagnosis not present

## 2015-08-21 DIAGNOSIS — S52531B Colles' fracture of right radius, initial encounter for open fracture type I or II: Secondary | ICD-10-CM | POA: Diagnosis not present

## 2015-08-21 DIAGNOSIS — Z01818 Encounter for other preprocedural examination: Secondary | ICD-10-CM | POA: Diagnosis not present

## 2015-08-21 DIAGNOSIS — S60811A Abrasion of right wrist, initial encounter: Secondary | ICD-10-CM | POA: Diagnosis not present

## 2015-08-21 DIAGNOSIS — G8918 Other acute postprocedural pain: Secondary | ICD-10-CM | POA: Diagnosis not present

## 2015-08-21 DIAGNOSIS — S52511A Displaced fracture of right radial styloid process, initial encounter for closed fracture: Secondary | ICD-10-CM | POA: Diagnosis not present

## 2015-08-21 DIAGNOSIS — I771 Stricture of artery: Secondary | ICD-10-CM | POA: Diagnosis not present

## 2015-08-21 HISTORY — PX: ORIF WRIST FRACTURE: SHX2133

## 2015-08-21 LAB — ABO/RH: ABO/RH(D): A POS

## 2015-08-21 SURGERY — OPEN REDUCTION INTERNAL FIXATION (ORIF) WRIST FRACTURE
Anesthesia: General | Site: Wrist | Laterality: Right

## 2015-08-21 MED ORDER — KETOROLAC TROMETHAMINE 30 MG/ML IJ SOLN: 15.0000 mg | Freq: Once | INTRAMUSCULAR | Status: DC | PRN

## 2015-08-21 MED ORDER — VITAMIN B-12 1000 MCG PO TABS
1000.0000 ug | ORAL_TABLET | Freq: Every day | ORAL | Status: DC
  Administered 2015-08-21 – 2015-08-22 (×2): 1000 ug via ORAL
  Filled 2015-08-21 (×2): qty 1

## 2015-08-21 MED ORDER — BUPIVACAINE HCL (PF) 0.5 % IJ SOLN
INTRAMUSCULAR | Status: DC | PRN
  Administered 2015-08-21: 20 mL via PERINEURAL

## 2015-08-21 MED ORDER — ASPIRIN 81 MG PO CHEW
81.0000 mg | CHEWABLE_TABLET | Freq: Two times a day (BID) | ORAL | Status: DC
  Administered 2015-08-21 – 2015-08-22 (×3): 81 mg via ORAL
  Filled 2015-08-21 (×3): qty 1

## 2015-08-21 MED ORDER — TRAMADOL HCL 50 MG PO TABS
50.0000 mg | ORAL_TABLET | Freq: Four times a day (QID) | ORAL | Status: DC | PRN
  Administered 2015-08-21: 50 mg via ORAL
  Administered 2015-08-22 (×2): 100 mg via ORAL
  Filled 2015-08-21: qty 2
  Filled 2015-08-21: qty 1
  Filled 2015-08-21: qty 2

## 2015-08-21 MED ORDER — DEXAMETHASONE SODIUM PHOSPHATE 10 MG/ML IJ SOLN
INTRAMUSCULAR | Status: DC | PRN
  Administered 2015-08-21: 10 mg via INTRAVENOUS

## 2015-08-21 MED ORDER — RALOXIFENE HCL 60 MG PO TABS
60.0000 mg | ORAL_TABLET | Freq: Every day | ORAL | Status: DC
  Administered 2015-08-21 – 2015-08-22 (×2): 60 mg via ORAL
  Filled 2015-08-21 (×2): qty 1

## 2015-08-21 MED ORDER — CEFAZOLIN IN D5W 1 GM/50ML IV SOLN
1.0000 g | Freq: Three times a day (TID) | INTRAVENOUS | Status: AC
Start: 2015-08-21 — End: 2015-08-22
  Administered 2015-08-21 – 2015-08-22 (×3): 1 g via INTRAVENOUS
  Filled 2015-08-21 (×4): qty 50

## 2015-08-21 MED ORDER — HYDROMORPHONE HCL 1 MG/ML IJ SOLN: 0.2500 mg | INTRAMUSCULAR | Status: DC | PRN

## 2015-08-21 MED ORDER — BUPIVACAINE HCL (PF) 0.25 % IJ SOLN
INTRAMUSCULAR | Status: AC
  Filled 2015-08-21: qty 30

## 2015-08-21 MED ORDER — LORAZEPAM 2 MG/ML IJ SOLN
0.5000 mg | Freq: Four times a day (QID) | INTRAMUSCULAR | Status: DC | PRN
  Administered 2015-08-21 (×2): 0.5 mg via INTRAVENOUS
  Filled 2015-08-21 (×2): qty 1

## 2015-08-21 MED ORDER — PROPOFOL 10 MG/ML IV BOLUS
INTRAVENOUS | Status: DC | PRN
  Administered 2015-08-21: 20 mg via INTRAVENOUS
  Administered 2015-08-21: 100 mg via INTRAVENOUS

## 2015-08-21 MED ORDER — ONDANSETRON HCL 4 MG/2ML IJ SOLN
INTRAMUSCULAR | Status: AC
  Filled 2015-08-21: qty 2

## 2015-08-21 MED ORDER — FENTANYL CITRATE (PF) 250 MCG/5ML IJ SOLN
INTRAMUSCULAR | Status: AC
  Filled 2015-08-21: qty 5

## 2015-08-21 MED ORDER — GABAPENTIN 300 MG PO CAPS
300.0000 mg | ORAL_CAPSULE | Freq: Two times a day (BID) | ORAL | Status: DC
  Administered 2015-08-21 – 2015-08-22 (×3): 300 mg via ORAL
  Filled 2015-08-21 (×3): qty 1

## 2015-08-21 MED ORDER — PROMETHAZINE HCL 25 MG/ML IJ SOLN: 6.2500 mg | INTRAMUSCULAR | Status: DC | PRN

## 2015-08-21 MED ORDER — FERROUS SULFATE 325 (65 FE) MG PO TABS
325.0000 mg | ORAL_TABLET | Freq: Three times a day (TID) | ORAL | Status: DC
  Administered 2015-08-21 – 2015-08-22 (×4): 325 mg via ORAL
  Filled 2015-08-21 (×4): qty 1

## 2015-08-21 MED ORDER — MORPHINE SULFATE (PF) 2 MG/ML IV SOLN: 2.0000 mg | INTRAVENOUS | Status: DC | PRN

## 2015-08-21 MED ORDER — 0.9 % SODIUM CHLORIDE (POUR BTL) OPTIME
TOPICAL | Status: DC | PRN
  Administered 2015-08-21: 1000 mL

## 2015-08-21 MED ORDER — MIDAZOLAM HCL 2 MG/2ML IJ SOLN
INTRAMUSCULAR | Status: AC
  Filled 2015-08-21: qty 2

## 2015-08-21 MED ORDER — LIDOCAINE-EPINEPHRINE (PF) 1.5 %-1:200000 IJ SOLN
INTRAMUSCULAR | Status: DC | PRN
  Administered 2015-08-21: 10 mL via PERINEURAL

## 2015-08-21 MED ORDER — NORTRIPTYLINE HCL 25 MG PO CAPS
25.0000 mg | ORAL_CAPSULE | Freq: Every day | ORAL | Status: DC
  Administered 2015-08-21: 25 mg via ORAL
  Filled 2015-08-21: qty 1

## 2015-08-21 MED ORDER — FENTANYL CITRATE (PF) 100 MCG/2ML IJ SOLN
INTRAMUSCULAR | Status: AC
  Filled 2015-08-21: qty 2

## 2015-08-21 MED ORDER — DEXAMETHASONE SODIUM PHOSPHATE 10 MG/ML IJ SOLN
INTRAMUSCULAR | Status: AC
  Filled 2015-08-21: qty 1

## 2015-08-21 MED ORDER — DOCUSATE SODIUM 100 MG PO CAPS
100.0000 mg | ORAL_CAPSULE | Freq: Two times a day (BID) | ORAL | Status: DC | PRN
  Administered 2015-08-22: 100 mg via ORAL
  Filled 2015-08-21: qty 1

## 2015-08-21 MED ORDER — MIDAZOLAM HCL 5 MG/ML IJ SOLN: 2.0000 mg | Freq: Once | INTRAMUSCULAR | Status: DC

## 2015-08-21 MED ORDER — SODIUM CHLORIDE 0.9 % IV SOLN
INTRAVENOUS | Status: AC
  Administered 2015-08-21: 12:00:00 via INTRAVENOUS

## 2015-08-21 MED ORDER — TIZANIDINE HCL 4 MG PO TABS
4.0000 mg | ORAL_TABLET | Freq: Four times a day (QID) | ORAL | Status: DC | PRN
  Filled 2015-08-21: qty 1

## 2015-08-21 MED ORDER — PANTOPRAZOLE SODIUM 40 MG PO TBEC
40.0000 mg | DELAYED_RELEASE_TABLET | Freq: Every day | ORAL | Status: DC
  Administered 2015-08-21 – 2015-08-22 (×2): 40 mg via ORAL
  Filled 2015-08-21 (×2): qty 1

## 2015-08-21 MED ORDER — LIDOCAINE HCL (CARDIAC) 20 MG/ML IV SOLN
INTRAVENOUS | Status: DC | PRN
  Administered 2015-08-21: 60 mg via INTRAVENOUS

## 2015-08-21 MED ORDER — LORATADINE 10 MG PO TABS
10.0000 mg | ORAL_TABLET | Freq: Every day | ORAL | Status: DC
  Administered 2015-08-21 – 2015-08-22 (×2): 10 mg via ORAL
  Filled 2015-08-21 (×2): qty 1

## 2015-08-21 MED ORDER — ESMOLOL HCL 100 MG/10ML IV SOLN
INTRAVENOUS | Status: DC | PRN
  Administered 2015-08-21: 5 mg via INTRAVENOUS

## 2015-08-21 MED ORDER — CEFAZOLIN SODIUM 1 G IJ SOLR
INTRAMUSCULAR | Status: AC
  Filled 2015-08-21: qty 20

## 2015-08-21 MED ORDER — ONDANSETRON HCL 4 MG/2ML IJ SOLN
INTRAMUSCULAR | Status: DC | PRN
  Administered 2015-08-21: 4 mg via INTRAVENOUS

## 2015-08-21 MED ORDER — WHITE PETROLATUM GEL
Status: AC
  Administered 2015-08-21: 01:00:00
  Filled 2015-08-21: qty 1

## 2015-08-21 MED ORDER — FENTANYL CITRATE (PF) 100 MCG/2ML IJ SOLN
100.0000 ug | Freq: Once | INTRAMUSCULAR | Status: AC
  Administered 2015-08-21: 50 ug via INTRAVENOUS

## 2015-08-21 MED ORDER — FAMOTIDINE IN NACL 20-0.9 MG/50ML-% IV SOLN
20.0000 mg | Freq: Every day | INTRAVENOUS | Status: DC
  Filled 2015-08-21: qty 50

## 2015-08-21 MED ORDER — VITAMIN D 1000 UNITS PO TABS
2000.0000 [IU] | ORAL_TABLET | Freq: Every day | ORAL | Status: DC
  Administered 2015-08-21: 2000 [IU] via ORAL
  Filled 2015-08-21: qty 2

## 2015-08-21 MED ORDER — PROPOFOL 10 MG/ML IV BOLUS
INTRAVENOUS | Status: AC
  Filled 2015-08-21: qty 20

## 2015-08-21 MED ORDER — ONDANSETRON HCL 4 MG/2ML IJ SOLN
4.0000 mg | Freq: Four times a day (QID) | INTRAMUSCULAR | Status: DC | PRN
Start: 2015-08-21 — End: 2015-08-22

## 2015-08-21 MED ORDER — SUGAMMADEX SODIUM 200 MG/2ML IV SOLN
INTRAVENOUS | Status: AC
  Filled 2015-08-21: qty 2

## 2015-08-21 MED ORDER — LACTATED RINGERS IV SOLN
INTRAVENOUS | Status: DC | PRN
  Administered 2015-08-21: 10:00:00 via INTRAVENOUS

## 2015-08-21 SURGICAL SUPPLY — 59 items
BANDAGE ACE 3X5.8 VEL STRL LF (GAUZE/BANDAGES/DRESSINGS) ×3 IMPLANT
BANDAGE ACE 4X5 VEL STRL LF (GAUZE/BANDAGES/DRESSINGS) ×3 IMPLANT
BANDAGE ELASTIC 3 VELCRO ST LF (GAUZE/BANDAGES/DRESSINGS) IMPLANT
BIT DRILL 2.2 SS TIBIAL (BIT) ×3 IMPLANT
BNDG ESMARK 4X9 LF (GAUZE/BANDAGES/DRESSINGS) ×3 IMPLANT
CANISTER SUCTION 1500CC (MISCELLANEOUS) ×3 IMPLANT
CHLORAPREP W/TINT 26ML (MISCELLANEOUS) ×3 IMPLANT
CORDS BIPOLAR (ELECTRODE) ×3 IMPLANT
COVER SURGICAL LIGHT HANDLE (MISCELLANEOUS) ×3 IMPLANT
CUFF TOURNIQUET SINGLE 18IN (TOURNIQUET CUFF) ×3 IMPLANT
CUFF TOURNIQUET SINGLE 24IN (TOURNIQUET CUFF) IMPLANT
DRAIN TLS ROUND 10FR (DRAIN) IMPLANT
DRAPE OEC MINIVIEW 54X84 (DRAPES) ×3 IMPLANT
DRAPE SURG 17X23 STRL (DRAPES) ×3 IMPLANT
GAUZE SPONGE 4X4 12PLY STRL (GAUZE/BANDAGES/DRESSINGS) ×3 IMPLANT
GAUZE XEROFORM 1X8 LF (GAUZE/BANDAGES/DRESSINGS) ×3 IMPLANT
GLOVE BIOGEL M 8.0 STRL (GLOVE) ×3 IMPLANT
GOWN STRL REUS W/ TWL XL LVL3 (GOWN DISPOSABLE) ×2 IMPLANT
GOWN STRL REUS W/TWL XL LVL3 (GOWN DISPOSABLE) ×4
K-WIRE 1.6 (WIRE) ×2
K-WIRE FX5X1.6XNS BN SS (WIRE) ×1
KIT BASIN OR (CUSTOM PROCEDURE TRAY) ×3 IMPLANT
KWIRE FX5X1.6XNS BN SS (WIRE) ×1 IMPLANT
NEEDLE HYPO 22GX1.5 SAFETY (NEEDLE) IMPLANT
NS IRRIG 1000ML POUR BTL (IV SOLUTION) ×3 IMPLANT
PACK ORTHO EXTREMITY (CUSTOM PROCEDURE TRAY) ×3 IMPLANT
PAD CAST 3X4 CTTN HI CHSV (CAST SUPPLIES) ×1 IMPLANT
PAD CAST 4YDX4 CTTN HI CHSV (CAST SUPPLIES) ×1 IMPLANT
PADDING CAST ABS 4INX4YD NS (CAST SUPPLIES)
PADDING CAST ABS COTTON 4X4 ST (CAST SUPPLIES) IMPLANT
PADDING CAST COTTON 3X4 STRL (CAST SUPPLIES) ×2
PADDING CAST COTTON 4X4 STRL (CAST SUPPLIES) ×2
PLATE CROSSLOCK MINI RT (Plate) ×3 IMPLANT
SCREW LOCK 12X2.7X 3 LD (Screw) ×2 IMPLANT
SCREW LOCK 14X2.7X 3 LD TPR (Screw) ×1 IMPLANT
SCREW LOCK 18X2.7X 3 LD TPR (Screw) ×4 IMPLANT
SCREW LOCKING 2.7X12MM (Screw) ×4 IMPLANT
SCREW LOCKING 2.7X14 (Screw) ×2 IMPLANT
SCREW LOCKING 2.7X18 (Screw) ×8 IMPLANT
SCREW NONLOCK 2.7X18MM (Screw) ×3 IMPLANT
SPLINT PLASTER CAST XFAST 4X15 (CAST SUPPLIES) IMPLANT
SPLINT PLASTER EXTRA FAST 3X15 (CAST SUPPLIES) ×2
SPLINT PLASTER GYPS XFAST 3X15 (CAST SUPPLIES) ×1 IMPLANT
SPLINT PLASTER XTRA FAST SET 4 (CAST SUPPLIES)
SUT ETHILON 3 0 PS 1 (SUTURE) ×3 IMPLANT
SUT ETHILON 4 0 PS 2 18 (SUTURE) ×3 IMPLANT
SUT ETHILON 5 0 PS 2 18 (SUTURE) ×3 IMPLANT
SUT VIC AB 3-0 PS1 18 (SUTURE) ×2
SUT VIC AB 3-0 PS1 18XBRD (SUTURE) ×1 IMPLANT
SUT VIC AB 3-0 SH 27 (SUTURE) ×2
SUT VIC AB 3-0 SH 27XBRD (SUTURE) ×1 IMPLANT
SUT VICRYL 4-0 PS2 18IN ABS (SUTURE) ×3 IMPLANT
SYR BULB 3OZ (MISCELLANEOUS) IMPLANT
SYR CONTROL 10ML LL (SYRINGE) IMPLANT
TOWEL OR 17X24 6PK STRL BLUE (TOWEL DISPOSABLE) ×3 IMPLANT
TUBE CONNECTING 20'X1/4 (TUBING) ×1
TUBE CONNECTING 20X1/4 (TUBING) ×2 IMPLANT
UNDERPAD 30X30 INCONTINENT (UNDERPADS AND DIAPERS) ×3 IMPLANT
WATER STERILE IRR 1000ML POUR (IV SOLUTION) ×3 IMPLANT

## 2015-08-21 NOTE — Progress Notes (Signed)
Patient and spouse c/o having to do the required EKG this morning.  Explained that it is a pre-operative procedure.  Patient and spouse c/o chg bath, also explained that this is hospital policy and a pre-operative procedure.  Patient's spouse c/o the percocet that his wife was given in the ED.  He said that it made his wife confused and she tried "swatting" him.  Then he wanted me to give her another percocet for pain that his wife denies.

## 2015-08-21 NOTE — Op Note (Signed)
NAME:  [name], [name]            ACCOUNT NO.:  [id number]  MEDICAL RECORD NO.:  [id number]  LOCATION:  [facility]                         FACILITY:  [facility]  PHYSICIAN:  Dennie Bible, MD    DATE OF BIRTH:  [birth year]  DATE OF PROCEDURE:  08/21/2015 DATE OF DISCHARGE:                              OPERATIVE REPORT   PREOPERATIVE DIAGNOSIS:  Grade 1 open fracture to the right distal radius and ulnar styloid.  POSTOPERATIVE DIAGNOSIS:  Grade 1 open fracture to the right distal radius and ulnar styloid.  PROCEDURE:  Open reduction and internal fixation of the right distal radius with a Biomet volar plate.  Irrigation of small puncture wound on the ulnar side of the wrist.  INDICATIONS:  Ms. [name] is an 82 year old female, who just had her knee replaced approximately 10 days ago by Dr. Alvan Dame.  She tried to sit down in a chair and missed the chair and fell on an outstretched hand sustaining a fracture of her right wrist.  She was seen in the emergency department.  Closed reduction was performed in the emergency department, however, with her comorbidities, it was felt that more definitive stable construct was indicated for her.  These were discussed thoroughly with her and her significant other.  She was admitted to the hospital overnight and taken to surgery today for fixation.  Consent was obtained.  DESCRIPTION OF PROCEDURE:  The patient received a preoperative block by Anesthesia in the holding area.  She was taken back to the operating room, placed supine on the operating room table.  A time-out was performed.  General anesthesia was administered without difficulty.  The right upper extremity was prepped and draped in the normal sterile fashion.  It was exsanguinated and a tourniquet was inflated to 250 mmHg.  A volar incision overlying the FCR tendon was then made and careful dissection was carried between the tendon and the radial artery down to the pronator quadratus.  Most of  the pronator quadratus muscle was obliterated due to the fracture site.  This was cleansed.  The fracture site was cleansed as well and irrigated.  In-line traction and pressure were then placed in the wrist nicely reducing the fracture. An appropriate size Biomet volar plate was chosen, temporarily held in place with the K-wires while adjusted under x-ray.  The patient had quite a bit of radial inclination, and therefore, the plate was somewhat angulated, however, this was felt to capture all fragments and provide adequate fixation.  First the radial shaft screws were each drilled, measured, and appropriate size screws were placed.  Next, all but the most radial screw of the plate were each drilled, measured, and appropriate size screws placed.  Afterwards the DRUJ felt stable, range of motion of the wrist felt stable.  All screw lengths appeared out of the joint under multiple x-ray views.  The wound was then irrigated. The small puncture wound on the ulnar side of the wrist was also irrigated thoroughly, this was left open.  The incision was closed in layers.  Approximated the deep fascia with interrupted 3-0 Vicryl.  The subcutaneous tissues with 3-0 Vicryl.  Skin was closed with a running 5-0 nylon stitch.  A  sterile dressing and splint were placed.  The patient will be non-weight bearing.  Social Work will be consulted to determine her new needs for her knee and now wrist rehab.     Dennie Bible, MD     HCC/MEDQ  D:  08/21/2015  T:  08/21/2015  Job:  [id number]

## 2015-08-21 NOTE — Progress Notes (Signed)
PROGRESS NOTE    [name]  [id number] DOB: [birth year] DOA: 08/20/2015 PCP: Tivis Ringer, MD   Brief Narrative:  [name] is a 82 y.o. right-handed female with medical history significant for GERD, depression, anxiety, and arthritis status-post right TKA 10 days prior to admission who presents to the ED with severe right wrist pain and deformity following a fall onto outstretched hand at home just prior to arrival.    Assessment & Plan:   Principal Problem:   Radius fracture Active Problems:   Esophageal reflux   S/P right TKA   Leukocytosis   Insomnia   Preoperative testing  1. Right radium and ulnar styloid fractures: S/p ORIF. Today by Dr Lenon Curt.  Pain control and PT eval in am.   2. S/p RIGHT TKA; Resume PT while in pt.   3. Gerd: Stable.   4. Anxiety:  Stable.     DVT prophylaxis:SCD'S Code Status: (Full) Family Communication: none at bedside.  Disposition Plan: pending PT eval.      Procedures: Open reduction and internal fixation of the right distal  radius with a Biomet volar plate.   Antimicrobials:    Subjective: Pain controlled.   Objective: Vitals:   08/21/15 1130 08/21/15 1145 08/21/15 1216 08/21/15 1432  BP: (!) 155/80 (!) 145/78 (!) 159/66 137/77  Pulse: 87 85 73 77  Resp: 20 20 18 18   Temp:  97.6 F (36.4 C) 98 F (36.7 C) 98.1 F (36.7 C)  TempSrc:   Oral Oral  SpO2: 99% 98% 99% 100%  Weight:      Height:        Intake/Output Summary (Last 24 hours) at 08/21/15 1830 Last data filed at 08/21/15 1558  Gross per 24 hour  Intake             1170 ml  Output             1655 ml  Net             -485 ml   Filed Weights   08/20/15 1726  Weight: 69.9 kg (154 lb)    Examination:  General exam: Appears calm and comfortable  Respiratory system: Clear to auscultation. Respiratory effort normal. Cardiovascular system: S1 & S2 heard, RRR. No JVD, murmurs, rubs, gallops or clicks. No pedal  edema. Gastrointestinal system: Abdomen is nondistended, soft and nontender. No organomegaly or masses felt. Normal bowel sounds heard. Central nervous system: Alert and oriented. No focal neurological deficits. Extremities: right upper extremity bandaged and right knee bandage , without drainage.  Skin: No rashes, lesions or ulcers Psychiatry: Judgement and insight appear normal. Mood & affect appropriate.     Data Reviewed: I have personally reviewed following labs and imaging studies  CBC:  Recent Labs Lab 08/20/15 2205  WBC 11.9*  NEUTROABS 9.6*  HGB 12.4  HCT 38.7  MCV 92.4  PLT A999333   Basic Metabolic Panel:  Recent Labs Lab 08/20/15 2205  NA 136  K 4.3  CL 103  CO2 26  GLUCOSE 115*  BUN 14  CREATININE 0.83  CALCIUM 8.9   GFR: Estimated Creatinine Clearance: 48.2 mL/min (by C-G formula based on SCr of 0.83 mg/dL). Liver Function Tests: No results for input(s): AST, ALT, ALKPHOS, BILITOT, PROT, ALBUMIN in the last 168 hours. No results for input(s): LIPASE, AMYLASE in the last 168 hours. No results for input(s): AMMONIA in the last 168 hours. Coagulation Profile: No results for input(s): INR, PROTIME in the last 168  hours. Cardiac Enzymes: No results for input(s): CKTOTAL, CKMB, CKMBINDEX, TROPONINI in the last 168 hours. BNP (last 3 results) No results for input(s): PROBNP in the last 8760 hours. HbA1C: No results for input(s): HGBA1C in the last 72 hours. CBG: No results for input(s): GLUCAP in the last 168 hours. Lipid Profile: No results for input(s): CHOL, HDL, LDLCALC, TRIG, CHOLHDL, LDLDIRECT in the last 72 hours. Thyroid Function Tests: No results for input(s): TSH, T4TOTAL, FREET4, T3FREE, THYROIDAB in the last 72 hours. Anemia Panel: No results for input(s): VITAMINB12, FOLATE, FERRITIN, TIBC, IRON, RETICCTPCT in the last 72 hours. Sepsis Labs: No results for input(s): PROCALCITON, LATICACIDVEN in the last 168 hours.  No results found for  this or any previous visit (from the past 240 hour(s)).       Radiology Studies: Dg Chest 1 View  Result Date: 08/21/2015 CLINICAL DATA:  Wrist fracture, preoperative evaluation. History of breast cancer. EXAM: CHEST 1 VIEW COMPARISON:  Chest radiograph April 03, 2007 FINDINGS: Cardiac silhouette is normal in size. Mildly tortuous calcified aorta. No pleural effusion focal consolidation. No pneumothorax. Status post LEFT shoulder arthroplasty. Surgical clips LEFT chest wall. Small probable loose bodies about RIGHT shoulder, incompletely imaged, status post RIGHT distal clavicle resection. Surgical clips in the included right abdomen compatible with cholecystectomy. IMPRESSION: No acute cardiopulmonary process. Electronically Signed   By: Elon Alas M.D.   On: 08/21/2015 00:40   Dg Wrist Complete Right  Result Date: 08/20/2015 CLINICAL DATA:  Pain after trauma EXAM: RIGHT WRIST - COMPLETE 3+ VIEW COMPARISON:  None FINDINGS: There is a displaced fracture of the distal radius. There may also be a displaced ulnar styloid fracture as a normal ulnar styloid is not seen. No wrist dislocation is identified. No other acute abnormalities are identified. IMPRESSION: Displaced fracture of the distal radius. Possible displaced ulnar styloid fracture has well. Electronically Signed   By: Dorise Bullion III M.D   On: 08/20/2015 18:19        Scheduled Meds: . sodium chloride   Intravenous STAT  . aspirin  81 mg Oral BID  .  ceFAZolin (ANCEF) IV  1 g Intravenous Q8H  . cholecalciferol  2,000 Units Oral QHS  . fentaNYL      . ferrous sulfate  325 mg Oral TID PC  . gabapentin  300 mg Oral BID  . loratadine  10 mg Oral Daily  . midazolam      . midazolam  2 mg Intravenous Once  . nortriptyline  25 mg Oral QHS  . pantoprazole  40 mg Oral Daily  . raloxifene  60 mg Oral Daily  . vitamin B-12  1,000 mcg Oral Daily   Continuous Infusions:    LOS: 0 days    Time spent: 25 minutes.      Hosie Poisson, MD [facility] Pager [phone]  If 7PM-7AM, please contact night-coverage [email or web address] Password [email or web address] 08/21/2015, 6:30 PM

## 2015-08-21 NOTE — Anesthesia Procedure Notes (Signed)
Anesthesia Regional Block:  Supraclavicular block  Pre-Anesthetic Checklist: ,, timeout performed, Correct Patient, Correct Site, Correct Laterality, Correct Procedure, Correct Position, site marked, Risks and benefits discussed,  Surgical consent,  Pre-op evaluation,  At surgeon's request and post-op pain management  Laterality: Right  Prep: chloraprep       Needles:  Injection technique: Single-shot  Needle Type: Stimiplex     Needle Length: 9cm 9 cm Needle Gauge: 21 G    Additional Needles:  Procedures: ultrasound guided (picture in chart) Supraclavicular block Narrative:  Injection made incrementally with aspirations every 5 mL.  Performed by: Personally  Anesthesiologist: Quetzalli Clos  Additional Notes: Patient tolerated the procedure well without complications

## 2015-08-21 NOTE — Anesthesia Postprocedure Evaluation (Signed)
Anesthesia Post Note  Patient: [name]  Procedure(s) Performed: Procedure(s) (LRB): OPEN REDUCTION INTERNAL FIXATION (ORIF) WRIST FRACTURE (Right)  Patient location during evaluation: PACU Anesthesia Type: General and Regional Level of consciousness: awake and alert Pain management: pain level controlled Vital Signs Assessment: post-procedure vital signs reviewed and stable Respiratory status: spontaneous breathing, nonlabored ventilation, respiratory function stable and patient connected to nasal cannula oxygen Cardiovascular status: blood pressure returned to baseline and stable Postop Assessment: no signs of nausea or vomiting Anesthetic complications: no    Last Vitals:  Vitals:   08/21/15 1216 08/21/15 1432  BP: (!) 159/66 137/77  Pulse: 73 77  Resp: 18 18  Temp: 36.7 C 36.7 C    Last Pain:  Vitals:   08/21/15 1432  TempSrc: Oral  PainSc: 1                  Abdou Stocks S

## 2015-08-21 NOTE — Anesthesia Procedure Notes (Signed)
Procedure Name: LMA Insertion Date/Time: 08/21/2015 10:07 AM Performed by: Myna Bright Pre-anesthesia Checklist: Patient identified, Emergency Drugs available, Suction available and Patient being monitored Patient Re-evaluated:Patient Re-evaluated prior to inductionOxygen Delivery Method: Circle system utilized Preoxygenation: Pre-oxygenation with 100% oxygen Intubation Type: IV induction Ventilation: Mask ventilation without difficulty LMA: LMA inserted LMA Size: 4.0 Tube type: Oral Number of attempts: 1 Placement Confirmation: positive ETCO2 and breath sounds checked- equal and bilateral Tube secured with: Tape Dental Injury: Teeth and Oropharynx as per pre-operative assessment

## 2015-08-21 NOTE — Progress Notes (Signed)
Orthopedic Tech Progress Note Patient Details:  [name] 1929-05-04 [id number]  Patient ID: [name], female   DOB: 09/19/1929, 82 y.o.   MRN: [id number]   Hildred Priest 08/21/2015, 11:57 AM Viewed order from RN order list

## 2015-08-21 NOTE — Anesthesia Preprocedure Evaluation (Signed)
Anesthesia Evaluation  Patient identified by MRN, date of birth, ID band Patient awake    Reviewed: Allergy & Precautions, NPO status , Patient's Chart, lab work & pertinent test results  Airway Mallampati: II  TM Distance: >3 FB Neck ROM: Full    Dental no notable dental hx.    Pulmonary neg pulmonary ROS, former smoker,    Pulmonary exam normal breath sounds clear to auscultation       Cardiovascular negative cardio ROS Normal cardiovascular exam Rhythm:Regular Rate:Normal     Neuro/Psych negative neurological ROS  negative psych ROS   GI/Hepatic Neg liver ROS, GERD  ,  Endo/Other  negative endocrine ROS  Renal/GU negative Renal ROS  negative genitourinary   Musculoskeletal negative musculoskeletal ROS (+)   Abdominal   Peds negative pediatric ROS (+)  Hematology negative hematology ROS (+)   Anesthesia Other Findings   Reproductive/Obstetrics negative OB ROS                             Anesthesia Physical Anesthesia Plan  ASA: II  Anesthesia Plan: General   Post-op Pain Management: GA combined w/ Regional for post-op pain   Induction: Intravenous  Airway Management Planned: LMA  Additional Equipment:   Intra-op Plan:   Post-operative Plan: Extubation in OR  Informed Consent: I have reviewed the patients History and Physical, chart, labs and discussed the procedure including the risks, benefits and alternatives for the proposed anesthesia with the patient or authorized representative who has indicated his/her understanding and acceptance.   Dental advisory given  Plan Discussed with: CRNA and Surgeon  Anesthesia Plan Comments: (No versed)        Anesthesia Quick Evaluation

## 2015-08-21 NOTE — Progress Notes (Signed)
Orthopedic Tech Progress Note Patient Details:  [name] 1929-10-12 [id number]  Ortho Devices Type of Ortho Device: Arm sling Ortho Device/Splint Location: rue Ortho Device/Splint Interventions: Application   Hildred Priest 08/21/2015, 11:57 AM

## 2015-08-21 NOTE — Consult Note (Signed)
Medical Consultation   [name] [id number] DOB: 1929-08-06 DOA: 08/20/2015  Referring physician: Dr. Lenon Curt   Reason for consult: Medical management of surgical patient  Patient coming from: Home   Chief Complaint: Fall onto outstretched right hand, right wrist pain  HPI: [name] is a 82 y.o. right-handed female with medical history significant for GERD, depression, anxiety, and arthritis status-post right TKA 10 days prior to admission who presents to the ED with severe right wrist pain and deformity following a fall onto outstretched hand at home just prior to arrival. Patient reports that she had been in her usual state of health, progressing well with home PT following the right TKA. She was attempting to sit down at home on a chair with wheels, but unfortunately the chair rolled out from under her and she fell backwards onto an outstretched right arm. There was immediate pain at the site and she reported a deformity as well. There was no break in the skin noted. She is not on anticoagulation. She tolerated the recent TKA with no immediate complications, but postoperative pain control was complicated by some transient confusion with narcotics. Patient denies any recent fevers, chills, chest pain, palpitations, dyspnea, or cough. There has been no dysuria, suprapubic tenderness, or flank pain. Patient denies any significant cardiac or pulmonary history. She had transthoracic echo performed in 2016 with preserved EF, no wall motion abnormalities, and only mild AS and TR. She has been working with home health PT and exercising daily without cardiopulmonary symptoms.  ED Course: Upon arrival to the ED, patient is found to be afebrile, saturating well on room air, and with vital signs otherwise stable. Radiograph of the right wrist demonstrates displaced fracture of the distal radius and possible displaced ulnar styloid fracture. Chemistry panel is unremarkable and CBC is notable  only for mild leukocytosis to 11,900. Orthopedic surgery was consulted by the ED physician and evaluated the patient in the emergency department. Patient was sedated in the ED and the fracture was reduced successfully with no immediate complication. Surgeon has tentative plans for ORIF in the morning. Patient has remained hemodynamically stable in the emergency department with no respiratory issues or arrhythmia on the telemetry monitor.   Review of Systems:  All other systems reviewed and apart from HPI, are negative.  Past Medical History:  Diagnosis Date  . Allergic rhinitis   . Anxiety   . Arthritis   . Breast cancer ([place])    left breast cancer  . Complication of anesthesia    memory loss  . Concussion 2015   fell and hit head  . Depression    situational depression  . Diverticulosis   . GERD (gastroesophageal reflux disease)   . Headache(784.0)    sinus HAs  . Hyperlipidemia   . Neuromuscular disorder (HCC)    neuropathy- nerve damage in neck causes nerve damage in toes    Past Surgical History:  Procedure Laterality Date  . ABDOMINAL HYSTERECTOMY    . CHOLECYSTECTOMY N/A 03/04/2015   Procedure: LAPAROSCOPIC CHOLECYSTECTOMY WITH INTRAOPERATIVE CHOLANGIOGRAM;  Surgeon: Greer Pickerel, MD;  Location: [place];  Service: General;  Laterality: N/A;  . ESOPHAGOGASTRODUODENOSCOPY N/A 08/15/2012   Procedure: ESOPHAGOGASTRODUODENOSCOPY (EGD);  Surgeon: Inda Castle, MD;  Location: [facility];  Service: Endoscopy;  Laterality: N/A;  . MASTECTOMY     left  . NASAL SINUS SURGERY     2004  . REPLACEMENT TOTAL KNEE     left  . REVERSE SHOULDER ARTHROPLASTY Left 11/11/2014  Procedure: LEFT REVERSE SHOULDER ARTHROPLASTY;  Surgeon: Justice Britain, MD;  Location: [place];  Service: Orthopedics;  Laterality: Left;  . SHOULDER SURGERY Bilateral    repairs, rot cuff repair  . STAPEDECTOMY Left    left ear  . TOTAL KNEE ARTHROPLASTY     right  . TOTAL KNEE ARTHROPLASTY Right 08/09/2015    Procedure: TOTAL KNEE ARTHROPLASTY;  Surgeon: Paralee Cancel, MD;  Location: [facility];  Service: Orthopedics;  Laterality: Right;     reports that she quit smoking about 57 years ago. Her smoking use included Cigarettes. She has a 3.00 pack-year smoking history. She has never used smokeless tobacco. She reports that she does not drink alcohol or use drugs.  Allergies  Allergen Reactions  . Dust Mite Extract Other (See Comments)    Sinus drainage  . Other Other (See Comments)    Smoke, Cat Dander cause sinus drainage  . Norco [Hydrocodone-Acetaminophen] Other (See Comments)    Hallucinations     Family History  Problem Relation Age of Onset  . Colon cancer Father 39  . Breast cancer Mother   . Leukemia Son   . Pancreatic cancer Sister      Prior to Admission medications   Medication Sig Start Date End Date Taking? Authorizing Provider  acetaminophen (TYLENOL) 325 MG tablet Take 2 tablets (650 mg total) by mouth every 6 (six) hours as needed for mild pain, moderate pain or fever. 08/11/15  Yes Danae Orleans, PA-C  aspirin 81 MG chewable tablet Chew 1 tablet (81 mg total) by mouth 2 (two) times daily. Take for 4 weeks, then resume regular dose. 08/11/15 09/10/15 Yes Matthew Babish, PA-C  Calcium Carb-Cholecalciferol (CALCIUM 600 + D PO) Take 600 mg by mouth 2 (two) times daily.   Yes Historical Provider, MD  Cholecalciferol 2000 UNITS TABS Take 2,000 Units by mouth at bedtime.   Yes Historical Provider, MD  docusate sodium (COLACE) 100 MG capsule Take 1 capsule (100 mg total) by mouth 2 (two) times daily. Patient taking differently: Take 100 mg by mouth 2 (two) times daily as needed for mild constipation.  08/10/15  Yes Danae Orleans, PA-C  esomeprazole (NEXIUM) 20 MG capsule Take 20 mg by mouth every evening.   Yes Historical Provider, MD  ferrous sulfate 325 (65 FE) MG tablet Take 1 tablet (325 mg total) by mouth 3 (three) times daily after meals. 08/10/15  Yes Matthew Babish, PA-C    fexofenadine (ALLEGRA) 180 MG tablet Take 180 mg by mouth every other day.    Yes Historical Provider, MD  gabapentin (NEURONTIN) 300 MG capsule Take 300 mg by mouth 2 (two) times daily.  04/30/15  Yes Historical Provider, MD  LORazepam (ATIVAN) 0.5 MG tablet Take 0.5 mg by mouth every 8 (eight) hours as needed for anxiety.   Yes Historical Provider, MD  Multiple Vitamin (MULTIVITAMIN WITH MINERALS) TABS tablet Take 1 tablet by mouth daily.   Yes Historical Provider, MD  nortriptyline (PAMELOR) 25 MG capsule Take 25 mg by mouth at bedtime.   Yes Historical Provider, MD  Omega-3 Fatty Acids (FISH OIL) 1200 MG CAPS Take 1,200 mg by mouth 2 (two) times daily.   Yes Historical Provider, MD  polyethylene glycol (MIRALAX / GLYCOLAX) packet Take 17 g by mouth 2 (two) times daily. Patient taking differently: Take 17 g by mouth daily as needed for mild constipation.  08/10/15  Yes Danae Orleans, PA-C  raloxifene (EVISTA) 60 MG tablet Take 60 mg by mouth daily.  Yes Historical Provider, MD  tiZANidine (ZANAFLEX) 4 MG tablet Take 1 tablet (4 mg total) by mouth every 6 (six) hours as needed for muscle spasms. 08/10/15  Yes Danae Orleans, PA-C  traMADol (ULTRAM) 50 MG tablet Take 1-2 tablets (50-100 mg total) by mouth every 6 (six) hours as needed. Patient taking differently: Take 50-100 mg by mouth every 6 (six) hours as needed for moderate pain.  08/11/15  Yes Danae Orleans, PA-C  vitamin B-12 (CYANOCOBALAMIN) 1000 MCG tablet Take 1,000 mcg by mouth daily.   Yes Historical Provider, MD  zolpidem (AMBIEN) 10 MG tablet Take 3.33 mg by mouth at bedtime as needed for sleep. 1/3 tablet   Yes Historical Provider, MD    Physical Exam: Vitals:   08/20/15 2125 08/20/15 2130 08/20/15 2200 08/20/15 2215  BP: 145/79 128/80 156/73 142/71  Pulse: 74 71 63 71  Resp: 19 15 13 19   Temp:      TempSrc:      SpO2: 99% 97% 100% 100%  Weight:      Height:          Constitutional: NAD, calm, comfortable Eyes:  PERTLA, lids and conjunctivae normal ENMT: Mucous membranes are dry. Posterior pharynx clear of any exudate or lesions.   Neck: normal, supple, no masses, no thyromegaly Respiratory: clear to auscultation bilaterally, no wheezing, no crackles. Normal respiratory effort.   Cardiovascular: S1 & S2 heard, regular rate and rhythm, with soft systolic murmur at LSB. No extremity edema. 2+ pedal pulses. No carotid bruits. No significant JVD. Abdomen: No distension, no tenderness, no masses palpated. Bowel sounds normal.  Musculoskeletal: no clubbing / cyanosis. Normal muscle tone. Right wrist immobilized in splint; sensation to light touch and cap refill appropriate in right hand.  Skin: no significant rashes, lesions, ulcers. Warm, dry, well-perfused. Neurologic: CN 2-12 grossly intact. Sensation intact, DTR normal. Strength 5/5 in all 4 limbs.  Psychiatric: Normal judgment and insight. Alert and oriented x 3. Normal mood and affect.     Labs on Admission: I have personally reviewed following labs and imaging studies  CBC:  Recent Labs Lab 08/20/15 2205  WBC 11.9*  NEUTROABS 9.6*  HGB 12.4  HCT 38.7  MCV 92.4  PLT A999333   Basic Metabolic Panel:  Recent Labs Lab 08/20/15 2205  NA 136  K 4.3  CL 103  CO2 26  GLUCOSE 115*  BUN 14  CREATININE 0.83  CALCIUM 8.9   GFR: Estimated Creatinine Clearance: 48.2 mL/min (by C-G formula based on SCr of 0.83 mg/dL). Liver Function Tests: No results for input(s): AST, ALT, ALKPHOS, BILITOT, PROT, ALBUMIN in the last 168 hours. No results for input(s): LIPASE, AMYLASE in the last 168 hours. No results for input(s): AMMONIA in the last 168 hours. Coagulation Profile: No results for input(s): INR, PROTIME in the last 168 hours. Cardiac Enzymes: No results for input(s): CKTOTAL, CKMB, CKMBINDEX, TROPONINI in the last 168 hours. BNP (last 3 results) No results for input(s): PROBNP in the last 8760 hours. HbA1C: No results for input(s): HGBA1C  in the last 72 hours. CBG: No results for input(s): GLUCAP in the last 168 hours. Lipid Profile: No results for input(s): CHOL, HDL, LDLCALC, TRIG, CHOLHDL, LDLDIRECT in the last 72 hours. Thyroid Function Tests: No results for input(s): TSH, T4TOTAL, FREET4, T3FREE, THYROIDAB in the last 72 hours. Anemia Panel: No results for input(s): VITAMINB12, FOLATE, FERRITIN, TIBC, IRON, RETICCTPCT in the last 72 hours. Urine analysis:    Component Value Date/Time   COLORURINE  AMBER (A) 12/12/2014 1914   APPEARANCEUR CLOUDY (A) 12/12/2014 1914   LABSPEC 1.029 12/12/2014 1914   PHURINE 5.0 12/12/2014 1914   GLUCOSEU NEGATIVE 12/12/2014 1914   HGBUR NEGATIVE 12/12/2014 [place] NEGATIVE 12/12/2014 1914   KETONESUR 15 (A) 12/12/2014 1914   PROTEINUR NEGATIVE 12/12/2014 1914   UROBILINOGEN 0.2 09/10/2013 0755   NITRITE NEGATIVE 12/12/2014 1914   LEUKOCYTESUR TRACE (A) 12/12/2014 1914   Sepsis Labs: @LABRCNTIP (procalcitonin:4,lacticidven:4) )No results found for this or any previous visit (from the past 240 hour(s)).   Radiological Exams on Admission: Dg Wrist Complete Right  Result Date: 08/20/2015 CLINICAL DATA:  Pain after trauma EXAM: RIGHT WRIST - COMPLETE 3+ VIEW COMPARISON:  None FINDINGS: There is a displaced fracture of the distal radius. There may also be a displaced ulnar styloid fracture as a normal ulnar styloid is not seen. No wrist dislocation is identified. No other acute abnormalities are identified. IMPRESSION: Displaced fracture of the distal radius. Possible displaced ulnar styloid fracture has well. Electronically Signed   By: Dorise Bullion III M.D   On: 08/20/2015 18:19    EKG: Ordered and pending.   Assessment/Plan  1. Right radius and ulnar styloid fractures  - Pt fell onto her outstretched dominant right hand just PTA  - Successfully reduced in the ED  - Tentative plans for ORIF in the am with Dr. Lenon Curt  - EKG remains pending but no arrhythmias are  noted on telemetry monitoring in the ED  - TTE from 2016 mild mild AS and TR only, normal systolic and diastolic function  - She has no known underlying lung disease and is saturating well on rm air with clear CXR  - UA remains pending; no sxs, but would advise treatment prior to ORIF if UA suggestive  - Has tolerated recent surgeries well  - Pt presents a low-risk for perioperative cardiac morbidity    2. S/p right TKA, POD #10  - Reports good progression with home health PT  - PT will need to be re-consulted in order to resume    3. Leukocytosis  - WBC 11.9 on admission, likely reactive to acute fracture  - No fever or apparent focus of infection  - Culture if febrile   4. GERD, hx of PUD - Stable, managed with Nexium 20 mg qD at home   - Had a duodenal ulcer on EGD in 2014, but no esophagitis  - Recommend treatment with Pepcid IV 20 mg qD while NPO   5. Anxiety   - Chronic, stable  - Managed with low-dose prn Ativan at home   - Recommend low-dose IV Ativan prn while NPO    DVT prophylaxis: Per surgeon preference   Code Status: Full  Family Communication: Husband updated at bedside    Vianne Bulls, MD [facility] Pager [phone]  If 7PM-7AM, please contact night-coverage [email or web address] Password [email or web address]  08/21/2015, 12:26 AM

## 2015-08-21 NOTE — Transfer of Care (Signed)
Immediate Anesthesia Transfer of Care Note  Patient: [name]  Procedure(s) Performed: Procedure(s): OPEN REDUCTION INTERNAL FIXATION (ORIF) WRIST FRACTURE (Right)  Patient Location: PACU  Anesthesia Type:General  Level of Consciousness: awake, alert , oriented and patient cooperative  Airway & Oxygen Therapy: Patient Spontanous Breathing and Patient connected to nasal cannula oxygen  Post-op Assessment: Report given to RN, Post -op Vital signs reviewed and stable and Patient moving all extremities  Post vital signs: Reviewed and stable  Last Vitals:  Vitals:   08/21/15 0627 08/21/15 0700  BP: (!) 168/86 (!) 155/75  Pulse: 84 87  Resp:  19  Temp: 37.2 C 37.3 C    Last Pain:  Vitals:   08/21/15 0700  TempSrc: Oral  PainSc:          Complications: No apparent anesthesia complications

## 2015-08-22 ENCOUNTER — Encounter (HOSPITAL_COMMUNITY): Payer: Self-pay | Admitting: General Surgery

## 2015-08-22 LAB — TYPE AND SCREEN
ABO/RH(D): A POS
Antibody Screen: NEGATIVE

## 2015-08-22 NOTE — Discharge Instructions (Signed)
°Cast or Splint Care  ° ° °Casts and splints support injured limbs and keep bones from moving while they heal. It is important to care for your cast or splint at home.  °HOME CARE INSTRUCTIONS  °Keep the cast or splint uncovered during the drying period. It can take 24 to 48 hours to dry if it is made of plaster. A fiberglass cast will dry in less than 1 hour.  °Do not rest the cast on anything harder than a pillow for the first 24 hours.  °Do not put weight on your injured limb or apply pressure to the cast until your health care provider gives you permission.  °Keep the cast or splint dry. Wet casts or splints can lose their shape and may not support the limb as well. A wet cast that has lost its shape can also create harmful pressure on your skin when it dries. Also, wet skin can become infected.  °Cover the cast or splint with a plastic bag when bathing or when out in the rain or snow. If the cast is on the trunk of the body, take sponge baths until the cast is removed.  °If your cast does become wet, dry it with a towel or a blow dryer on the cool setting only. °Keep your cast or splint clean. Soiled casts may be wiped with a moistened cloth.  °Do not place any hard or soft foreign objects under your cast or splint, such as cotton, toilet paper, lotion, or powder.  °Do not try to scratch the skin under the cast with any object. The object could get stuck inside the cast. Also, scratching could lead to an infection. If itching is a problem, use a blow dryer on a cool setting to relieve discomfort.  °Do not trim or cut your cast or remove padding from inside of it.  °Exercise all joints next to the injury that are not immobilized by the cast or splint. For example, if you have a long leg cast, exercise the hip joint and toes. If you have an arm cast or splint, exercise the shoulder, elbow, thumb, and fingers.  °Elevate your injured arm or leg on 1 or 2 pillows for the first 1 to 3 days to decrease swelling and  pain. It is best if you can comfortably elevate your cast so it is higher than your heart. °SEEK MEDICAL CARE IF:  °Your cast or splint cracks.  °Your cast or splint is too tight or too loose.  °You have unbearable itching inside the cast.  °Your cast becomes wet or develops a soft spot or area.  °You have a bad smell coming from inside your cast.  °You get an object stuck under your cast.  °Your skin around the cast becomes red or raw.  °You have new pain or worsening pain after the cast has been applied. °SEEK IMMEDIATE MEDICAL CARE IF:  °You have fluid leaking through the cast.  °You are unable to move your fingers or toes.  °You have discolored (blue or white), cool, painful, or very swollen fingers or toes beyond the cast.  °You have tingling or numbness around the injured area.  °You have severe pain or pressure under the cast.  °You have any difficulty with your breathing or have shortness of breath.  °You have chest pain. °This information is not intended to replace advice given to you by your health care provider. Make sure you discuss any questions you have with your health care provider.  °  Document Released: 12/30/1999 Document Revised: 10/22/2012 Document Reviewed: 07/10/2012  °Elsevier Interactive Patient Education ©2016 [employer]  ° °

## 2015-08-22 NOTE — Evaluation (Signed)
Occupational Therapy Evaluation Patient Details Name: [name] MRN: [id number] DOB: [birth year] Today's Date: 08/22/2015    History of Present Illness 82 y.o. female admitted to [facility] on 08/20/15 s/p fall with R wrist fx s/p ORIF on 08/22/15.  Pt with significant PMhx of R TKA on 08/09/15, L TKA, L reverse TSA, neuropathy, concussion, and L breast CA.   Clinical Impression   This 82 yo female admitted and underwent above presents to acute OT with deficit below. Will follow up with pt tomorrow if still her, if not feel she will do fine with husband A prn--he also wrote down exercises.   Follow Up Recommendations  No OT follow up;Supervision/Assistance - 24 hour    Equipment Recommendations  None recommended by OT       Precautions / Restrictions Precautions Precautions: Knee;Fall Restrictions Weight Bearing Restrictions: Yes RUE Weight Bearing: Weight bear through elbow only Other Position/Activity Restrictions: knee WBAT      Mobility Bed Mobility               General bed mobility comments: Pt up in recliner upon arrival  Transfers Overall transfer level: Needs assistance Equipment used: Right platform walker Transfers: Sit to/from Stand Sit to Stand: Min guard         General transfer comment: Cues for sequence of sit to stand from recliner    Balance Overall balance assessment: Needs assistance Sitting-balance support: Feet supported;No upper extremity supported Sitting balance-Leahy Scale: Good     Standing balance support: Bilateral upper extremity supported;During functional activity Standing balance-Leahy Scale: Fair Standing balance comment: reliant on RW                            ADL Overall ADL's : Needs assistance/impaired Eating/Feeding: Set up;Supervision/ safety;Sitting     Grooming Details (indicate cue type and reason): husband will A her prn now Upper Body Bathing: Minimal assitance;Sitting   Lower Body Bathing:  Moderate assistance (min guard A sit<>stand)   Upper Body Dressing : Minimal assistance;Sitting   Lower Body Dressing: Moderate assistance (min guard A sit<>stand)   Toilet Transfer: Min guard;Ambulation;BSC (PFRW)   Toileting- Clothing Manipulation and Hygiene: Moderate assistance (min guard A sit<>stand)         General ADL Comments: Pt's husband aware of how to keep pt's RUE dry for showering               Pertinent Vitals/Pain Pain Assessment: 0-10 Pain Score: 3  Pain Location: right knee medial aspect; arm does not hurt Pain Descriptors / Indicators: Aching;Sore Pain Intervention(s): Limited activity within patient's tolerance;Monitored during session;Repositioned     Hand Dominance Right   Extremity/Trunk Assessment Upper Extremity Assessment Upper Extremity Assessment: RUE deficits/detail RUE Deficits / Details: pt able to move her fingers within constraints of splint, elbow and proximally WNL RUE Coordination: decreased fine motor;decreased gross motor (digits with minimal edema; forearm blocked by splint)           Communication Communication Communication: No difficulties   Cognition Arousal/Alertness: Awake/alert Behavior During Therapy: WFL for tasks assessed/performed Overall Cognitive Status: Impaired/Different from baseline Area of Impairment: Memory     Memory: Decreased short-term memory                Exercises   Other Exercises Other Exercises: pt and husband educate on open/close of hand, bend/straighten elbow, raise arm up in air for shoulder exercises. Pt and husband also aware of keeping  arm elevated when pt is at rest        [place] expects to be discharged to:: Private residence Living Arrangements: Spouse/significant other Available Help at Discharge: Family;Available 24 hours/day Type of Home: House Home Access: Stairs to enter [employer] of Steps: 1 Entrance Stairs-Rails: None Home  Layout: One level     Bathroom Shower/Tub: Occupational psychologist: Standard Bathroom Accessibility: Yes How Accessible: Accessible via walker Home Equipment: [place] - single point;Walker - 2 wheels;Bedside commode;Shower seat - built in;Grab bars - tub/shower          Prior Functioning/Environment Level of Independence: Independent with assistive device(s);Needs assistance  Gait / Transfers Assistance Needed: had just transitioned to using a cane to walk.  ADL's / Homemaking Assistance Needed: husband has been A her prn since knee sx        OT Diagnosis: Generalized weakness;Acute pain   OT Problem List: Decreased strength;Decreased range of motion;Impaired UE functional use;Pain   OT Treatment/Interventions: Self-care/ADL training    OT Goals(Current goals can be found in the care plan section) Acute Rehab OT Goals Patient Stated Goal: to go home today OT Goal Formulation: With patient/family Time For Goal Achievement: 08/29/15 Potential to Achieve Goals: Good  OT Frequency: Min 2X/week              End of Session Equipment Utilized During Treatment: Rolling walker;Gait belt  Activity Tolerance: Patient tolerated treatment well Patient left: in chair;with call bell/phone within reach;with family/visitor present   Time: [id number] OT Time Calculation (min): 58 min Charges:  OT General Charges $OT Visit: 1 Procedure OT Treatments $Self Care/Home Management : 23-37 mins $Therapeutic Exercise: 8-22 mins G-Codes: OT G-codes **NOT FOR INPATIENT CLASS** Functional Assessment Tool Used: Clincal observation Functional Limitation: Self care Self Care Current Status [id number]): At least 20 percent but less than 40 percent impaired, limited or restricted Self Care Goal Status [id number]): At least 20 percent but less than 40 percent impaired, limited or restricted  Almon Register  [id number] 08/22/2015, 4:16 PM

## 2015-08-22 NOTE — Progress Notes (Signed)
Pt and husband given d/c instructions; both verbalized understanding; IV d/c at this time; pt to d/c home with husband; OT at bedside at this time assisting pt with home walker with new platform device; will cont. To monitor.  Ruben Reason

## 2015-08-22 NOTE — Care Management Note (Signed)
Case Management Note  Patient Details  Name: [name] MRN: [id number] Date of Birth: 1929-02-28  Subjective/Objective:                    Action/Plan:   Expected Discharge Date:                  Expected Discharge Plan:  [place]  In-House Referral:     Discharge planning Services  CM Consult  Post Acute Care Choice:  Home Health, Durable Medical Equipment Choice offered to:  Patient, Spouse  DME Arranged:  [place] Rile platform DME Agency:  [place] Arranged:  PT, OT [place] Agency:  [facility] (now [facility])  Status of Service:  Completed, signed off  If discussed at [employer] of Stay Meetings, dates discussed:    Additional Comments:  Marilu Favre, RN 08/22/2015, 2:07 PM

## 2015-08-22 NOTE — Progress Notes (Signed)
Occupational Therapy Treatment Patient Details Name: [name] MRN: [id number] DOB: 1929/03/03 Today's Date: 08/22/2015    History of present illness 82 y.o. female admitted to [facility] on 08/20/15 s/p fall with R wrist fx s/p ORIF on 08/22/15.  Pt with significant PMhx of R TKA on 08/09/15, L TKA, L reverse TSA, neuropathy, concussion, and L breast CA.   OT comments  This 82 yo female seen this session to fit right platform on her RW from home. Able to achieve satisfactory fit for pt and pt's husband aware of how to adjust it if needed.  Follow Up Recommendations  No OT follow up;Supervision/Assistance - 24 hour    Equipment Recommendations  None recommended by OT       Precautions / Restrictions Precautions Precautions: Knee;Fall Restrictions Weight Bearing Restrictions: Yes RUE Weight Bearing: Weight bear through elbow only Other Position/Activity Restrictions: knee WBAT       Mobility Bed Mobility               General bed mobility comments: Pt up in recliner upon arrival  Transfers Overall transfer level: Needs assistance Equipment used: Right platform walker Transfers: Sit to/from Stand Sit to Stand: Min guard         General transfer comment: Cues for sequence of sit to stand from recliner    Balance Overall balance assessment: Needs assistance Sitting-balance support: Feet supported;No upper extremity supported Sitting balance-Leahy Scale: Good     Standing balance support: Bilateral upper extremity supported;During functional activity Standing balance-Leahy Scale: Fair Standing balance comment: reliant on RW                   ADL     General ADL Comments: Called by RN to come and put on and make adjustment to Platform that pt just got for her personal RW. After some time we were able to get it to where it felf OK to pt--the issue was that it was not exactly like the one she had been using (therapy's) so we had to keep making adjustments to  get it to feel OK for the pt.                Cognition   Behavior During Therapy: WFL for tasks assessed/performed Overall Cognitive Status: Impaired/Different from baseline Area of Impairment: Memory     Memory: Decreased short-term memory                       General Comments      Pertinent Vitals/ Pain       Pain Assessment: 0-10 Pain Score: 3  Pain Location: right knee medial aspect Pain Descriptors / Indicators: Aching;Sore Pain Intervention(s): Monitored during session;Repositioned     Prior Functioning/Environment Level of Independence: Independent with assistive device(s);Needs assistance  Gait / Transfers Assistance Needed: had just transitioned to using a cane to walk.  ADL's / Homemaking Assistance Needed: husband has been A her prn since knee sx       Frequency Min 2X/week     Progress Toward Goals  OT Goals(current goals can now be found in the care plan section)  Progress towards OT goals:  (education completed with PFRW)  Acute Rehab OT Goals Patient Stated Goal: to go home today OT Goal Formulation: With patient/family Time For Goal Achievement: 08/29/15 Potential to Achieve Goals: Good  Plan Discharge plan remains appropriate       End of Session Equipment Utilized During Treatment: Rolling walker  Activity Tolerance Patient tolerated treatment well   Patient Left in chair;with call bell/phone within reach;with family/visitor present   Nurse Communication  (pt ready for someone to help her get dressed for home)    Functional Assessment Tool Used: Clincal observation Functional Limitation: Self care Self Care Current Status [id number]): At least 20 percent but less than 40 percent impaired, limited or restricted Self Care Goal Status [id number]): At least 20 percent but less than 40 percent impaired, limited or restricted   Time: 1411-1445 OT Time Calculation (min): 34 min  Charges: OT G-codes **NOT FOR INPATIENT CLASS** Functional  Assessment Tool Used: Clincal observation Functional Limitation: Self care Self Care Current Status [id number]): At least 20 percent but less than 40 percent impaired, limited or restricted Self Care Goal Status [id number]): At least 20 percent but less than 40 percent impaired, limited or restricted OT General Charges $OT Visit: 1 Procedure OT Treatments $Self Care/Home Management : 23-37 mins $Therapeutic Exercise: 8-22 mins  Almon Register [id number] 08/22/2015, 4:23 PM

## 2015-08-22 NOTE — Care Management Obs Status (Signed)
MEDICARE OBSERVATION STATUS NOTIFICATION   Patient Details  Name: [name] MRN: [id number] Date of Birth: 06/17/1929   Medicare Observation Status Notification Given:       Marilu Favre, RN 08/22/2015, 10:06 AM

## 2015-08-22 NOTE — Care Management Note (Addendum)
Case Management Note  Patient Details  Name: [name] MRN: [id number] Date of Birth: 02-19-1929  Subjective/Objective:                    Action/Plan:   Expected Discharge Date:                  Expected Discharge Plan:     In-House Referral:     Discharge planning Services  CM Consult  Post Acute Care Choice:  Home Health Choice offered to:  Patient, Spouse  DME Arranged:    DME Agency:     HH Arranged:    HH Agency:     Status of Service: completed  Ordered Rt platform attachment for walker  If discussed at [employer] of [employer], dates discussed:  Spoke with patient and husband at bedside. Patient had recent knee surgery done and had just completed HHPT with [facility] .   PT/OT to see her today if home health recommended and reordered they would like [facility]. Mary with [facility] at [facility] aware patient is hospitalized. Will continue to follow.  Additional Comments:  Marilu Favre, RN 08/22/2015, 10:05 AM

## 2015-08-22 NOTE — Evaluation (Signed)
Physical Therapy Evaluation Patient Details Name: [name] MRN: [id number] DOB: [birth year] Today's Date: 08/22/2015   History of Present Illness  82 y.o. female admitted to [facility] on 08/20/15 s/p fall with R wrist fx s/p ORIF on 08/22/15.  Pt with significant PMhx of R TKA on 08/09/15, L TKA, L reverse TSA, neuropathy, concussion, and L breast CA.  Clinical Impression  Pt is able to use R PFRW safely with assistance.  Her husband is very attentive and will be a good caregiver for her at home.  She is safe to d/c home with his assistance when MD deems appropriate.   PT to follow acutely for deficits listed below.       Follow Up Recommendations Home health PT;Supervision for mobility/OOB (resume HHPT with [facility])    Equipment Recommendations  Other (comment) (R platform attachment)    Recommendations for Other Services   NA    Precautions / Restrictions Precautions Precautions: Knee;Fall Restrictions Weight Bearing Restrictions: Yes RUE Weight Bearing: Non weight bearing (per op note)      Mobility  Bed Mobility Overal bed mobility: Modified Independent       Supine to sit: Modified independent (Device/Increase time);HOB elevated     General bed mobility comments: HOB mildly elevated, pt pulling on bed rail with left arm.  She was able to keep right wrist NWB and was pushing up appropriately with her elbow.   Transfers Overall transfer level: Needs assistance Equipment used: Right platform walker Transfers: Sit to/from Stand Sit to Stand: Min guard;From elevated surface         General transfer comment: Min guard assist for safety, cues for hand placment when going up and down, R PFRW adjusted to fit pt's height.   Ambulation/Gait Ambulation/Gait assistance: Min guard Ambulation Distance (Feet): 200 Feet Assistive device: Right platform walker Gait Pattern/deviations: Step-through pattern Gait velocity: decreased Gait velocity interpretation: Below normal speed  for age/gender General Gait Details: Cues to stay inside of RW when turning assist needed at trunk for balance.          Balance Overall balance assessment: Needs assistance Sitting-balance support: Feet supported;No upper extremity supported Sitting balance-Leahy Scale: Good     Standing balance support: Bilateral upper extremity supported;No upper extremity supported;Single extremity supported Standing balance-Leahy Scale: Fair                               Pertinent Vitals/Pain Pain Assessment: No/denies pain    Home Living Family/patient expects to be discharged to:: Private residence Living Arrangements: Spouse/significant other Available Help at Discharge: Family;Available 24 hours/day Type of Home: House Home Access: Stairs to enter Entrance Stairs-Rails: None Entrance Stairs-Number of Steps: 1 Home Layout: One level Home Equipment: Cane - single point;Walker - 2 wheels;Bedside commode;Shower seat - built in;Grab bars - tub/shower      Prior Function Level of Independence: Independent with assistive device(s);Needs assistance   Gait / Transfers Assistance Needed: had just transitioned to using a cane to walk.            Hand Dominance   Dominant Hand: Right    Extremity/Trunk Assessment   Upper Extremity Assessment: Defer to OT evaluation           Lower Extremity Assessment: RLE deficits/detail RLE Deficits / Details: right knee with recent TKA, bandage intact.  Pt with good knee flexion and extension ROM ~5-95 degress of flexion.  Strength at least 3/5 throughout.  Cervical / Trunk Assessment: Normal  Communication   Communication: No difficulties  Cognition Arousal/Alertness: Awake/alert Behavior During Therapy: WFL for tasks assessed/performed Overall Cognitive Status: Impaired/Different from baseline Area of Impairment: Memory     Memory: Decreased short-term memory (seems fairly dependent on her husband)                  Exercises Total Joint Exercises Ankle Circles/Pumps: AROM;Both;20 reps Quad Sets: AROM;Right;10 reps Heel Slides: AROM;Right;10 reps;Seated Straight Leg Raises: AROM;Right;10 reps Long Arc Quad: AROM;Right;10 reps;Seated      Assessment/Plan    PT Assessment Patient needs continued PT services  PT Diagnosis Difficulty walking;Abnormality of gait;Acute pain;Generalized weakness   PT Problem List Decreased strength;Decreased range of motion;Decreased activity tolerance;Decreased balance;Decreased mobility;Decreased knowledge of use of DME;Decreased safety awareness;Decreased knowledge of precautions;Pain  PT Treatment Interventions DME instruction;Gait training;Stair training;Therapeutic activities;Functional mobility training;Therapeutic exercise;Balance training;Patient/family education;Manual techniques;Modalities   PT Goals (Current goals can be found in the Care Plan section) Acute Rehab PT Goals Patient Stated Goal: to get back on track with her rehab PT Goal Formulation: With patient/family Time For Goal Achievement: 08/29/15 Potential to Achieve Goals: Good    Frequency Min 5X/week        End of Session Equipment Utilized During Treatment: Gait belt Activity Tolerance: Patient tolerated treatment well Patient left: in chair;with call bell/phone within reach;with family/visitor present Nurse Communication: Mobility status    Functional Assessment Tool Used: assist level Functional Limitation: Mobility: Walking and moving around Mobility: Walking and Moving Around Current Status [phone]): At least 20 percent but less than 40 percent impaired, limited or restricted Mobility: Walking and Moving Around Goal Status [phone]): At least 1 percent but less than 20 percent impaired, limited or restricted    Time: [id number] PT Time Calculation (min) (ACUTE ONLY): 35 min   Charges:   PT Evaluation $PT Eval Moderate Complexity: 1 Procedure PT Treatments $Gait Training:  8-22 mins   PT G Codes:   PT G-Codes **NOT FOR INPATIENT CLASS** Functional Assessment Tool Used: assist level Functional Limitation: Mobility: Walking and moving around Mobility: Walking and Moving Around Current Status [id number]): At least 20 percent but less than 40 percent impaired, limited or restricted Mobility: Walking and Moving Around Goal Status [phone]): At least 1 percent but less than 20 percent impaired, limited or restricted    Baneen Wieseler B. [place], [place], DPT [id number]   08/22/2015, 12:01 PM

## 2015-08-22 NOTE — Progress Notes (Signed)
S: hand feels good, has seen PT /OT  O:Blood pressure 129/63, pulse 67, temperature 98.7 F (37.1 C), temperature source Oral, resp. rate 18, height 5\' 3"  (1.6 m), weight 69.9 kg (154 lb), SpO2 96 %.  No calf tenderness, fingers with mild swelling, n/v intact; splint fits well, able to move fingers  A:s/p ORIF R radius   P: elevate R hand, move fingers nwb; maintain splint, PT for knee as before, platform walker provided.  F/u with me in 2 wks, prn

## 2015-08-22 NOTE — Discharge Summary (Signed)
NAMEJAPONICA, [name]            ACCOUNT NO.:  [id number]  MEDICAL RECORD NO.:  [id number]  LOCATION:  6N09C                        FACILITY:  [facility]  PHYSICIAN:  Dennie Bible, MD    DATE OF BIRTH:  13-Jun-1929  DATE OF ADMISSION:  08/20/2015 DATE OF DISCHARGE:  08/22/2015                              DISCHARGE SUMMARY   ADMITTING DIAGNOSES: 1. Fracture of the right distal radius status post. 2. Status post total knee replacement.  DISCHARGE DIAGNOSES: 1. Fracture of the right distal radius status post. 2. Status post total knee replacement.  HOSPITAL COURSE:  The patient was seen in the emergency department on the August 20, 2015, with a displaced fracture of the right distal radius.  Closed reduction was performed.  Because of her history of recent total knee arthroplasty, it was felt in her best interest to have her wrist definitively fixed with a plate, therefore she was admitted to the hospital, scheduled for surgery the following morning, on August 21, 2015.  Surgery went without complication.  Please see the operative note for details.  On August 22, 2015, she was seen, stable.  She was evaluated by Occupational Therapy and Physical Therapy, some adjustments were made to her walker.  Case Management felt she was stable for discharge with continuation of her home physical therapy that was already set up by [employer].  MEDICATION:  She is to resume all of her preoperative medications.  No additional medications have been added.  DIET:  She is to resume her normal diet.  ACTIVITY:  She is to continue with home physical therapy, as already been set up.  She is to use a platform walker.  She is nonweightbearing to the right wrist.  The right wrist should be elevated.  She has to move her fingers.  She is to be nonweightbearing on the right wrist.  FOLLOWUP CARE:  She is to follow up as indicated with her orthopedic surgeon for knee.  I should see her in  approximately 2 weeks for evaluation of her hand and wrist.     Dennie Bible, MD     HCC/MEDQ  D:  08/22/2015  T:  08/22/2015  Job:  [id number]

## 2015-08-23 DIAGNOSIS — R42 Dizziness and giddiness: Secondary | ICD-10-CM | POA: Diagnosis not present

## 2015-08-23 DIAGNOSIS — M1990 Unspecified osteoarthritis, unspecified site: Secondary | ICD-10-CM | POA: Diagnosis not present

## 2015-08-23 DIAGNOSIS — G609 Hereditary and idiopathic neuropathy, unspecified: Secondary | ICD-10-CM | POA: Diagnosis not present

## 2015-08-23 DIAGNOSIS — F419 Anxiety disorder, unspecified: Secondary | ICD-10-CM | POA: Diagnosis not present

## 2015-08-23 DIAGNOSIS — F329 Major depressive disorder, single episode, unspecified: Secondary | ICD-10-CM | POA: Diagnosis not present

## 2015-08-23 DIAGNOSIS — Z471 Aftercare following joint replacement surgery: Secondary | ICD-10-CM | POA: Diagnosis not present

## 2015-08-25 DIAGNOSIS — R42 Dizziness and giddiness: Secondary | ICD-10-CM | POA: Diagnosis not present

## 2015-08-25 DIAGNOSIS — Z471 Aftercare following joint replacement surgery: Secondary | ICD-10-CM | POA: Diagnosis not present

## 2015-08-25 DIAGNOSIS — F419 Anxiety disorder, unspecified: Secondary | ICD-10-CM | POA: Diagnosis not present

## 2015-08-25 DIAGNOSIS — F329 Major depressive disorder, single episode, unspecified: Secondary | ICD-10-CM | POA: Diagnosis not present

## 2015-08-25 DIAGNOSIS — M1990 Unspecified osteoarthritis, unspecified site: Secondary | ICD-10-CM | POA: Diagnosis not present

## 2015-08-25 DIAGNOSIS — G609 Hereditary and idiopathic neuropathy, unspecified: Secondary | ICD-10-CM | POA: Diagnosis not present

## 2015-08-30 DIAGNOSIS — M1990 Unspecified osteoarthritis, unspecified site: Secondary | ICD-10-CM | POA: Diagnosis not present

## 2015-08-30 DIAGNOSIS — Z471 Aftercare following joint replacement surgery: Secondary | ICD-10-CM | POA: Diagnosis not present

## 2015-08-30 DIAGNOSIS — F419 Anxiety disorder, unspecified: Secondary | ICD-10-CM | POA: Diagnosis not present

## 2015-08-30 DIAGNOSIS — G609 Hereditary and idiopathic neuropathy, unspecified: Secondary | ICD-10-CM | POA: Diagnosis not present

## 2015-08-30 DIAGNOSIS — F329 Major depressive disorder, single episode, unspecified: Secondary | ICD-10-CM | POA: Diagnosis not present

## 2015-08-30 DIAGNOSIS — R42 Dizziness and giddiness: Secondary | ICD-10-CM | POA: Diagnosis not present

## 2015-09-02 DIAGNOSIS — F419 Anxiety disorder, unspecified: Secondary | ICD-10-CM | POA: Diagnosis not present

## 2015-09-02 DIAGNOSIS — R42 Dizziness and giddiness: Secondary | ICD-10-CM | POA: Diagnosis not present

## 2015-09-02 DIAGNOSIS — Z471 Aftercare following joint replacement surgery: Secondary | ICD-10-CM | POA: Diagnosis not present

## 2015-09-02 DIAGNOSIS — Z96651 Presence of right artificial knee joint: Secondary | ICD-10-CM | POA: Diagnosis not present

## 2015-09-02 DIAGNOSIS — M1990 Unspecified osteoarthritis, unspecified site: Secondary | ICD-10-CM | POA: Diagnosis not present

## 2015-09-02 DIAGNOSIS — F329 Major depressive disorder, single episode, unspecified: Secondary | ICD-10-CM | POA: Diagnosis not present

## 2015-09-02 DIAGNOSIS — G609 Hereditary and idiopathic neuropathy, unspecified: Secondary | ICD-10-CM | POA: Diagnosis not present

## 2015-09-04 DIAGNOSIS — M1990 Unspecified osteoarthritis, unspecified site: Secondary | ICD-10-CM | POA: Diagnosis not present

## 2015-09-04 DIAGNOSIS — F329 Major depressive disorder, single episode, unspecified: Secondary | ICD-10-CM | POA: Diagnosis not present

## 2015-09-04 DIAGNOSIS — Z471 Aftercare following joint replacement surgery: Secondary | ICD-10-CM | POA: Diagnosis not present

## 2015-09-04 DIAGNOSIS — F419 Anxiety disorder, unspecified: Secondary | ICD-10-CM | POA: Diagnosis not present

## 2015-09-04 DIAGNOSIS — R42 Dizziness and giddiness: Secondary | ICD-10-CM | POA: Diagnosis not present

## 2015-09-04 DIAGNOSIS — G609 Hereditary and idiopathic neuropathy, unspecified: Secondary | ICD-10-CM | POA: Diagnosis not present

## 2015-09-05 DIAGNOSIS — S52531B Colles' fracture of right radius, initial encounter for open fracture type I or II: Secondary | ICD-10-CM | POA: Diagnosis not present

## 2015-09-07 DIAGNOSIS — G609 Hereditary and idiopathic neuropathy, unspecified: Secondary | ICD-10-CM | POA: Diagnosis not present

## 2015-09-07 DIAGNOSIS — F419 Anxiety disorder, unspecified: Secondary | ICD-10-CM | POA: Diagnosis not present

## 2015-09-07 DIAGNOSIS — M1990 Unspecified osteoarthritis, unspecified site: Secondary | ICD-10-CM | POA: Diagnosis not present

## 2015-09-07 DIAGNOSIS — Z471 Aftercare following joint replacement surgery: Secondary | ICD-10-CM | POA: Diagnosis not present

## 2015-09-07 DIAGNOSIS — R42 Dizziness and giddiness: Secondary | ICD-10-CM | POA: Diagnosis not present

## 2015-09-07 DIAGNOSIS — F329 Major depressive disorder, single episode, unspecified: Secondary | ICD-10-CM | POA: Diagnosis not present

## 2015-09-12 DIAGNOSIS — R42 Dizziness and giddiness: Secondary | ICD-10-CM | POA: Diagnosis not present

## 2015-09-12 DIAGNOSIS — G609 Hereditary and idiopathic neuropathy, unspecified: Secondary | ICD-10-CM | POA: Diagnosis not present

## 2015-09-12 DIAGNOSIS — F419 Anxiety disorder, unspecified: Secondary | ICD-10-CM | POA: Diagnosis not present

## 2015-09-12 DIAGNOSIS — Z471 Aftercare following joint replacement surgery: Secondary | ICD-10-CM | POA: Diagnosis not present

## 2015-09-12 DIAGNOSIS — F329 Major depressive disorder, single episode, unspecified: Secondary | ICD-10-CM | POA: Diagnosis not present

## 2015-09-12 DIAGNOSIS — M1990 Unspecified osteoarthritis, unspecified site: Secondary | ICD-10-CM | POA: Diagnosis not present

## 2015-09-14 DIAGNOSIS — F419 Anxiety disorder, unspecified: Secondary | ICD-10-CM | POA: Diagnosis not present

## 2015-09-14 DIAGNOSIS — Z471 Aftercare following joint replacement surgery: Secondary | ICD-10-CM | POA: Diagnosis not present

## 2015-09-14 DIAGNOSIS — R42 Dizziness and giddiness: Secondary | ICD-10-CM | POA: Diagnosis not present

## 2015-09-14 DIAGNOSIS — M1990 Unspecified osteoarthritis, unspecified site: Secondary | ICD-10-CM | POA: Diagnosis not present

## 2015-09-14 DIAGNOSIS — G609 Hereditary and idiopathic neuropathy, unspecified: Secondary | ICD-10-CM | POA: Diagnosis not present

## 2015-09-14 DIAGNOSIS — F329 Major depressive disorder, single episode, unspecified: Secondary | ICD-10-CM | POA: Diagnosis not present

## 2015-09-21 DIAGNOSIS — Z96651 Presence of right artificial knee joint: Secondary | ICD-10-CM | POA: Diagnosis not present

## 2015-09-21 DIAGNOSIS — M7061 Trochanteric bursitis, right hip: Secondary | ICD-10-CM | POA: Diagnosis not present

## 2015-09-21 DIAGNOSIS — S52531B Colles' fracture of right radius, initial encounter for open fracture type I or II: Secondary | ICD-10-CM | POA: Diagnosis not present

## 2015-09-26 DIAGNOSIS — M1711 Unilateral primary osteoarthritis, right knee: Secondary | ICD-10-CM | POA: Diagnosis not present

## 2015-09-28 DIAGNOSIS — Z96651 Presence of right artificial knee joint: Secondary | ICD-10-CM | POA: Diagnosis not present

## 2015-09-28 DIAGNOSIS — F418 Other specified anxiety disorders: Secondary | ICD-10-CM | POA: Diagnosis not present

## 2015-09-28 DIAGNOSIS — T148 Other injury of unspecified body region: Secondary | ICD-10-CM | POA: Diagnosis not present

## 2015-09-28 DIAGNOSIS — Z23 Encounter for immunization: Secondary | ICD-10-CM | POA: Diagnosis not present

## 2015-09-28 DIAGNOSIS — R413 Other amnesia: Secondary | ICD-10-CM | POA: Diagnosis not present

## 2015-09-28 DIAGNOSIS — E038 Other specified hypothyroidism: Secondary | ICD-10-CM | POA: Diagnosis not present

## 2015-09-28 DIAGNOSIS — Z6825 Body mass index (BMI) 25.0-25.9, adult: Secondary | ICD-10-CM | POA: Diagnosis not present

## 2015-09-28 DIAGNOSIS — M810 Age-related osteoporosis without current pathological fracture: Secondary | ICD-10-CM | POA: Diagnosis not present

## 2015-09-30 DIAGNOSIS — M1711 Unilateral primary osteoarthritis, right knee: Secondary | ICD-10-CM | POA: Diagnosis not present

## 2015-10-04 DIAGNOSIS — M1711 Unilateral primary osteoarthritis, right knee: Secondary | ICD-10-CM | POA: Diagnosis not present

## 2015-10-05 DIAGNOSIS — F419 Anxiety disorder, unspecified: Secondary | ICD-10-CM | POA: Diagnosis not present

## 2015-10-06 ENCOUNTER — Other Ambulatory Visit: Payer: Self-pay | Admitting: Diagnostic Neuroimaging

## 2015-10-06 DIAGNOSIS — M1711 Unilateral primary osteoarthritis, right knee: Secondary | ICD-10-CM | POA: Diagnosis not present

## 2015-10-06 MED ORDER — GABAPENTIN 300 MG PO CAPS: 300.0000 mg | ORAL_CAPSULE | Freq: Two times a day (BID) | ORAL | 0 refills | Status: DC

## 2015-10-06 NOTE — Telephone Encounter (Signed)
Called and spoke to [name], the husband. Advised I saw last f/u made in June was cancelled. Made f/u on 10/31/15 at 930am, check in 915am and advised we will refill med until appt. Dr Leta Baptist will provide further refills after f/u appt. He verbalized understanding.

## 2015-10-06 NOTE — Telephone Encounter (Signed)
Patient requesting refill of gabapentin (NEURONTIN) 300 MG capsule Pharmacy: [place], [place]

## 2015-10-06 NOTE — Telephone Encounter (Signed)
Dr Leta Baptist- just [facility]

## 2015-10-10 DIAGNOSIS — Z96612 Presence of left artificial shoulder joint: Secondary | ICD-10-CM | POA: Diagnosis not present

## 2015-10-11 DIAGNOSIS — M1711 Unilateral primary osteoarthritis, right knee: Secondary | ICD-10-CM | POA: Diagnosis not present

## 2015-10-14 DIAGNOSIS — M1711 Unilateral primary osteoarthritis, right knee: Secondary | ICD-10-CM | POA: Diagnosis not present

## 2015-10-24 DIAGNOSIS — M1711 Unilateral primary osteoarthritis, right knee: Secondary | ICD-10-CM | POA: Diagnosis not present

## 2015-10-28 DIAGNOSIS — M1711 Unilateral primary osteoarthritis, right knee: Secondary | ICD-10-CM | POA: Diagnosis not present

## 2015-10-31 ENCOUNTER — Ambulatory Visit (INDEPENDENT_AMBULATORY_CARE_PROVIDER_SITE_OTHER): Payer: Commercial Managed Care - HMO | Admitting: Diagnostic Neuroimaging

## 2015-10-31 ENCOUNTER — Encounter: Payer: Self-pay | Admitting: Diagnostic Neuroimaging

## 2015-10-31 VITALS — BP 108/73 | HR 90 | Wt 150.8 lb

## 2015-10-31 DIAGNOSIS — F411 Generalized anxiety disorder: Secondary | ICD-10-CM

## 2015-10-31 DIAGNOSIS — R413 Other amnesia: Secondary | ICD-10-CM

## 2015-10-31 NOTE — Progress Notes (Signed)
[facility]  PATIENT: [name] DOB: 06-09-1929  REFERRING CLINICIAN: Avva HISTORY FROM: patient  REASON FOR VISIT: follow up    HISTORICAL  CHIEF COMPLAINT:  Chief Complaint  Patient presents with  . Post concussion syndrome    rm 6, husband- [name], "2 HA in past month; most concerned with memory" MMSE 24  . Follow-up    6 month    HISTORY OF PRESENT ILLNESS:   UPDATE 10/31/15: Since last visit, patient now complaining of increasing memory problems. High stress levels, increased worry, poor sleep. Needs 1/3 tab ambien per night.   UPDATE 03/29/15: Since last visit, was doing well, then had episode of standing up --> then had sudden discomfort in head, prickly feeling, hot feeling, dizzy. Then next day had dizziness with head turned to the right. Then sometimes with head left. Now sxs slightly eased. Still with insomnia (interrupted sleep), daytime fatigue, grieving son's death in Oct 26, 2013. Goes to sleep at 10pm. Then takes small ambien every night after midnight when she might wake up.   UPDATE 01/20/15: Since last visit, was stable, until Dec 2016, then more headaches. Now HA are stabilized. Some more insomnia, anxiety issues, reports still grieving re: son's death in Sep 26, 2013.   UPDATE 2014/05/10: Since last vist doing much better with PT evaluation and exercises ([facility]). She is pleased with progress. She feels stronger and and safer with her balance.  UPDATE 03/08/14: Since last visit, patient was traveling overseas to [place] when she tripped and fell on a concrete slab, falling on her right face and right wrist. Patient suffered significant right periorbital ecchymosis, facial bruising, as well as right forearm fracture. Patient continues to have pain in her right head, headaches, balance difficulty. Patient follows up to further investigate neuropathy.  PRIOR HPI (01/20/14): 82 year old right-handed female with hypercholesterolemia, breast  cancer, here for evaluation of right scalp and facial discomfort, gait difficulty, postconcussion syndrome. 11/18/2013 patient tripped on her driveway, fell and possibly hit her car in the driveway. She then bounced backwards and landed on her left hip. She had significant left hip pain but didn't feel any had pain initially. After going inside should be developed dizziness clamminess, possible low blood pressure and low pulse rate, and went to the emergency room. Patient was diagnosed with concussion. Since that time she has continued discomfort in her right scalp, especially when her hair is minimally dilated. She has pain that radiates down to her right facial region. Patient has had ongoing intermittent pain, went to the emergency room second time, diagnosed with possible trigeminal neuralgia. Also with remote shingles in the right frontal V1 region many years ago. Patient also having progressive gait and balance difficulty, with at least 5-6 years of numbness and tingling in the toes. Apparently she has been diagnosed with neuropathy, but without specific cause.   REVIEW OF SYSTEMS: Full 14 system review of systems performed and negative except for: confusion depression anxiety fatigue.      ALLERGIES: Allergies  Allergen Reactions  . Dust Mite Extract Other (See Comments)    Sinus drainage  . Other Other (See Comments)    Smoke, Cat Dander cause sinus drainage  . Norco [Hydrocodone-Acetaminophen] Other (See Comments)    Hallucinations     HOME MEDICATIONS: Outpatient Medications Prior to Visit  Medication Sig Dispense Refill  . acetaminophen (TYLENOL) 325 MG tablet Take 2 tablets (650 mg total) by mouth every 6 (six) hours as needed for mild pain, moderate  pain or fever.    . Cholecalciferol 2000 UNITS TABS Take 2,000 Units by mouth at bedtime.    . docusate sodium (COLACE) 100 MG capsule Take 1 capsule (100 mg total) by mouth 2 (two) times daily. (Patient taking differently: Take 100  mg by mouth 2 (two) times daily as needed for mild constipation. ) 10 capsule 0  . esomeprazole (NEXIUM) 20 MG capsule Take 20 mg by mouth every evening.    . fexofenadine (ALLEGRA) 180 MG tablet Take 180 mg by mouth every other day.     . gabapentin (NEURONTIN) 300 MG capsule Take 1 capsule (300 mg total) by mouth 2 (two) times daily. 60 capsule 0  . LORazepam (ATIVAN) 0.5 MG tablet Take 0.5 mg by mouth every 8 (eight) hours as needed for anxiety.    . Multiple Vitamin (MULTIVITAMIN WITH MINERALS) TABS tablet Take 1 tablet by mouth daily.    . nortriptyline (PAMELOR) 25 MG capsule Take 25 mg by mouth at bedtime.    . Omega-3 Fatty Acids (FISH OIL) 1200 MG CAPS Take 1,200 mg by mouth 2 (two) times daily.    . polyethylene glycol (MIRALAX / GLYCOLAX) packet Take 17 g by mouth 2 (two) times daily. (Patient taking differently: Take 17 g by mouth daily as needed for mild constipation. ) 14 each 0  . raloxifene (EVISTA) 60 MG tablet Take 60 mg by mouth daily.    . traMADol (ULTRAM) 50 MG tablet Take 1-2 tablets (50-100 mg total) by mouth every 6 (six) hours as needed. (Patient taking differently: Take 50-100 mg by mouth every 6 (six) hours as needed for moderate pain. ) 80 tablet 0  . vitamin B-12 (CYANOCOBALAMIN) 1000 MCG tablet Take 1,000 mcg by mouth daily.    Marland Kitchen zolpidem (AMBIEN) 10 MG tablet Take 3.33 mg by mouth at bedtime as needed for sleep. 1/3 tablet    . ferrous sulfate 325 (65 FE) MG tablet Take 1 tablet (325 mg total) by mouth 3 (three) times daily after meals.  3  . tiZANidine (ZANAFLEX) 4 MG tablet Take 1 tablet (4 mg total) by mouth every 6 (six) hours as needed for muscle spasms. 40 tablet 0   No facility-administered medications prior to visit.     PAST MEDICAL HISTORY: Past Medical History:  Diagnosis Date  . Allergic rhinitis   . Anxiety   . Arthritis   . Breast cancer ([place])    left breast cancer  . Complication of anesthesia    memory loss  . Concussion 2015   fell and  hit head  . Depression    situational depression  . Diverticulosis   . GERD (gastroesophageal reflux disease)   . Headache(784.0)    sinus HAs  . Hyperlipidemia   . Neuromuscular disorder ([facility])    neuropathy- nerve damage in neck causes nerve damage in toes    PAST SURGICAL HISTORY: Past Surgical History:  Procedure Laterality Date  . ABDOMINAL HYSTERECTOMY    . CHOLECYSTECTOMY N/A 03/04/2015   Procedure: LAPAROSCOPIC CHOLECYSTECTOMY WITH INTRAOPERATIVE CHOLANGIOGRAM;  Surgeon: Greer Pickerel, MD;  Location: [facility];  Service: General;  Laterality: N/A;  . ESOPHAGOGASTRODUODENOSCOPY N/A 08/15/2012   Procedure: ESOPHAGOGASTRODUODENOSCOPY (EGD);  Surgeon: Inda Castle, MD;  Location: [facility];  Service: Endoscopy;  Laterality: N/A;  . MASTECTOMY     left  . NASAL SINUS SURGERY     2004  . ORIF WRIST FRACTURE Right 08/21/2015   Procedure: OPEN REDUCTION INTERNAL FIXATION (ORIF) WRIST FRACTURE;  Surgeon: Dayna Barker, MD;  Location: [facility];  Service: Plastics;  Laterality: Right;  . REPLACEMENT TOTAL KNEE     left  . REVERSE SHOULDER ARTHROPLASTY Left 11/11/2014   Procedure: LEFT REVERSE SHOULDER ARTHROPLASTY;  Surgeon: Justice Britain, MD;  Location: [facility];  Service: Orthopedics;  Laterality: Left;  . SHOULDER SURGERY Bilateral    repairs, rot cuff repair  . STAPEDECTOMY Left    left ear  . TOTAL KNEE ARTHROPLASTY     right  . TOTAL KNEE ARTHROPLASTY Right 08/09/2015   Procedure: TOTAL KNEE ARTHROPLASTY;  Surgeon: Paralee Cancel, MD;  Location: [facility];  Service: Orthopedics;  Laterality: Right;    FAMILY HISTORY: Family History  Problem Relation Age of Onset  . Colon cancer Father 93  . Breast cancer Mother   . Leukemia Son   . Pancreatic cancer Sister     SOCIAL HISTORY:  Social History   Social History  . Marital status: Married    Spouse name: Doren Custard  . Number of children: 3  . Years of education: MBA   Occupational History  . Retired    Social History Main  Topics  . Smoking status: Former Smoker    Packs/day: 0.50    Years: 6.00    Types: Cigarettes    Quit date: 01/15/1958  . Smokeless tobacco: Never Used  . Alcohol use No  . Drug use: No  . Sexual activity: Not on file   Other Topics Concern  . Not on file   Social History Narrative   Patient lives at home with spouse.   Caffeine use: none   3 children; 1 deceased     PHYSICAL EXAM  Vitals:   10/31/15 0941  BP: 108/73  Pulse: 90  Weight: 150 lb 12.8 oz (68.4 kg)   Wt Readings from Last 3 Encounters:  10/31/15 150 lb 12.8 oz (68.4 kg)  08/20/15 154 lb (69.9 kg)  08/09/15 153 lb (69.4 kg)   Body mass index is 26.71 kg/m.  MMSE - Mini Mental State Exam 10/31/2015  Orientation to time 4  Orientation to Place 5  Registration 3  Attention/ Calculation 3  Recall 1  Language- name 2 objects 2  Language- repeat 0  Language- follow 3 step command 3  Language- read & follow direction 1  Write a sentence 1  Copy design 1  Total score 24   GENERAL EXAM: Patient is in no distress; well developed, nourished and groomed; neck is supple; MILD HIGH ARCH AND HAMMERTOES IN FEET  CARDIOVASCULAR: Regular rate and rhythm, no murmurs, no carotid bruits  NEUROLOGIC: MENTAL STATUS: awake, alert, language fluent, comprehension intact, naming intact, fund of knowledge appropriate CRANIAL NERVE: pupils equal and reactive to light, visual fields full to confrontation, extraocular muscles intact, no nystagmus, facial sensation and strength symmetric, hearing intact, palate elevates symmetrically, uvula midline, shoulder shrug symmetric, tongue midline. MOTOR: normal bulk and tone, full strength in the BUE, BLE; EXCEPT LEFT DF WEAKNESS SENSORY: DECR LT, TEMP IN FEET; ABSENT VIB AT KNEES, ANKLES AND TOES COORDINATION: finger-nose-finger, fine finger movements normal REFLEXES: deep tendon reflexes TRACE and symmetric GAIT/STATION: narrow based gait; SCUFFS FEET. SLOW UNSTEADY TURNING.  CANNOT TOE, HEEL OR TANDEM; UNSTEADY ON ROMBERG    DIAGNOSTIC DATA (LABS, IMAGING, TESTING) - I reviewed patient records, labs, notes, testing and imaging myself where available.  Lab Results  Component Value Date   WBC 11.9 (H) 08/20/2015   HGB 12.4 08/20/2015   HCT 38.7 08/20/2015  MCV 92.4 08/20/2015   PLT 326 08/20/2015      Component Value Date/Time   NA 136 08/20/2015 2205   K 4.3 08/20/2015 2205   CL 103 08/20/2015 2205   CO2 26 08/20/2015 2205   GLUCOSE 115 (H) 08/20/2015 2205   BUN 14 08/20/2015 2205   CREATININE 0.83 08/20/2015 2205   CALCIUM 8.9 08/20/2015 2205   PROT 6.4 (L) 02/25/2015 1047   PROT 6.6 01/20/2014 1237   ALBUMIN 3.5 02/25/2015 1047   AST 23 02/25/2015 1047   ALT 18 02/25/2015 1047   ALKPHOS 60 02/25/2015 1047   BILITOT 0.8 02/25/2015 1047   GFRNONAA >60 08/20/2015 2205   GFRAA >60 08/20/2015 2205   No results found for: CHOL, HDL, LDLCALC, LDLDIRECT, TRIG, CHOLHDL Lab Results  Component Value Date   HGBA1C 5.7 (H) 01/20/2014   Lab Results  Component Value Date   VITAMINB12 >1999 (H) 01/20/2014   Lab Results  Component Value Date   TSH 1.200 01/20/2014    01/02/14 CT head / maxillofacial 1. No acute intracranial pathology. 2. No acute osseous injury of the maxillofacial bones. 3. Mild generalized cerebral atrophy and chronic microvascular disease.  09/10/13 CTA head/neck 1. Negative for age CTA head and neck. 2. Stable CT appearance of the brain.  01/20/14 Neuropathy panel - negative  03/15/14 MRI lumbar spine demonstrating multilevel degenerative changes as detailed above. Specifically: 1. Moderate left foraminal narrowing at L1-L2 due to disc protrusion, endplate spurring and facet hypertrophy could lead to dynamic impingement of the left L1 nerve root  2. Moderately severe right lateral recess stenosis at L2-L3 due to disc protrusion, endplate spurring, mild facet hypertrophy leading to mild spinal stenosis. There could be  right L3 nerve root compression at this level. 3. Moderately to severe fairly narrowed neural foramina and mild to moderate lateral recess stenosis at L3-L4 due to disc protrusion, endplate spurring and facet hypertrophy causing mild spinal stenosis. There could be impingement of either of the exiting L3 nerve root with less potential for dynamic impingement of the L4 nerve roots. 4. Moderate bilateral foraminal narrowing at L4-L5 due to disc bulge and facet hypertrophy that could lead to dynamic impingement of the exiting L4 nerve roots   5. Severe right lateral recess stenosis and bilateral moderate foraminal narrowing at L5-S1 due to anterolisthesis, severe facet hypertrophy and right paramedian disc herniation. There appears to be compression of the right S1 nerve root and there could also be dynamic impingement of the exiting L5 nerve roots.  03/19/14 EMG/NCS  1. Severe length dependent axonal sensorimotor polyneuropathy. 2. Bilateral lower lumbar radiculopathies.     ASSESSMENT AND PLAN  82 y.o. year old female with postconcussion syndrome (fall on 11/19/13) with resultant scalp numbness/sensitivity, facial pain. Also with suspected underlying peripheral neuropathy for past 5-6 years, without clear etiology (possible hereditary neuropathy). Then second fall on Feb 19, 2014, with right facial trauma (no fractures) and right forearm fracture.   EMG and MRI lumbar spine confirm combination of severe axonal neuropathy + lumbar polyradiculopathy. I do not think patient would be a good surgical candidate at this time due to combination of neuropathy + lumbar spine disease, lack of back pain, advanced age, and relatively good improvement with conservative therapy. PT evaluation and exercises have significantly improved her balance and walking.   Also with short term memory loss,    Dx: idiopathic hereditary neuropathy + lumbar polyradiculopathy + post-traumatic headaches +  anxiety/insomnia  Memory loss  Anxiety state  PLAN: I spent 40 minutes of face to face time with patient. Greater than 50% of time was spent in counseling and coordination of care with patient. In summary we discussed:   - considered MRI brain --> cannot due to stapedectomy and metal in ears  - gradually increase physical activity / movement  - refer to neuropsychology evaluation  - reduce polypharmacy   Orders Placed This Encounter  Procedures  . Ambulatory referral to Neuropsychology   Return in about 6 months (around 04/30/2016).    Penni Bombard, MD [id number], [id number] AM Certified in Neurology, Neurophysiology and Neuroimaging  [facility] [address], [place], [place] [address] [phone]

## 2015-11-10 ENCOUNTER — Other Ambulatory Visit: Payer: Self-pay | Admitting: Diagnostic Neuroimaging

## 2015-11-10 NOTE — Telephone Encounter (Addendum)
Received call back from patient. She stated she thinks she is taking gabapentin, but her husband fills her pill boxes. He is not at home to speak with . She stated she would ask him and have him call today if he gets home within the next 30 min. Other wise she will have him call back tomorrow. She stated she would like to taper off the medication.  She verbalized understanding, appreciation for call.  Husband called back. He stated since her visit with Dr Leta Baptist 10/31/15 she has been taking gabapentin 300 mg once a day. He stated she received a refill on  09/27/15 and will have enough tablets to last for another month. He stated that they will "see how her headaches go" and call back to update this RN later. He verbalized understanding, appreciation for call.

## 2015-11-10 NOTE — Telephone Encounter (Signed)
Please ask patient if she is still needing this or whether she want to taper off. -VRP

## 2015-11-10 NOTE — Telephone Encounter (Signed)
LVM requesting call back re: medication refill. Left name, number.

## 2015-11-14 DIAGNOSIS — H43811 Vitreous degeneration, right eye: Secondary | ICD-10-CM | POA: Diagnosis not present

## 2015-11-29 ENCOUNTER — Ambulatory Visit (INDEPENDENT_AMBULATORY_CARE_PROVIDER_SITE_OTHER): Payer: Commercial Managed Care - HMO | Admitting: Psychology

## 2015-11-29 ENCOUNTER — Encounter: Payer: Self-pay | Admitting: Psychology

## 2015-11-29 DIAGNOSIS — F0781 Postconcussional syndrome: Secondary | ICD-10-CM

## 2015-11-29 DIAGNOSIS — R413 Other amnesia: Secondary | ICD-10-CM

## 2015-11-29 DIAGNOSIS — F411 Generalized anxiety disorder: Secondary | ICD-10-CM

## 2015-11-29 NOTE — Progress Notes (Signed)
NEUROPSYCHOLOGICAL INTERVIEW (CPT: [id number])  Name: [name]  Date of Birth: 16-Jun-1929 Date of Interview: 11/29/2015  Reason for Referral:  IYUNNA SCHLENDER (goes by [name]) is a 82 y.o. female who is referred for neuropsychological evaluation by Dr. Andrey Spearman of [facility] due to concerns about memory loss. This patient is accompanied in the office by her husband, Abbe Amsterdam, who supplements the history.  History of Presenting Problem:  Mrs. Hulen has been followed by Dr. Leta Baptist since 01/20/2014 after she sustained a concussion secondary to a fall in 11/2013. She had another fall in January 2016 with possible concussion. She was last seen by Dr. Leta Baptist on 10/31/2015 and scored 24/30 on the MMSE. At this most recent visit, she reported concerns about memory decline. The patient also has a history of headaches since her concussion and neuropathy.  At today's visit, the patient and her husband reported concerns about failing memory and inability to recall names. She has become concerned in the past year and her husband has noticed more in the past 6-8 months. He feels that the memory issues came on "pretty suddenly". She feels they're worsening over time. She has had 4 surgeries with anesthesia in one year. She and her husband denied any pronounced cognitive or behavioral change after she sustained the falls in late 2015 and early 2016. They reported that her headaches have been relatively minimal over the past 2 months so it was decided to reduce gabapentin. Unfortunately, she started having pain again this morning on the right side of her face where she fell in the past.  The patient reported that her memory retention used to be an asset but is now an area of significant weakness. She reported that she had to give up all 3 bridge clubs she was previously participating in because she could not remember what people had just said and she could not learn the new  conventions.  Upon direct questioning, the patient and her husband also reported the following:   Forgetting recent conversations/events: Yes, particularly things she has been told . More difficult to retrieve recent events. Repeating statements/questions: Yes, occasionally Misplacing/losing items: Yes Forgetting appointments or other obligations: No, because her husband helps. She thinks she might forget without Phil's help. She does have difficulty thinking ahead to upcoming appointments.  Forgetting to take medications: Phil prepares her pillboxes and reminds her to take them.   Difficulty concentrating: Yes but she says she has always had difficulty with this (e.g., daydreaming in school). Starting but not finishing tasks: No, if someone is depending on me, I finish it by a deadline. Her husband agrees she follows through on things quite well.   Word-finding difficulty: Yes. She has to talk around the word. Her husband notices this as well.  Writing difficulty: Making more errors Spelling difficulty: No Comprehension difficulty: She says she reads over things several times but that has always been the case, even back in school.  Getting lost when driving: She has not been able to drive because of neuropathy in her feet over past several months, but recently she is doing some minor driving when her husband is with her. Uncertain about directions when driving or passenger: She is becoming more confused about locations in [place], where roads and familiar places are located.   Her husband manages the finances. She reported she has never been good with numbers and therefore has never been responsible for the finances. Her husband also does the majority of the cooking  although when they're having company she will prepare main dishes.  The patient does note a lifelong history of anxiety. She has always been perfectionistic and somewhat rigid. She worries a lot about upcoming events and how  they will go. Her husband noted that she demonstrates new nervous habits -i.e., frequently picking fingernails. She takes Lorazepam PRN. She is also now taking Buspar (started 4-5 mos ago); it is unclear if this has been beneficial. Her husband says she gets "inordinately concerned" about things. She has difficulty sleeping because she is up worrying about things. She takes 1/3 of and Ambien tablet some nights. Her husband thinks she is sleeping reasonably well. She does take daytime naps sometimes.  The patient also reported significant grief and depression surrounding the loss of her son to leukemia almost 2 years ago. She reported, "it tore our lives apart, its the worst thing that has happened and my life." She did see a counselor through hospice for a while. She denied any prior history of depression before her son's death.  The patient reported that she feels she does not have the enthusiasm that she used to have. She reported that she is more passive. She also reported reduced energy. About 3 months ago, her husband reported that she was eating very little. However her appetite has recently increased and she is eating normal meals again.  Mrs. Wainer reported that she used to enjoy an evening cocktail but since her concussion and she has developed headaches when she drinks alcohol. Due to this, she quit drinking. Seemingly associated with her quitting drinking, she lost 33 pounds.   Social History: Born/Raised: [place] Education: Bachelor's degree in Network engineer, Master's in guidance counseling. 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 She was a homemaker when her children were young and in school. She did substitute teaching. For most of her daughter years, she dedicated herself to PTA, volunteering and various community organizations. Marital history: Married since 26. They  had 3 children; one is now deceased.    Medical History: Past Medical History:  Diagnosis Date  . Allergic rhinitis   . Anxiety   . Arthritis   . Breast cancer ([place])    left breast cancer  . Complication of anesthesia    memory loss  . Concussion 2015   fell and hit head  . Depression    situational depression  . Diverticulosis   . GERD (gastroesophageal reflux disease)   . Headache(784.0)    sinus HAs  . Hyperlipidemia   . Neuromuscular disorder ([facility])    neuropathy- nerve damage in neck causes nerve damage in toes   Breast cancer was in 1968. Mastectomy.   Current Medications:  Outpatient Encounter Prescriptions as of 11/29/2015  Medication Sig  . acetaminophen (TYLENOL) 325 MG tablet Take 2 tablets (650 mg total) by mouth every 6 (six) hours as needed for mild pain, moderate pain or fever.  Marland Kitchen aspirin 81 MG chewable tablet Chew by mouth daily.  . busPIRone (BUSPAR) 10 MG tablet Take 10 mg by mouth 2 (two) times daily.  . Cholecalciferol 2000 UNITS TABS Take 2,000 Units by mouth at bedtime.  . docusate sodium (COLACE) 100 MG capsule Take 1 capsule (100 mg total) by mouth 2 (two) times daily. (Patient taking differently: Take 100 mg by mouth 2 (two) times daily as needed for mild constipation. )  . esomeprazole (NEXIUM) 20 MG  capsule Take 20 mg by mouth every evening.  . fexofenadine (ALLEGRA) 180 MG tablet Take 180 mg by mouth every other day.   . gabapentin (NEURONTIN) 300 MG capsule Take 1 capsule (300 mg total) by mouth 2 (two) times daily.  Marland Kitchen LORazepam (ATIVAN) 0.5 MG tablet Take 0.5 mg by mouth every 8 (eight) hours as needed for anxiety.  . Multiple Vitamin (MULTIVITAMIN WITH MINERALS) TABS tablet Take 1 tablet by mouth daily.  . nortriptyline (PAMELOR) 25 MG capsule Take 25 mg by mouth at bedtime.  . Omega-3 Fatty Acids (FISH OIL) 1200 MG CAPS Take 1,200 mg by mouth 2 (two) times daily.  . polyethylene glycol (MIRALAX / GLYCOLAX) packet Take 17 g by mouth 2 (two)  times daily. (Patient taking differently: Take 17 g by mouth daily as needed for mild constipation. )  . raloxifene (EVISTA) 60 MG tablet Take 60 mg by mouth daily.  . traMADol (ULTRAM) 50 MG tablet Take 1-2 tablets (50-100 mg total) by mouth every 6 (six) hours as needed. (Patient taking differently: Take 50-100 mg by mouth every 6 (six) hours as needed for moderate pain. )  . vitamin B-12 (CYANOCOBALAMIN) 1000 MCG tablet Take 1,000 mcg by mouth daily.  Marland Kitchen zolpidem (AMBIEN) 10 MG tablet Take 3.33 mg by mouth at bedtime as needed for sleep. 1/3 tablet   No facility-administered encounter medications on file as of 11/29/2015.      Behavioral Observations:   Appearance: Neatly and appropriately dressed and groomed Gait: Ambulated independently, no abnormalities observed Speech: Fluent; normal rate, rhythm and volume. Mild word finding. Thought process: Mildly tangential Affect: Full, appropriate to context, mildly anxious Interpersonal: Very pleasant, appropriate, engaged   TESTING: There is medical necessity to proceed with neuropsychological assessment as the results will be used to aid in differential diagnosis and clinical decision-making and to inform specific treatment recommendations. Per the patient, her husband and medical records reviewed, there has been a change in cognitive functioning and a reasonable suspicion of dementia.   PLAN: The patient will return for a full battery of neuropsychological testing with a psychometrician under my supervision. Education regarding testing procedures was provided. Subsequently, the patient will see this provider for a follow-up session at which time her test performances and my impressions and treatment recommendations will be reviewed in detail.   Full neuropsychological evaluation report to follow.

## 2015-12-01 ENCOUNTER — Telehealth: Payer: Self-pay | Admitting: Diagnostic Neuroimaging

## 2015-12-01 MED ORDER — GABAPENTIN 300 MG PO CAPS: 300.0000 mg | ORAL_CAPSULE | Freq: Two times a day (BID) | ORAL | 0 refills | Status: DC

## 2015-12-01 NOTE — Telephone Encounter (Signed)
Spouse called to request renewal/refill of gabapentin (NEURONTIN) 300 MG capsule

## 2015-12-01 NOTE — Telephone Encounter (Signed)
One month refill of gabapentin sent in.

## 2015-12-07 MED ORDER — GABAPENTIN 300 MG PO CAPS: 300.0000 mg | ORAL_CAPSULE | Freq: Two times a day (BID) | ORAL | 0 refills | Status: DC

## 2015-12-07 NOTE — Telephone Encounter (Signed)
Pt request refill to be sent to [facility] mail order 90 day supply. He will pick up 30 day supply locally. Pt said he requested it to be sent to [facility] when he called on 11/16.

## 2015-12-07 NOTE — Addendum Note (Signed)
Addended by: France Ravens I on: 12/07/2015 12:01 PM   Modules accepted: Orders

## 2015-12-07 NOTE — Telephone Encounter (Signed)
Rx. sent to [facility] as requested/fim

## 2015-12-12 ENCOUNTER — Ambulatory Visit (INDEPENDENT_AMBULATORY_CARE_PROVIDER_SITE_OTHER): Payer: Commercial Managed Care - HMO | Admitting: Psychology

## 2015-12-12 DIAGNOSIS — R413 Other amnesia: Secondary | ICD-10-CM

## 2015-12-12 NOTE — Progress Notes (Signed)
   Neuropsychology Note  [name] returned today for 2 hours of neuropsychological testing with technician, Milana Kidney, BS, under the supervision of Dr. Macarthur Critchley. The patient did not appear overtly distressed by the testing session, per behavioral observation or via self-report to the technician. Rest breaks were offered. [name] will return within 2 weeks for a feedback session with Dr. Si Raider at which time her test performances, clinical impressions and treatment recommendations will be reviewed in detail. The patient understands she can contact our office should she require our assistance before this time.  Full report to follow.

## 2015-12-15 DIAGNOSIS — E038 Other specified hypothyroidism: Secondary | ICD-10-CM | POA: Diagnosis not present

## 2015-12-15 DIAGNOSIS — F418 Other specified anxiety disorders: Secondary | ICD-10-CM | POA: Diagnosis not present

## 2015-12-15 DIAGNOSIS — R413 Other amnesia: Secondary | ICD-10-CM | POA: Diagnosis not present

## 2015-12-15 DIAGNOSIS — M1990 Unspecified osteoarthritis, unspecified site: Secondary | ICD-10-CM | POA: Diagnosis not present

## 2015-12-15 DIAGNOSIS — R2689 Other abnormalities of gait and mobility: Secondary | ICD-10-CM | POA: Diagnosis not present

## 2015-12-15 DIAGNOSIS — Z1389 Encounter for screening for other disorder: Secondary | ICD-10-CM | POA: Diagnosis not present

## 2015-12-15 DIAGNOSIS — Z6826 Body mass index (BMI) 26.0-26.9, adult: Secondary | ICD-10-CM | POA: Diagnosis not present

## 2015-12-18 NOTE — Progress Notes (Signed)
NEUROPSYCHOLOGICAL EVALUATION   Name:    [name]  Date of Birth:   12-29-1929 Date of Interview:  11/29/2015 Date of Testing:  12/12/2015   Date of Feedback:  12/19/2015       Background Information:  Reason for Referral:  [name] (goes by "[name]") is a 82 y.o. female referred by Dr. Andrey Spearman of [facility] to assess her current level of cognitive functioning and assist in differential diagnosis. The current evaluation consisted of a review of available medical records, an interview with the patient and her husband, Abbe Amsterdam, and the completion of a neuropsychological testing battery. Informed consent was obtained.  History of Presenting Problem:  [name] has been followed by Dr. Leta Baptist since 01/20/2014 after she sustained a concussion secondary to a fall in 11/2013. She had another fall in January 2016 with possible concussion. She was last seen by Dr. Leta Baptist on 10/31/2015 and scored 24/30 on the MMSE. At this most recent visit, she reported concerns about memory decline. The patient also has a history of neuropathy and post-concussion headaches.  At today's visit, the patient and her husband reported concerns about failing memory and inability to recall names. She has become concerned in the past year and her husband has noticed more in the past 6-8 months. He feels that the memory issues came on "pretty suddenly". She feels they're worsening over time. She has had 4 surgeries with anesthesia in one year. She and her husband denied any pronounced cognitive or behavioral change after she sustained the falls in late 2015 and early 2016. They reported that her headaches have been relatively minimal over the past 2 months so it was decided to reduce gabapentin. Unfortunately, she started having pain again this morning on the right side of her face where she fell in the past.  The patient reported that her memory retention used to be an  asset but is now an area of significant weakness. 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  Upon direct questioning, the patient and her husband also reported the following:   Forgetting recent conversations/events: Yes, particularly things she has been told . More difficult to retrieve recent events. Repeating statements/questions: Yes, occasionally Misplacing/losing items: Yes Forgetting appointments or other obligations: No, because her husband helps. She thinks she might forget without Phil's help. She does have difficulty thinking ahead to upcoming appointments.  Forgetting to take medications: Phil prepares her pillboxes and reminds her to take them.   Difficulty concentrating: Yes but she says she has always had difficulty with this (e.g., daydreaming in school). Starting but not finishing tasks: No, if someone is depending on me, I finish it by a deadline. Her husband agrees she follows through on things quite well.   Word-finding difficulty: Yes. She has to talk around the word. Her husband notices this as well.  Writing difficulty: Making more errors Spelling difficulty: No Comprehension difficulty: She says she reads over things several times but that has always been the case, even back in school.  Getting lost when driving: She has not been able to drive because of neuropathy in her feet over past several months, but recently she is doing some "minor driving" when her husband is with her. Uncertain about directions when driving or passenger: She is becoming more confused about  locations in [place], where roads and familiar places are located.   Her husband manages the finances. She reported she has never been good with numbers and therefore has never been responsible for the finances. Her husband also does the majority  of the cooking although when they're having company she will prepare main dishes.  The patient does note a lifelong history of anxiety. She has always been perfectionistic and somewhat rigid. She worries a lot about upcoming events and how they will go. Her husband noted that she demonstrates new nervous habits -i.e., frequently picking fingernails. She takes Lorazepam PRN. She is also now taking Buspar (started 4-5 mos ago); it is unclear if this has been beneficial. The dosage of Buspar was recently increased. Her husband says she gets "inordinately concerned" about things. She has difficulty sleeping because she is up worrying about things. She takes 1/3 of and Ambien tablet some nights. Her husband thinks she is sleeping reasonably well. She does take daytime naps sometimes.  The patient also reported significant grief and depression surrounding the loss of her son to leukemia almost 2 years ago. She reported, "It tore our lives apart, its the worst thing that has happened and my life." She did see a counselor through hospice for a while. She denied any prior history of depression before her son's death.  The patient reported that she feels she does not have the enthusiasm that she used to have. She reported that she is more passive. She also reported reduced energy. About 3 months ago, her husband reported that she was eating very little. However her appetite has recently increased and she is eating normal meals again.  [name] reported that she used to enjoy an evening cocktail but since her concussion and she has developed headaches when she drinks alcohol. Due to this, she quit drinking. Seemingly associated with her quitting drinking, she lost 33 pounds.  Most recent neuroimaging on file is a CT of the head from 04/08/2015. This reportedly revealed:  1.    Mild cortical atrophy, unchanged when compared to the 01/02/2014 CT scan. The extent of atrophy is typical for age. 2.    Mild to  moderate chronic microvascular ischemic changes, essentially unchanged when compared to the prior scan. 3.    There are no acute findings.    Social History: Born/Raised: [place] Education: Bachelor's degree in Network engineer, Master's in guidance counseling. 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 She was a homemaker when her children were young and in school. She did substitute teaching. For most of her adult years, she dedicated herself to PTA, volunteering and various community organizations. Marital history: Married since 29. They had 3 children; one is now deceased.   Medical History:  Past Medical History:  Diagnosis Date  . Allergic rhinitis   . Anxiety   . Arthritis   . Breast cancer ([place])    left breast cancer  . Complication of anesthesia    memory loss  . Concussion 2015   fell and hit head  . Depression    situational depression  . Diverticulosis   . GERD (gastroesophageal reflux disease)   . Headache(784.0)    sinus HAs  . Hyperlipidemia   . Neuromuscular disorder (HCC)    neuropathy- nerve damage in neck causes nerve damage in toes   Breast cancer was in 1968. Mastectomy.  Current medications:  Outpatient  Encounter Prescriptions as of 12/19/2015  Medication Sig  . acetaminophen (TYLENOL) 325 MG tablet Take 2 tablets (650 mg total) by mouth every 6 (six) hours as needed for mild pain, moderate pain or fever.  Marland Kitchen aspirin 81 MG chewable tablet Chew by mouth daily.  . busPIRone (BUSPAR) 10 MG tablet Take 10 mg by mouth 2 (two) times daily.  . Cholecalciferol 2000 UNITS TABS Take 2,000 Units by mouth at bedtime.  . docusate sodium (COLACE) 100 MG capsule Take 1 capsule (100 mg total) by mouth 2 (two) times daily. (Patient taking differently: Take 100 mg by mouth 2 (two) times daily as needed for mild constipation. )  . esomeprazole (NEXIUM) 20 MG  capsule Take 20 mg by mouth every evening.  . fexofenadine (ALLEGRA) 180 MG tablet Take 180 mg by mouth every other day.   . gabapentin (NEURONTIN) 300 MG capsule Take 1 capsule (300 mg total) by mouth 2 (two) times daily.  Marland Kitchen LORazepam (ATIVAN) 0.5 MG tablet Take 0.5 mg by mouth every 8 (eight) hours as needed for anxiety.  . Multiple Vitamin (MULTIVITAMIN WITH MINERALS) TABS tablet Take 1 tablet by mouth daily.  . nortriptyline (PAMELOR) 25 MG capsule Take 25 mg by mouth at bedtime.  . Omega-3 Fatty Acids (FISH OIL) 1200 MG CAPS Take 1,200 mg by mouth 2 (two) times daily.  . polyethylene glycol (MIRALAX / GLYCOLAX) packet Take 17 g by mouth 2 (two) times daily. (Patient taking differently: Take 17 g by mouth daily as needed for mild constipation. )  . raloxifene (EVISTA) 60 MG tablet Take 60 mg by mouth daily.  . traMADol (ULTRAM) 50 MG tablet Take 1-2 tablets (50-100 mg total) by mouth every 6 (six) hours as needed. (Patient taking differently: Take 50-100 mg by mouth every 6 (six) hours as needed for moderate pain. )  . vitamin B-12 (CYANOCOBALAMIN) 1000 MCG tablet Take 1,000 mcg by mouth daily.  Marland Kitchen zolpidem (AMBIEN) 10 MG tablet Take 3.33 mg by mouth at bedtime as needed for sleep. 1/3 tablet   No facility-administered encounter medications on file as of 12/19/2015.     Current Examination:  Behavioral Observations:  Appearance: Neatly and appropriately dressed and groomed Gait: Ambulated independently, no abnormalities observed Speech: Fluent; normal rate, rhythm and volume. Mild word finding. Thought process: Mildly tangential Attention: Difficulty staying on task during the testing session, needed redirecting Affect: Full, appropriate to context, mildly anxious Interpersonal: Very pleasant, appropriate, engaged Orientation: Oriented to person, place and most aspects of time (off on the date by one day). Accurately named the current President and his predecessor.  Tests  Administered: . Test of Premorbid Functioning (TOPF) . Wechsler Adult Intelligence Scale-Fourth Edition (WAIS-IV): Block Design, Similarities, Matrix Reasoning, and Digit Span subtests . [employer] Verbal Learning Test - 2nd Edition (CVLT-2) Short Form . Repeatable Battery for the Assessment of Neuropsychological Status (RBANS) Form A:  Figure Copy and Recall subtests, Story Memory and Recall subtests, and Coding subtest . Neuropsychological Assessment Battery (NAB) Language Module, Form 1: Naming Subtest . Controlled Oral Word Association Test (COWAT) . Trail Making Test A and B . Clock drawing test . Boston Diagnostic Aphasia Examination:  Complex Ideational Material Subtest . Geriatric Depression Scale - 15 item (GDS) . Generalized Anxiety Disorder - 7 item screener (GAD-7)  Test Results: Note: Standardized scores are presented only for use by appropriately trained professionals and to allow for any future test-retest comparison. These scores should not be interpreted without consideration of all the information  that is contained in the rest of the report. The most recent standardization samples from the test publisher or other sources were used whenever possible to derive standard scores; scores were corrected for age, gender, ethnicity and education when available.   Test Scores:  Test Name Raw Score Standardized Score Descriptor  TOPF 39/70 SS= 100 Average  WAIS-IV Subtests     Block Design 24/66 ss= 10 Average  Similarities 20/36 ss= 9 Average  Matrix Reasoning 9/26 ss= 10 Average  Digit Span Forward 10/16 ss= 11 Average  Digit Span Backward 8/16 ss= 11 Average  RBANS Subtests     Figure Copy 14/20 Z= -1.7 Borderline  Figure Recall 1/20 Z= -2.5 Impaired  Story Memory 9/24 Z= -1.6 Borderline  Story Recall 2/12 Z= -1.9 Borderline  Coding 18/89 Z= -2.4 Impaired  CVLT-II Scores     Trial 1 4/9 Z= -1 Low average  Trial 4 5/9 Z= -1.5 Borderline  Trials 1-4 total 18/36 T= 36  Borderline  SD Free Recall 3/9 Z= -2 Impaired  LD Free Recall 3/9 Z= -1.5 Borderline  LD Cued Recall 1/9 Z= -3 Severely impaired  Recognition Discriminability 8/9 hits, 4 false positives Z= -0.5 Average  Forced Choice Recognition 9/9  WNL  NAB Naming 24/31 T= 30 Impaired  COWAT-FAS 32 T= 46 Average  COWAT-Animals 12 T= 40 Low average  Trail Making Test A 55" 0 errors T= 52 Average  Trail Making Test B  Discontinued at 500" on letter H 5 errors  Impaired  Clock Drawing   Impaired  BDAE Complex Ideational Material 8/12  Impaired  GDS  6/15 Mild  GAD-7  7/21 Mild      Description of Test Results:  Premorbid verbal intellectual abilities were estimated to have been within the average range based on a test of word reading. Psychomotor processing speed was impaired. Auditory attention and working memory were average. Visual-spatial construction was variable. Construction of three dimensional blocks to match a model was average while her drawn copy of a complex geometric figure was borderline impaired. Regarding the latter, she omitted some aspects of the figure. Language abilities were below expectation. Specifically, confrontation naming was impaired, and semantic verbal fluency was low average. Auditory comprehension of complex ideational material was impaired. With regard to verbal memory, encoding and acquisition of non-contextual information (i.e., word list) was borderline impaired across four learning trials. After a brief distracter task, free recall was impaired. After a 10 minute delay, free recall was impaired. She did not benefit from semantic cueing and in fact cued recall was severely impaired. Performance on a yes/no recognition task was average overall although she did commit an elevated number of false positive errors. On another verbal memory test, encoding and acquisition of contextual auditory information (i.e., short story) was borderline impaired. After a delay, free recall  was borderline impaired. With regard to non-verbal memory, delayed free recall of visual information was impaired. Executive functioning was variable. Mental flexibility and set-shifting were severely impaired on Trails B. Verbal fluency with phonemic search restrictions was average. Verbal abstract reasoning was average. Non-verbal abstract reasoning was average. Performance on a clock drawing task was impaired. On a self-report questionnaire of mood, the patient's responses were  indicative of mild depression at the present time. Symptoms endorsed included: dropping activities/interests, boredom, not being in good spirits most of the time, and reduced energy. On another self-report measure, she endorsed mild anxiety characterized by inability to stop or control worrying, difficulty relaxing, nervousness, worrying too  much about different things, and irritability.   Clinical Impressions: Mild dementia, unspecified (rule out Alzheimer's disease). Generalized anxiety disorder. Results of cognitive testing were abnormal; specifically, there were several areas of impairment, including psychomotor processing speed, confrontation naming, auditory comprehension, encoding and retrieval of both verbal and non-verbal information, mental flexibility and set-shifting, and clock drawing. Furthermore, there is evidence that her cognitive deficits are interfering with her ability to manage complex ADLs such as medications, appointments and driving. As such, diagnostic criteria for a dementia syndrome are met. The patient's cognitive profile does not clearly reflect cortical versus subcortical features, and she may have both cortical and subcortical involvement. Based on her husband's report of course of symptoms, these deficits do not appear to be a direct result of her concussions in 2015/2016. I suspect there may be an underlying neurodegenerative disorder such as Alzheimer's disease that may have been exacerbated by  multiple surgeries with anesthesia. Vascular dementia, due to chronic small vessel ischemic changes, could also be possible. The patient also has a history of generalized anxiety disorder and continues to demonstrate symptoms of this disorder.   Recommendations: Based on the findings of the present evaluation, the following recommendations are offered:  1. The patient may be an appropriate candidate for cholinesterase inhibitor therapy. She will follow up with Dr. Leta Baptist to discuss this further. 2. The patient should continue to receive assistance with complex ADLs, including medications, appointments, traveling and finances. Given her poor performance on a cognitive test highly correlated with driving ability, it is my recommendation that she not return to driving.  3. Education regarding mild dementia was provided to the patient and her husband.  4. The patient should continue to participate in activities which provide mental stimulation, safe cardiovascular exercise, and social interaction. 5. An updated scan of the brain could be considered given the patient's husband report of relatively sudden onset of cognitive symptoms. However, I suspect we will see chronic rather than acute findings (e.g., microvascular ischemic changes, possibly more atrophy).  6. Continue to monitor anxiety level. Buspar was recently increased and I am hopeful this will be beneficial.  7. Neuropsychological re-assessment in one year is recommended in order to monitor cognitive status, track progression of symptoms and further assist with treatment planning.   Feedback to Patient: DEREKA LUERAS and her husband returned for a feedback appointment on 12/19/2015 to review the results of her neuropsychological evaluation with this provider. 60 minutes face-to-face time was spent reviewing her test results, my impressions and my recommendations as detailed above.    Total time spent on this patient's case: [id number]  unit for interview with psychologist; [phone] units of testing by psychometrician under psychologist's supervision; [phone] units for medical record review, scoring of neuropsychological tests, interpretation of test results, preparation of this report, and review of results to the patient by psychologist.      Thank you for your referral of NAMYA VOGES. Please feel free to contact me if you have any questions or concerns regarding this report.

## 2015-12-19 ENCOUNTER — Ambulatory Visit (INDEPENDENT_AMBULATORY_CARE_PROVIDER_SITE_OTHER): Payer: Commercial Managed Care - HMO | Admitting: Psychology

## 2015-12-19 ENCOUNTER — Encounter: Payer: Self-pay | Admitting: Psychology

## 2015-12-19 DIAGNOSIS — F0390 Unspecified dementia without behavioral disturbance: Secondary | ICD-10-CM | POA: Diagnosis not present

## 2015-12-19 DIAGNOSIS — F411 Generalized anxiety disorder: Secondary | ICD-10-CM

## 2015-12-19 DIAGNOSIS — F03A0 Unspecified dementia, mild, without behavioral disturbance, psychotic disturbance, mood disturbance, and anxiety: Secondary | ICD-10-CM

## 2015-12-26 ENCOUNTER — Encounter: Payer: Self-pay | Admitting: Psychology

## 2015-12-26 ENCOUNTER — Telehealth: Payer: Self-pay | Admitting: Psychology

## 2015-12-26 NOTE — Telephone Encounter (Signed)
[name] August 29, 2029. Her # [phone]. Her Husband called needing to get another copy of [name]'s paper work from her last visit with you. They seemed to have misplaced it. Thank you

## 2015-12-26 NOTE — Progress Notes (Signed)
Mailed copies to patient today, and sent via MyChart.

## 2016-01-04 ENCOUNTER — Other Ambulatory Visit: Payer: Self-pay | Admitting: Diagnostic Neuroimaging

## 2016-01-17 DIAGNOSIS — M810 Age-related osteoporosis without current pathological fracture: Secondary | ICD-10-CM | POA: Diagnosis not present

## 2016-01-17 DIAGNOSIS — E784 Other hyperlipidemia: Secondary | ICD-10-CM | POA: Diagnosis not present

## 2016-01-17 DIAGNOSIS — E038 Other specified hypothyroidism: Secondary | ICD-10-CM | POA: Diagnosis not present

## 2016-01-17 DIAGNOSIS — R8299 Other abnormal findings in urine: Secondary | ICD-10-CM | POA: Diagnosis not present

## 2016-01-19 DIAGNOSIS — Z6825 Body mass index (BMI) 25.0-25.9, adult: Secondary | ICD-10-CM | POA: Diagnosis not present

## 2016-01-19 DIAGNOSIS — G4700 Insomnia, unspecified: Secondary | ICD-10-CM | POA: Diagnosis not present

## 2016-01-19 DIAGNOSIS — A084 Viral intestinal infection, unspecified: Secondary | ICD-10-CM | POA: Diagnosis not present

## 2016-01-19 DIAGNOSIS — R5383 Other fatigue: Secondary | ICD-10-CM | POA: Diagnosis not present

## 2016-01-21 DIAGNOSIS — S0990XA Unspecified injury of head, initial encounter: Secondary | ICD-10-CM | POA: Diagnosis not present

## 2016-01-24 DIAGNOSIS — Z6826 Body mass index (BMI) 26.0-26.9, adult: Secondary | ICD-10-CM | POA: Diagnosis not present

## 2016-01-24 DIAGNOSIS — Z1389 Encounter for screening for other disorder: Secondary | ICD-10-CM | POA: Diagnosis not present

## 2016-01-24 DIAGNOSIS — Z0000 Encounter for general adult medical examination without abnormal findings: Secondary | ICD-10-CM | POA: Diagnosis not present

## 2016-01-26 DIAGNOSIS — H43811 Vitreous degeneration, right eye: Secondary | ICD-10-CM | POA: Diagnosis not present

## 2016-01-26 DIAGNOSIS — H524 Presbyopia: Secondary | ICD-10-CM | POA: Diagnosis not present

## 2016-02-16 DIAGNOSIS — M25551 Pain in right hip: Secondary | ICD-10-CM | POA: Diagnosis not present

## 2016-02-16 DIAGNOSIS — Z96651 Presence of right artificial knee joint: Secondary | ICD-10-CM | POA: Diagnosis not present

## 2016-02-16 DIAGNOSIS — M7061 Trochanteric bursitis, right hip: Secondary | ICD-10-CM | POA: Diagnosis not present

## 2016-02-16 DIAGNOSIS — Z471 Aftercare following joint replacement surgery: Secondary | ICD-10-CM | POA: Diagnosis not present

## 2016-02-20 DIAGNOSIS — M7061 Trochanteric bursitis, right hip: Secondary | ICD-10-CM | POA: Diagnosis not present

## 2016-02-23 DIAGNOSIS — M7061 Trochanteric bursitis, right hip: Secondary | ICD-10-CM | POA: Diagnosis not present

## 2016-02-28 DIAGNOSIS — M7061 Trochanteric bursitis, right hip: Secondary | ICD-10-CM | POA: Diagnosis not present

## 2016-03-01 DIAGNOSIS — M7061 Trochanteric bursitis, right hip: Secondary | ICD-10-CM | POA: Diagnosis not present

## 2016-03-06 DIAGNOSIS — M7061 Trochanteric bursitis, right hip: Secondary | ICD-10-CM | POA: Diagnosis not present

## 2016-03-08 DIAGNOSIS — M7061 Trochanteric bursitis, right hip: Secondary | ICD-10-CM | POA: Diagnosis not present

## 2016-03-12 DIAGNOSIS — M7061 Trochanteric bursitis, right hip: Secondary | ICD-10-CM | POA: Diagnosis not present

## 2016-03-12 DIAGNOSIS — M25661 Stiffness of right knee, not elsewhere classified: Secondary | ICD-10-CM | POA: Diagnosis not present

## 2016-03-12 DIAGNOSIS — Z471 Aftercare following joint replacement surgery: Secondary | ICD-10-CM | POA: Diagnosis not present

## 2016-03-12 DIAGNOSIS — Z96651 Presence of right artificial knee joint: Secondary | ICD-10-CM | POA: Diagnosis not present

## 2016-03-22 ENCOUNTER — Telehealth: Payer: Self-pay | Admitting: *Deleted

## 2016-03-22 NOTE — Telephone Encounter (Signed)
Received call from patient's husband requesting to be seen sooner than April. He stated she continues to have some headaches and wants to discuss Gabapentin and possible side effects. Rescheduled FU for next Monday. He verbalized understanding, appreciation.

## 2016-03-26 ENCOUNTER — Ambulatory Visit (INDEPENDENT_AMBULATORY_CARE_PROVIDER_SITE_OTHER): Payer: Medicare HMO | Admitting: Diagnostic Neuroimaging

## 2016-03-26 ENCOUNTER — Encounter: Payer: Self-pay | Admitting: Diagnostic Neuroimaging

## 2016-03-26 VITALS — BP 102/68 | HR 87 | Wt 150.0 lb

## 2016-03-26 DIAGNOSIS — F411 Generalized anxiety disorder: Secondary | ICD-10-CM | POA: Diagnosis not present

## 2016-03-26 DIAGNOSIS — F0781 Postconcussional syndrome: Secondary | ICD-10-CM | POA: Diagnosis not present

## 2016-03-26 DIAGNOSIS — M5416 Radiculopathy, lumbar region: Secondary | ICD-10-CM | POA: Diagnosis not present

## 2016-03-26 DIAGNOSIS — R413 Other amnesia: Secondary | ICD-10-CM

## 2016-03-26 DIAGNOSIS — F0390 Unspecified dementia without behavioral disturbance: Secondary | ICD-10-CM

## 2016-03-26 DIAGNOSIS — G609 Hereditary and idiopathic neuropathy, unspecified: Secondary | ICD-10-CM

## 2016-03-26 DIAGNOSIS — F03A0 Unspecified dementia, mild, without behavioral disturbance, psychotic disturbance, mood disturbance, and anxiety: Secondary | ICD-10-CM

## 2016-03-26 NOTE — Progress Notes (Signed)
[facility]  PATIENT: [name] DOB: 09-13-1929  REFERRING CLINICIAN: HISTORY FROM: patient  REASON FOR VISIT: follow up    HISTORICAL  CHIEF COMPLAINT:  Chief Complaint  Patient presents with  . Memory Loss    rm , husbandAbbe [place], MMSE 23, "recurring headaches/head movement"  . Follow-up    6 month    HISTORY OF PRESENT ILLNESS:  UPDATE 03/26/16: Since last visit, patient continues with memory loss issues. Some intermittent headaches, sometimes triggered by head movement, sometimes triggered by altitude (visiting [place]). Continues with anxiety issues. Some trip and falls.  UPDATE 10/31/15: Since last visit, patient now complaining of increasing memory problems. High stress levels, increased worry, poor sleep. Needs 1/3 tab ambien per night.   UPDATE 03/29/15: Since last visit, was doing well, then had episode of standing up --> then had sudden discomfort in head, prickly feeling, hot feeling, dizzy. Then next day had dizziness with head turned to the right. Then sometimes with head left. Now sxs slightly eased. Still with insomnia (interrupted sleep), daytime fatigue, grieving son's death in 11/04/2013. Goes to sleep at 10pm. Then takes small ambien every night after midnight when she might wake up.   UPDATE 01/20/15: Since last visit, was stable, until Dec 2016, then more headaches. Now HA are stabilized. Some more insomnia, anxiety issues, reports still grieving re: son's death in 10/04/2013.   UPDATE May 19, 2014: Since last vist doing much better with PT evaluation and exercises ([facility]). She is pleased with progress. She feels stronger and and safer with her balance.  UPDATE 03/08/14: Since last visit, patient was traveling overseas to [place] when she tripped and fell on a concrete slab, falling on her right face and right wrist. Patient suffered significant right periorbital ecchymosis, facial bruising, as well as right forearm fracture.  Patient continues to have pain in her right head, headaches, balance difficulty. Patient follows up to further investigate neuropathy.  PRIOR HPI (01/20/14): 83 year old right-handed female with hypercholesterolemia, breast cancer, here for evaluation of right scalp and facial discomfort, gait difficulty, postconcussion syndrome. 11/18/2013 patient tripped on her driveway, fell and possibly hit her car in the driveway. She then bounced backwards and landed on her left hip. She had significant left hip pain but didn't feel any had pain initially. After going inside should be developed dizziness clamminess, possible low blood pressure and low pulse rate, and went to the emergency room. Patient was diagnosed with concussion. Since that time she has continued discomfort in her right scalp, especially when her hair is minimally dilated. She has pain that radiates down to her right facial region. Patient has had ongoing intermittent pain, went to the emergency room second time, diagnosed with possible trigeminal neuralgia. Also with remote shingles in the right frontal V1 region many years ago. Patient also having progressive gait and balance difficulty, with at least 5-6 years of numbness and tingling in the toes. Apparently she has been diagnosed with neuropathy, but without specific cause.   REVIEW OF SYSTEMS: Full 14 system review of systems performed and negative except for: fatigue eye discharge thirst incontinence headache numbness anxiety.    ALLERGIES: Allergies  Allergen Reactions  . Dust Mite Extract Other (See Comments)    Sinus drainage  . Other Other (See Comments)    Smoke, Cat Dander cause sinus drainage  . Norco [Hydrocodone-Acetaminophen] Other (See Comments)    Hallucinations     HOME MEDICATIONS: Outpatient Medications Prior to Visit  Medication  Sig Dispense Refill  . acetaminophen (TYLENOL) 325 MG tablet Take 2 tablets (650 mg total) by mouth every 6 (six) hours as needed for mild  pain, moderate pain or fever.    Marland Kitchen aspirin 81 MG chewable tablet Chew by mouth daily.    . busPIRone (BUSPAR) 10 MG tablet Take 10 mg by mouth 2 (two) times daily.  4  . Cholecalciferol 2000 UNITS TABS Take 2,000 Units by mouth at bedtime.    Marland Kitchen esomeprazole (NEXIUM) 20 MG capsule Take 20 mg by mouth every evening.    . fexofenadine (ALLEGRA) 180 MG tablet Take 180 mg by mouth every other day.     . gabapentin (NEURONTIN) 300 MG capsule Take 1 capsule (300 mg total) by mouth 2 (two) times daily. 180 capsule 3  . LORazepam (ATIVAN) 0.5 MG tablet Take 0.5 mg by mouth every 8 (eight) hours as needed for anxiety.    . Multiple Vitamin (MULTIVITAMIN WITH MINERALS) TABS tablet Take 1 tablet by mouth daily.    . nortriptyline (PAMELOR) 25 MG capsule Take 25 mg by mouth at bedtime.    . Omega-3 Fatty Acids (FISH OIL) 1200 MG CAPS Take 1,200 mg by mouth 2 (two) times daily.    . raloxifene (EVISTA) 60 MG tablet Take 60 mg by mouth daily.    . traMADol (ULTRAM) 50 MG tablet Take 1-2 tablets (50-100 mg total) by mouth every 6 (six) hours as needed. (Patient taking differently: Take 50-100 mg by mouth every 6 (six) hours as needed for moderate pain. ) 80 tablet 0  . vitamin B-12 (CYANOCOBALAMIN) 1000 MCG tablet Take 1,000 mcg by mouth daily.    Marland Kitchen zolpidem (AMBIEN) 10 MG tablet Take 3.33 mg by mouth at bedtime as needed for sleep. 1/3 tablet    . docusate sodium (COLACE) 100 MG capsule Take 1 capsule (100 mg total) by mouth 2 (two) times daily. (Patient not taking: Reported on 03/26/2016) 10 capsule 0  . polyethylene glycol (MIRALAX / GLYCOLAX) packet Take 17 g by mouth 2 (two) times daily. (Patient not taking: Reported on 03/26/2016) 14 each 0   No facility-administered medications prior to visit.     PAST MEDICAL HISTORY: Past Medical History:  Diagnosis Date  . Allergic rhinitis   . Anxiety   . Arthritis   . Breast cancer ([place])    left breast cancer  . Complication of anesthesia    memory loss  .  Concussion 2015   fell and hit head  . Depression    situational depression  . Diverticulosis   . GERD (gastroesophageal reflux disease)   . Headache(784.0)    sinus HAs  . Hyperlipidemia   . Neuromuscular disorder ([facility])    neuropathy- nerve damage in neck causes nerve damage in toes    PAST SURGICAL HISTORY: Past Surgical History:  Procedure Laterality Date  . ABDOMINAL HYSTERECTOMY    . CHOLECYSTECTOMY N/A 03/04/2015   Procedure: LAPAROSCOPIC CHOLECYSTECTOMY WITH INTRAOPERATIVE CHOLANGIOGRAM;  Surgeon: Greer Pickerel, MD;  Location: [facility];  Service: General;  Laterality: N/A;  . ESOPHAGOGASTRODUODENOSCOPY N/A 08/15/2012   Procedure: ESOPHAGOGASTRODUODENOSCOPY (EGD);  Surgeon: Inda Castle, MD;  Location: [facility];  Service: Endoscopy;  Laterality: N/A;  . MASTECTOMY     left  . NASAL SINUS SURGERY     2004  . ORIF WRIST FRACTURE Right 08/21/2015   Procedure: OPEN REDUCTION INTERNAL FIXATION (ORIF) WRIST FRACTURE;  Surgeon: Dayna Barker, MD;  Location: [facility];  Service: Plastics;  Laterality: Right;  .  REPLACEMENT TOTAL KNEE     left  . REVERSE SHOULDER ARTHROPLASTY Left 11/11/2014   Procedure: LEFT REVERSE SHOULDER ARTHROPLASTY;  Surgeon: Justice Britain, MD;  Location: [facility];  Service: Orthopedics;  Laterality: Left;  . SHOULDER SURGERY Bilateral    repairs, rot cuff repair  . STAPEDECTOMY Left    left ear  . TOTAL KNEE ARTHROPLASTY     right  . TOTAL KNEE ARTHROPLASTY Right 08/09/2015   Procedure: TOTAL KNEE ARTHROPLASTY;  Surgeon: Paralee Cancel, MD;  Location: [facility];  Service: Orthopedics;  Laterality: Right;    FAMILY HISTORY: Family History  Problem Relation Age of Onset  . Colon cancer Father 63  . Breast cancer Mother   . Leukemia Son   . Pancreatic cancer Sister     SOCIAL HISTORY:  Social History   Social History  . Marital status: Married    Spouse name: Doren Custard  . Number of children: 3  . Years of education: MBA   Occupational History  . Retired      Social History Main Topics  . Smoking status: Former Smoker    Packs/day: 0.50    Years: 6.00    Types: Cigarettes    Quit date: 01/15/1958  . Smokeless tobacco: Never Used  . Alcohol use No  . Drug use: No  . Sexual activity: Not on file   Other Topics Concern  . Not on file   Social History Narrative   Patient lives at home with spouse.   Caffeine use: none   3 children; 1 deceased     PHYSICAL EXAM  Vitals:   03/26/16 0953  BP: 102/68  Pulse: 87  Weight: 150 lb (68 kg)   Wt Readings from Last 3 Encounters:  03/26/16 150 lb (68 kg)  10/31/15 150 lb 12.8 oz (68.4 kg)  08/20/15 154 lb (69.9 kg)   Body mass index is 26.57 kg/m.  MMSE - Mini Mental State Exam 03/26/2016 10/31/2015  Orientation to time 2 4  Orientation to Place 5 5  Registration 3 3  Attention/ Calculation 3 3  Recall 2 1  Language- name 2 objects 2 2  Language- repeat 0 0  Language- follow 3 step command 3 3  Language- read & follow direction 1 1  Write a sentence 1 1  Copy design 1 1  Total score 23 24   GENERAL EXAM: Patient is in no distress; well developed, nourished and groomed; neck is supple  CARDIOVASCULAR: Regular rate and rhythm, SYSTOLIC MURMUR OVER RIGHT PRECORDIAL REGION; no carotid bruits  NEUROLOGIC: MENTAL STATUS: awake, alert, language fluent, comprehension intact, naming intact, fund of knowledge appropriate CRANIAL NERVE: pupils equal and reactive to light, visual fields full to confrontation, extraocular muscles intact, no nystagmus, facial sensation and strength symmetric, hearing intact, palate elevates symmetrically, uvula midline, shoulder shrug symmetric, tongue midline. MOTOR: normal bulk and tone, full strength in the BUE, BLE SENSORY: DECR LT, TEMP IN FEET; ABSENT VIB AT ANKLES AND TOES COORDINATION: finger-nose-finger, fine finger movements normal REFLEXES: deep tendon reflexes TRACE and symmetric GAIT/STATION: narrow based gait; SHORT STEPS FEET. SLOW  UNSTEADY TURNING. CANNOT TOE, HEEL OR TANDEM; UNSTEADY ON ROMBERG    DIAGNOSTIC DATA (LABS, IMAGING, TESTING) - I reviewed patient records, labs, notes, testing and imaging myself where available.  Lab Results  Component Value Date   WBC 11.9 (H) 08/20/2015   HGB 12.4 08/20/2015   HCT 38.7 08/20/2015   MCV 92.4 08/20/2015   PLT 326 08/20/2015  Component Value Date/Time   NA 136 08/20/2015 2205   K 4.3 08/20/2015 2205   CL 103 08/20/2015 2205   CO2 26 08/20/2015 2205   GLUCOSE 115 (H) 08/20/2015 2205   BUN 14 08/20/2015 2205   CREATININE 0.83 08/20/2015 2205   CALCIUM 8.9 08/20/2015 2205   PROT 6.4 (L) 02/25/2015 1047   PROT 6.6 01/20/2014 1237   ALBUMIN 3.5 02/25/2015 1047   AST 23 02/25/2015 1047   ALT 18 02/25/2015 1047   ALKPHOS 60 02/25/2015 1047   BILITOT 0.8 02/25/2015 1047   GFRNONAA >60 08/20/2015 2205   GFRAA >60 08/20/2015 2205   No results found for: CHOL, HDL, LDLCALC, LDLDIRECT, TRIG, CHOLHDL Lab Results  Component Value Date   HGBA1C 5.7 (H) 01/20/2014   Lab Results  Component Value Date   VITAMINB12 >1999 (H) 01/20/2014   Lab Results  Component Value Date   TSH 1.200 01/20/2014    01/02/14 CT head / maxillofacial 1. No acute intracranial pathology. 2. No acute osseous injury of the maxillofacial bones. 3. Mild generalized cerebral atrophy and chronic microvascular disease.  09/10/13 CTA head/neck 1. Negative for age CTA head and neck. 2. Stable CT appearance of the brain.  01/20/14 Neuropathy panel - negative  03/15/14 MRI lumbar spine demonstrating multilevel degenerative changes as detailed above. Specifically: 1. Moderate left foraminal narrowing at L1-L2 due to disc protrusion, endplate spurring and facet hypertrophy could lead to dynamic impingement of the left L1 nerve root  2. Moderately severe right lateral recess stenosis at L2-L3 due to disc protrusion, endplate spurring, mild facet hypertrophy leading to mild spinal stenosis.  There could be right L3 nerve root compression at this level. 3. Moderately to severe fairly narrowed neural foramina and mild to moderate lateral recess stenosis at L3-L4 due to disc protrusion, endplate spurring and facet hypertrophy causing mild spinal stenosis. There could be impingement of either of the exiting L3 nerve root with less potential for dynamic impingement of the L4 nerve roots. 4. Moderate bilateral foraminal narrowing at L4-L5 due to disc bulge and facet hypertrophy that could lead to dynamic impingement of the exiting L4 nerve roots   5. Severe right lateral recess stenosis and bilateral moderate foraminal narrowing at L5-S1 due to anterolisthesis, severe facet hypertrophy and right paramedian disc herniation. There appears to be compression of the right S1 nerve root and there could also be dynamic impingement of the exiting L5 nerve roots.  03/19/14 EMG/NCS  1. Severe length dependent axonal sensorimotor polyneuropathy. 2. Bilateral lower lumbar radiculopathies.  04/11/15 CT head  1.    Mild cortical atrophy, unchanged when compared to the 01/02/2014 CT scan. The extent of atrophy is typical for age. 2.    Mild to moderate chronic microvascular ischemic changes, essentially unchanged when compared to the prior scan. 3.    There are no acute findings.      ASSESSMENT AND PLAN  83 y.o. year old female with postconcussion syndrome (fall on 11/19/13) with resultant scalp numbness/sensitivity, facial pain. Also with suspected underlying peripheral neuropathy for past 5-6 years, without clear etiology (possible hereditary neuropathy). Then second fall on Feb 19, 2014, with right facial trauma (no fractures) and right forearm fracture.  EMG and MRI lumbar spine confirm combination of severe axonal neuropathy + lumbar polyradiculopathy. I do not think patient would be a good surgical candidate at this time due to combination of neuropathy + lumbar spine disease, lack of back pain,  advanced age, and relatively good improvement with  conservative therapy. PT evaluation and exercises have significantly improved her balance and walking.  Also with short term memory loss, likely related to mild dementia.   Also with intermittent headaches, likely post-traumatic headaches.   Dx: idiopathic hereditary neuropathy + lumbar polyradiculopathy + post-traumatic headaches + dementia + anxiety/insomnia  Mild dementia  Generalized anxiety disorder  Memory loss  Post concussion syndrome  Hereditary and idiopathic peripheral neuropathy  Lumbar radiculopathy    PLAN:  - considered MRI brain --> cannot due to stapedectomy and metal in ears  - gradually increase physical activity / movement; will refer to physical therapy for balance training   - use rollator walker for safety  - repeat neuropsychology evaluation in Dec 2018  Orders Placed This Encounter  Procedures  . For home use only DME 4 wheeled rolling walker with seat  . Ambulatory referral to Physical Therapy   Return in about 1 year (around 03/26/2017).    Penni Bombard, MD 8/87/5797, 28:20 AM Certified in Neurology, Neurophysiology and Neuroimaging  [facility] [address] [place], [place] [address] [phone]

## 2016-03-26 NOTE — Patient Instructions (Signed)
-   I will setup physical therapy and rollator walker

## 2016-04-02 ENCOUNTER — Ambulatory Visit: Payer: Medicare HMO | Admitting: Rehabilitation

## 2016-04-05 ENCOUNTER — Ambulatory Visit: Payer: Medicare HMO | Attending: Diagnostic Neuroimaging | Admitting: Physical Therapy

## 2016-04-05 ENCOUNTER — Encounter: Payer: Self-pay | Admitting: Physical Therapy

## 2016-04-05 DIAGNOSIS — R2681 Unsteadiness on feet: Secondary | ICD-10-CM | POA: Diagnosis not present

## 2016-04-05 DIAGNOSIS — M6281 Muscle weakness (generalized): Secondary | ICD-10-CM | POA: Insufficient documentation

## 2016-04-05 DIAGNOSIS — R296 Repeated falls: Secondary | ICD-10-CM | POA: Diagnosis not present

## 2016-04-05 DIAGNOSIS — R208 Other disturbances of skin sensation: Secondary | ICD-10-CM | POA: Insufficient documentation

## 2016-04-05 NOTE — Therapy (Signed)
[facility] [address] [place], [place], [address] Phone: [phone]   Fax:  [phone]  Physical Therapy Evaluation  Patient Details  Name: [name] MRN: [id number] Date of Birth: 03/26/1929 Referring Provider: Penni Bombard, MD  Encounter Date: 04/05/2016      PT End of Session - 04/05/16 2048    Visit Number 1   Number of Visits 13   Date for PT Re-Evaluation 06/04/16   Authorization Type [employer] H[employer]-G code and progress note every 10th visit   PT Start Time 1446   PT Stop Time 1535   PT Time Calculation (min) 49 min   Activity Tolerance Patient tolerated treatment well   Behavior During Therapy [facility] for tasks assessed/performed      Past Medical History:  Diagnosis Date  . Allergic rhinitis   . Anxiety   . Arthritis   . Breast cancer ([place])    left breast cancer  . Complication of anesthesia    memory loss  . Concussion 2015   fell and hit head  . Depression    situational depression  . Diverticulosis   . GERD (gastroesophageal reflux disease)   . Headache(784.0)    sinus HAs  . Hyperlipidemia   . Neuromuscular disorder (HCC)    neuropathy- nerve damage in neck causes nerve damage in toes    Past Surgical History:  Procedure Laterality Date  . ABDOMINAL HYSTERECTOMY    . CHOLECYSTECTOMY N/A 03/04/2015   Procedure: LAPAROSCOPIC CHOLECYSTECTOMY WITH INTRAOPERATIVE CHOLANGIOGRAM;  Surgeon: Greer Pickerel, MD;  Location: [facility];  Service: General;  Laterality: N/A;  . ESOPHAGOGASTRODUODENOSCOPY N/A 08/15/2012   Procedure: ESOPHAGOGASTRODUODENOSCOPY (EGD);  Surgeon: Inda Castle, MD;  Location: [facility];  Service: Endoscopy;  Laterality: N/A;  . MASTECTOMY     left  . NASAL SINUS SURGERY     2004  . ORIF WRIST FRACTURE Right 08/21/2015   Procedure: OPEN REDUCTION INTERNAL FIXATION (ORIF) WRIST FRACTURE;  Surgeon: Dayna Barker, MD;  Location: [facility];  Service: Plastics;  Laterality:  Right;  . REPLACEMENT TOTAL KNEE     left  . REVERSE SHOULDER ARTHROPLASTY Left 11/11/2014   Procedure: LEFT REVERSE SHOULDER ARTHROPLASTY;  Surgeon: Justice Britain, MD;  Location: [facility];  Service: Orthopedics;  Laterality: Left;  . SHOULDER SURGERY Bilateral    repairs, rot cuff repair  . STAPEDECTOMY Left    left ear  . TOTAL KNEE ARTHROPLASTY     right  . TOTAL KNEE ARTHROPLASTY Right 08/09/2015   Procedure: TOTAL KNEE ARTHROPLASTY;  Surgeon: Paralee Cancel, MD;  Location: [facility];  Service: Orthopedics;  Laterality: Right;    There were no vitals filed for this visit.       Subjective Assessment - 04/05/16 1455    Subjective Pt presents to OPPT PT for gait and balance training-referred by neurologist. Pt reports having neuropathy in feet and has had two significant falls with concussion.  Also reports having intermittent episodes of dizziness after bending down > standing up.   Patient is accompained by: Family member   Pertinent History idiopathic polyneuropathy, repeated falls, dementia memory loss, recent R TKA, post concussive syndrome, breast CA   Limitations Walking   Patient Stated Goals To stop falling and improve balance   Currently in Pain? No/denies            [facility] PT Assessment - 04/05/16 1503      Assessment   Medical Diagnosis Repeated falls, post-concussive syndrome  Referring Provider Penni Bombard, MD   Onset Date/Surgical Date 01/21/16   Next MD Visit PRN   Prior Therapy yes for balance     Precautions   Precautions Fall     Balance Screen   Has the patient fallen in the past 6 months Yes   How many times? 1-2   Has the patient had a decrease in activity level because of a fear of falling?  No   Is the patient reluctant to leave their home because of a fear of falling?  No     Home Environment   Living Environment Private residence   Living Arrangements Spouse/significant other   Available Help at Discharge Family;Available 24 hours/day    Type of Home House   Home Access Stairs to enter   Entrance Stairs-Number of Steps 1   Entrance Stairs-Rails Right   Home Layout Multi-level;Able to live on main level with bedroom/bathroom     Prior Function   Level of Independence Independent     Cognition   Overall Cognitive Status History of cognitive impairments - at baseline   Memory Impaired   Memory Impairment Decreased recall of new information;Decreased short term memory     Observation/Other Assessments   Focus on Therapeutic Outcomes (FOTO)  63 (37% limited; predicted 30% limited)   Other Surveys  Other Surveys   Activities of Balance Confidence Scale (ABC Scale)  91.9%     Sensation   Light Touch Impaired by gross assessment   Hot/Cold Appears Intact   Proprioception Appears Intact     ROM / Strength   AROM / PROM / Strength Strength     Strength   Overall Strength Deficits   Overall Strength Comments L hip flexion 4-/5, knee extension 5/5, knee flexion 4/5, ankle DF 4/5; RLE: 5/5 hip flexion and knee extension, 4/5 ankle DF and knee flexion     Ambulation/Gait   Ambulation/Gait Yes   Ambulation/Gait Assistance 6: Modified independent (Device/Increase time)   Ambulation Distance (Feet) 200 Feet   Assistive device None   Gait Pattern Step-through pattern;Decreased stride length;Decreased dorsiflexion - right;Decreased dorsiflexion - left;Decreased weight shift to right;Right foot flat;Left foot flat;Decreased trunk rotation   Ambulation Surface Level;Indoor   Gait velocity 12.15 seconds or 2.69 ft/sec     Standardized Balance Assessment   Standardized Balance Assessment Five Times Sit to Stand;10 meter walk test   Five times sit to stand comments  11 seconds   10 Meter Walk 12.15 seconds or 2.69 ft/sec     High Level Balance   High Level Balance Comments RLE SLS: 4 seconds, LLE SLS: 2 seconds     Functional Gait  Assessment   Gait assessed  --           PT Education - 04/05/16 2048    Education  provided Yes   Education Details Clinical findings, PT POC and goals   Person(s) Educated Patient;Spouse   Methods Explanation   Comprehension Verbalized understanding          PT Short Term Goals - 04/05/16 2103      PT SHORT TERM GOAL #1   Title (TARGET DATE FOR STG 04/27/16) Pt will perform strengthening and balance HEP under the supervision of husband   Status New     PT SHORT TERM GOAL #2   Title Pt will participate in FGA assessment with LTG to be written   Status New     PT SHORT TERM GOAL #3  Title Pt will improve LE strength and balance with improvement in her ability to perform SLS on each leg >6 seconds   Baseline 1-2 seconds   Status New     PT SHORT TERM GOAL #4   Title Pt will ambulate up/down 8 stairs, no rails and over compliant surfaces x 200' with supervision and no episodes of LE foot drag/LOB   Status New           PT Long Term Goals - 2016-04-18 04-27-2107      PT LONG TERM GOAL #1   Title (TARGET DATE FOR ALL LTG 05/20/16) Pt will improve safety with gait as indicated by an increase in gait velocity to >3.5 ft/sec   Baseline 2.69   Status New     PT LONG TERM GOAL #2   Title Pt will demonstrate decreased falls risk as indicated by FGA score of >25/30   Baseline TBD     PT LONG TERM GOAL #3   Title Pt will demonstrate safe floor > furniture transfer at mod I level   Status New     PT LONG TERM GOAL #4   Title Pt will negotiate 8 stairs, no rails and will ambulate >1,000' over indoor and uneven outdoor surfaces with supervision and no tripping or LOB   Status New     PT LONG TERM GOAL #5   Title Pt will improve LE strength and balance to be able to perform SLS x 10 seconds on each LE   Status New               Plan - 18-Apr-2016 27-Apr-2050    Clinical Impression Statement Pt is a 83 year old female presenting to OPPT neuro for medium complexity PT evaluation for repeated falls and post-concussive syndrome. Pt's PMH significant for the following:  dementia and memory loss, dizziness and anxiety, multiple falls with injury, breast CA, lumbar radiculopathy and polyneuropathy. The following deficits were noted during pt's exam: impaired sensation, impaired strength, balance, and gait. Pt required cues intermittently for attention to task as well as awareness to safety.  Pt's gait speed indicates pt is at a limited community ambulation level but would benefit from interventions to prevent further falls. Pt would benefit from skilled PT to address these impairments and functional limitations to maximize functional mobility independence and reduce falls risk   Rehab Potential Good   Clinical Impairments Affecting Rehab Potential idiopathic polyneuropathy   PT Frequency 2x / week   PT Duration 6 weeks   PT Treatment/Interventions ADLs/Self Care Home Management;DME Instruction;Gait training;Stair training;Functional mobility training;Therapeutic activities;Therapeutic exercise;Balance training;Neuromuscular re-education;Patient/family education;Orthotic Fit/Training;Vestibular   PT Next Visit Plan Perform FGA and set goal; MD recommended rollator-demonstrate and discuss with pt, provide HEP   Consulted and Agree with Plan of Care Patient;Family member/caregiver   Family Member Consulted husband      Patient will benefit from skilled therapeutic intervention in order to improve the following deficits and impairments:  Decreased balance, Decreased cognition, Decreased strength, Difficulty walking, Dizziness, Impaired sensation  Visit Diagnosis: Repeated falls  Unsteadiness on feet  Other disturbances of skin sensation  Muscle weakness (generalized)      G-Codes - 04/18/16 April 26, 2113    Functional Assessment Tool Used (Outpatient Only) gait velocity, clincial judgment    Functional Limitation Mobility: Walking and moving around   Mobility: Walking and Moving Around Current Status ([id number]) At least 20 percent but less than 40 percent impaired,  limited or restricted   Mobility: Walking  and Moving Around Goal Status [phone]) At least 1 percent but less than 20 percent impaired, limited or restricted       Problem List Patient Active Problem List   Diagnosis Date Noted  . Preoperative testing   . Radius fracture 08/20/2015  . Leukocytosis 08/20/2015  . Insomnia 08/20/2015  . Overweight (BMI 25.0-29.9) 08/10/2015  . S/P right TKA 08/09/2015  . S/P knee replacement 08/09/2015  . S/p reverse total shoulder arthroplasty 11/11/2014  . Vertigo 09/10/2013  . Nausea alone 08/15/2012  . Duodenal ulcer 08/15/2012  . Personal history of colonic polyps 12/11/2011  . ASTHMATIC BRONCHITIS, ACUTE 03/31/2007  . ALLERGIC RHINITIS 03/31/2007  . Esophageal reflux 03/31/2007   Raylene Everts, PT, DPT 04/05/16    9:19 PM   [facility] [address] [place], [place], [address] Phone: [phone]   Fax:  [phone]  Name: [name] MRN: [id number] Date of Birth: [birth year]

## 2016-04-17 ENCOUNTER — Ambulatory Visit: Payer: Medicare HMO | Admitting: Physical Therapy

## 2016-04-18 ENCOUNTER — Telehealth: Payer: Self-pay | Admitting: Diagnostic Neuroimaging

## 2016-04-18 MED ORDER — GABAPENTIN 300 MG PO CAPS: 300.0000 mg | ORAL_CAPSULE | Freq: Two times a day (BID) | ORAL | 3 refills | Status: AC

## 2016-04-18 NOTE — Telephone Encounter (Signed)
Pt spouse calling for a refill of gabapentin (NEURONTIN) 300 MG capsule  [place], [place] [phone] (Phone) [phone] (Fax)   No more refills are avail for pt

## 2016-04-18 NOTE — Addendum Note (Signed)
Addended byOliver Hum on: 04/18/2016 04:47 PM   Modules accepted: Orders

## 2016-04-18 NOTE — Telephone Encounter (Signed)
Cancelled the [facility] pharm prescription that they had on hold. Husband wanted to come from [facility].  Done.

## 2016-04-24 ENCOUNTER — Ambulatory Visit: Payer: Medicare HMO | Attending: Diagnostic Neuroimaging | Admitting: Physical Therapy

## 2016-04-24 ENCOUNTER — Encounter: Payer: Self-pay | Admitting: Physical Therapy

## 2016-04-24 DIAGNOSIS — M6281 Muscle weakness (generalized): Secondary | ICD-10-CM | POA: Diagnosis not present

## 2016-04-24 DIAGNOSIS — R208 Other disturbances of skin sensation: Secondary | ICD-10-CM | POA: Diagnosis not present

## 2016-04-24 DIAGNOSIS — R2681 Unsteadiness on feet: Secondary | ICD-10-CM | POA: Insufficient documentation

## 2016-04-24 DIAGNOSIS — R296 Repeated falls: Secondary | ICD-10-CM | POA: Insufficient documentation

## 2016-04-24 NOTE — Therapy (Signed)
[facility] [address] [place], [place], [address] Phone: [phone]   Fax:  [phone]  Physical Therapy Treatment  Patient Details  Name: [name] MRN: [id number] Date of Birth: 27-Jul-1929 Referring Provider: Penni Bombard, MD  Encounter Date: 04/24/2016      PT End of Session - 04/24/16 1415    Visit Number 2   Number of Visits 13   Date for PT Re-Evaluation 06/04/16   Authorization Type [employer]-G code and progress note every 10th visit   PT Start Time 1316   PT Stop Time 1414   PT Time Calculation (min) 58 min   Activity Tolerance Other (comment)  pt very anxious during balance testing   Behavior During Therapy Anxious      Past Medical History:  Diagnosis Date  . Allergic rhinitis   . Anxiety   . Arthritis   . Breast cancer ([place])    left breast cancer  . Complication of anesthesia    memory loss  . Concussion 2015   fell and hit head  . Depression    situational depression  . Diverticulosis   . GERD (gastroesophageal reflux disease)   . Headache(784.0)    sinus HAs  . Hyperlipidemia   . Neuromuscular disorder (HCC)    neuropathy- nerve damage in neck causes nerve damage in toes    Past Surgical History:  Procedure Laterality Date  . ABDOMINAL HYSTERECTOMY    . CHOLECYSTECTOMY N/A 03/04/2015   Procedure: LAPAROSCOPIC CHOLECYSTECTOMY WITH INTRAOPERATIVE CHOLANGIOGRAM;  Surgeon: Greer Pickerel, MD;  Location: [facility];  Service: General;  Laterality: N/A;  . ESOPHAGOGASTRODUODENOSCOPY N/A 08/15/2012   Procedure: ESOPHAGOGASTRODUODENOSCOPY (EGD);  Surgeon: Inda Castle, MD;  Location[facility];  Service: Endoscopy;  Laterality: N/A;  . MASTECTOMY     left  . NASAL SINUS SURGERY     2004  . ORIF WRIST FRACTURE Right 08/21/2015   Procedure: OPEN REDUCTION INTERNAL FIXATION (ORIF) WRIST FRACTURE;  Surgeon: Dayna Barker, MD;  Location: [facility];  Service: Plastics;  Laterality:  Right;  . REPLACEMENT TOTAL KNEE     left  . REVERSE SHOULDER ARTHROPLASTY Left 11/11/2014   Procedure: LEFT REVERSE SHOULDER ARTHROPLASTY;  Surgeon: Justice Britain, MD;  Location: [facility];  Service: Orthopedics;  Laterality: Left;  . SHOULDER SURGERY Bilateral    repairs, rot cuff repair  . STAPEDECTOMY Left    left ear  . TOTAL KNEE ARTHROPLASTY     right  . TOTAL KNEE ARTHROPLASTY Right 08/09/2015   Procedure: TOTAL KNEE ARTHROPLASTY;  Surgeon: Paralee Cancel, MD;  [facility];  Service: Orthopedics;  Laterality: Right;    There were no vitals filed for this visit.      Subjective Assessment - 04/24/16 1326    Subjective No issues and no falls since evaluation.  Pt and husband with lots of questions about purpose and methods of balance retraining   Patient is accompained by: Family member   Pertinent History idiopathic polyneuropathy, repeated falls, dementia memory loss, recent R TKA, post concussive syndrome, breast CA   Limitations Walking   Patient Stated Goals To stop falling and improve balance   Currently in Pain? No/denies            [facility] PT Assessment - 04/24/16 1327      Functional Gait  Assessment   Gait assessed  Yes   Gait Level Surface Walks 20 ft, slow speed, abnormal gait pattern, evidence for imbalance or deviates  10-15 in outside of the 12 in walkway width. Requires more than 7 sec to ambulate 20 ft.   Change in Gait Speed Able to change speed, demonstrates mild gait deviations, deviates 6-10 in outside of the 12 in walkway width, or no gait deviations, unable to achieve a major change in velocity, or uses a change in velocity, or uses an assistive device.   Gait with Horizontal Head Turns Performs head turns smoothly with slight change in gait velocity (eg, minor disruption to smooth gait path), deviates 6-10 in outside 12 in walkway width, or uses an assistive device.   Gait with Vertical Head Turns Performs task with slight change in gait velocity (eg,  minor disruption to smooth gait path), deviates 6 - 10 in outside 12 in walkway width or uses assistive device   Gait and Pivot Turn Pivot turns safely in greater than 3 sec and stops with no loss of balance, or pivot turns safely within 3 sec and stops with mild imbalance, requires small steps to catch balance.   Step Over Obstacle Is able to step over one shoe box (4.5 in total height) but must slow down and adjust steps to clear box safely. May require verbal cueing.   Gait with Narrow Base of Support Ambulates less than 4 steps heel to toe or cannot perform without assistance.   Gait with Eyes Closed Walks 20 ft, slow speed, abnormal gait pattern, evidence for imbalance, deviates 10-15 in outside 12 in walkway width. Requires more than 9 sec to ambulate 20 ft.   Ambulating Backwards Walks 20 ft, uses assistive device, slower speed, mild gait deviations, deviates 6-10 in outside 12 in walkway width.   Steps Alternating feet, must use rail.   Total Score 15   FGA comment: 15/30 increased falls risk            Balance Exercises - 04/24/16 1400      Balance Exercises: Standing   Tandem Stance Eyes open;Eyes closed;Upper extremity support 1;3 reps;10 secs   SLS Eyes open;Eyes closed;Solid surface;Upper extremity support 1;3 reps;10 secs   Marching Limitations 15 reps, alternating, bilat UE support   Heel Raises Limitations 12 reps eyes open, 5 reps eyes closed   Toe Raise Limitations 12 reps eyes open, 5 reps eyes closed     OTAGO PROGRAM   Hip ABductor Other reps (comment)  bilat UE support, 15 reps alternating LE           PT Education - 04/24/16 1414    Education provided Yes   Education Details HEP; discussed prescription of rollator but husband feels it would be too intrusive in patient's routine and daily life   Person(s) Educated Patient;Spouse   Methods Explanation;Demonstration;Handout   Comprehension Verbalized understanding;Returned demonstration          PT  Short Term Goals - 04/24/16 1418      PT SHORT TERM GOAL #1   Title (TARGET DATE FOR STG 04/27/16) Pt will perform strengthening and balance HEP under the supervision of husband   Status New     PT SHORT TERM GOAL #2   Title Pt will participate in FGA assessment with LTG to be written   Baseline met on 04/24/16   Status Achieved     PT SHORT TERM GOAL #3   Title Pt will improve LE strength and balance with improvement in her ability to perform SLS on each leg >6 seconds   Baseline 1-2 seconds   Status New  PT SHORT TERM GOAL #4   Title Pt will ambulate up/down 8 stairs, no rails and over compliant surfaces x 200' with supervision and no episodes of LE foot drag/LOB   Status New           PT Long Term Goals - 04/24/16 1419      PT LONG TERM GOAL #1   Title (TARGET DATE FOR ALL LTG 05/20/16) Pt will improve safety with gait as indicated by an increase in gait velocity to >3.5 ft/sec   Baseline 2.69   Status New     PT LONG TERM GOAL #2   Title Pt will demonstrate decreased falls risk as indicated by FGA score of >25/30   Baseline 15/30   Status New     PT LONG TERM GOAL #3   Title Pt will demonstrate safe floor > furniture transfer at mod I level-D/C goal 04/24/16   Baseline unsafe to practice/perform due to recent knee replacement and hip pain   Status Deferred     PT LONG TERM GOAL #4   Title Pt will negotiate 8 stairs, no rails and will ambulate >1,000' over indoor and uneven outdoor surfaces with supervision and no tripping or LOB   Status New     PT LONG TERM GOAL #5   Title Pt will improve LE strength and balance to be able to perform SLS x 10 seconds on each LE   Status New               Plan - 04/24/16 1415    Clinical Impression Statement Session focused on assessment of pt falls risk and balance during more dynamic gait tasks; pt became very anxious during retro gait, heel-toe gait and walking with eyes closed and became perseverative on knees hitting  one another while performing heel-toe.  Difficult to re-direct and keep on task.  Pt FGA score does indicate increased risk for falls.  Reviewed standing balance and LE strengthening HEP with pt and husband.  Pt more relaxed by end of session and better able to focus on exercises.   Rehab Potential Good   Clinical Impairments Affecting Rehab Potential idiopathic polyneuropathy   PT Treatment/Interventions ADLs/Self Care Home Management;DME Instruction;Gait training;Stair training;Functional mobility training;Therapeutic activities;Therapeutic exercise;Balance training;Neuromuscular re-education;Patient/family education;Orthotic Fit/Training;Vestibular   PT Next Visit Plan review HEP-continue balance training with vision removed, compliant surfaces, endurance on Nustep   Consulted and Agree with Plan of Care Patient;Family member/caregiver   Family Member Consulted husband      Patient will benefit from skilled therapeutic intervention in order to improve the following deficits and impairments:  Decreased balance, Decreased cognition, Decreased strength, Difficulty walking, Dizziness, Impaired sensation  Visit Diagnosis: Repeated falls  Unsteadiness on feet  Other disturbances of skin sensation  Muscle weakness (generalized)     Problem List Patient Active Problem List   Diagnosis Date Noted  . Preoperative testing   . Radius fracture 08/20/2015  . Leukocytosis 08/20/2015  . Insomnia 08/20/2015  . Overweight (BMI 25.0-29.9) 08/10/2015  . S/P right TKA 08/09/2015  . S/P knee replacement 08/09/2015  . S/p reverse total shoulder arthroplasty 11/11/2014  . Vertigo 09/10/2013  . Nausea alone 08/15/2012  . Duodenal ulcer 08/15/2012  . Personal history of colonic polyps 12/11/2011  . ASTHMATIC BRONCHITIS, ACUTE 03/31/2007  . ALLERGIC RHINITIS 03/31/2007  . Esophageal reflux 03/31/2007   Raylene Everts, PT, DPT 04/24/16    2:21 PM    [facility] [address]  [place], [place], [address] Phone: [phone]   Fax:  [phone]  Name: [name] MRN: [id number] Date of Birth: Apr 30, 1929

## 2016-04-24 NOTE — Patient Instructions (Signed)
ANKLE: Dorsiflexion, Bilateral - Sitting    Sit at edge of sitting surface, feet on floor. Raise front of feet, keeping heels down. _12__ reps per set, __2_ sets per day, _5__ days per week   Copyright  VHI. All rights reserved.  ANKLE: Plantarflexion, Bilateral - Standing    Stand with upright posture. Raise heels up as high as possible. _12__ reps per set, _2__ sets per day, __5_ days per week Hold onto a support.  Copyright  VHI. All rights reserved.   Standing Marching   Using a chair if necessary, march in place. Repeat 15-20 times. Do 2 sessions per day.   Hip Side Kick   Holding a chair for balance, keep legs shoulder width apart and toes pointed forward. Swing a leg out to side, keeping knee straight. Do not lean. Repeat using other leg. Repeat 15-20 times. Do 2 sessions per day.   Feet Heel-Toe "Tandem", Varied Arm Positions - Eyes Open   With eyes open, right foot directly in front of the other, touching countertop, look straight ahead at a stationary object. Hold 10 seconds.  Repeat with left foot forwards. Repeat 2  times per session. Do 2 sessions per day.  Copyright  VHI. All rights reserved.    Standing On One Leg Without Support .  Stand on one leg in neutral spine without support. Hold 10 seconds.  Repeat on other leg. Do 2 repetitions, 2 sets.

## 2016-04-26 ENCOUNTER — Ambulatory Visit: Payer: Medicare HMO | Admitting: Physical Therapy

## 2016-04-26 ENCOUNTER — Encounter: Payer: Self-pay | Admitting: Physical Therapy

## 2016-04-26 DIAGNOSIS — R296 Repeated falls: Secondary | ICD-10-CM

## 2016-04-26 DIAGNOSIS — M6281 Muscle weakness (generalized): Secondary | ICD-10-CM

## 2016-04-26 DIAGNOSIS — R2681 Unsteadiness on feet: Secondary | ICD-10-CM

## 2016-04-26 DIAGNOSIS — R208 Other disturbances of skin sensation: Secondary | ICD-10-CM

## 2016-04-26 NOTE — Therapy (Signed)
[facility] [address] [place], [place], [address] Phone: [phone]   Fax:  [phone]  Physical Therapy Treatment  Patient Details  Name: [name] MRN: [id number] Date of Birth: 1929-05-18 Referring Provider: Penni Bombard, MD  Encounter Date: 04/26/2016      PT End of Session - 04/26/16 1654    Visit Number 3   Number of Visits 13   Date for PT Re-Evaluation 06/04/16   Authorization Type [employer]-G code and progress note every 10th visit   PT Start Time 1445   PT Stop Time 1530   PT Time Calculation (min) 45 min   Activity Tolerance Patient tolerated treatment well   Behavior During Therapy [facility] for tasks assessed/performed      Past Medical History:  Diagnosis Date  . Allergic rhinitis   . Anxiety   . Arthritis   . Breast cancer ([place])    left breast cancer  . Complication of anesthesia    memory loss  . Concussion 2015   fell and hit head  . Depression    situational depression  . Diverticulosis   . GERD (gastroesophageal reflux disease)   . Headache(784.0)    sinus HAs  . Hyperlipidemia   . Neuromuscular disorder (HCC)    neuropathy- nerve damage in neck causes nerve damage in toes    Past Surgical History:  Procedure Laterality Date  . ABDOMINAL HYSTERECTOMY    . CHOLECYSTECTOMY N/A 03/04/2015   Procedure: LAPAROSCOPIC CHOLECYSTECTOMY WITH INTRAOPERATIVE CHOLANGIOGRAM;  Surgeon: Greer Pickerel, MD;  Location: [facility];  Service: General;  Laterality: N/A;  . ESOPHAGOGASTRODUODENOSCOPY N/A 08/15/2012   Procedure: ESOPHAGOGASTRODUODENOSCOPY (EGD);  Surgeon: Inda Castle, MD;  Location: [facility];  Service: Endoscopy;  Laterality: N/A;  . MASTECTOMY     left  . NASAL SINUS SURGERY     2004  . ORIF WRIST FRACTURE Right 08/21/2015   Procedure: OPEN REDUCTION INTERNAL FIXATION (ORIF) WRIST FRACTURE;  Surgeon: Dayna Barker, MD;  Location: [facility];  Service: Plastics;  Laterality:  Right;  . REPLACEMENT TOTAL KNEE     left  . REVERSE SHOULDER ARTHROPLASTY Left 11/11/2014   Procedure: LEFT REVERSE SHOULDER ARTHROPLASTY;  Surgeon: Justice Britain, MD;  Location: [facility];  Service: Orthopedics;  Laterality: Left;  . SHOULDER SURGERY Bilateral    repairs, rot cuff repair  . STAPEDECTOMY Left    left ear  . TOTAL KNEE ARTHROPLASTY     right  . TOTAL KNEE ARTHROPLASTY Right 08/09/2015   Procedure: TOTAL KNEE ARTHROPLASTY;  Surgeon: Paralee Cancel, MD;  Location: [facility];  Service: Orthopedics;  Laterality: Right;    There were no vitals filed for this visit.      Subjective Assessment - 04/26/16 1453    Subjective Reports having a HA and knee pain today but not out of the ordinary pain; going to a social today.   Patient is accompained by: Family member   Pertinent History idiopathic polyneuropathy, repeated falls, dementia memory loss, recent R TKA, post concussive syndrome, breast CA   Limitations Walking   Patient Stated Goals To stop falling and improve balance   Currently in Pain? No/denies            [facility] Adult PT Treatment/Exercise - 04/26/16 1455      Exercises   Exercises Knee/Hip     Knee/Hip Exercises: Stretches   Hip Flexor Stretch Right;60 seconds   Hip Flexor Stretch Limitations off side of mat  ITB Stretch Right;60 seconds;1 rep   ITB Stretch Limitations with foam roller for myofascial release   Other Knee/Hip Stretches Adductor stretch bilaterally in supine x 60 seconds     Knee/Hip Exercises: Aerobic   Nustep L5 x 8 minutes with bilat UE and LE + 2 minutes with LE only due to UE fatigue/pain for bilat UE and LE strengthening and endurance training     Knee/Hip Exercises: Standing   Hip Abduction Stengthening;Right;Left;2 sets;10 reps;Knee straight;Other (comment)  closed chain/stance LE with hip drops off balance beam   SLS foot on balance beam keeping hips level working on closed chain hip ABD             Balance Exercises -  04/26/16 1651      Balance Exercises: Standing   Tandem Gait Forward;Upper extremity support;Foam/compliant surface;4 reps           PT Education - 04/26/16 1654    Education provided Yes   Education Details hip strengthening, stretches   Person(s) Educated Patient;Spouse   Methods Explanation;Demonstration   Comprehension Need further instruction          PT Short Term Goals - 04/24/16 1418      PT SHORT TERM GOAL #1   Title (TARGET DATE FOR STG 04/27/16) Pt will perform strengthening and balance HEP under the supervision of husband   Status New     PT SHORT TERM GOAL #2   Title Pt will participate in FGA assessment with LTG to be written   Baseline met on 04/24/16   Status Achieved     PT SHORT TERM GOAL #3   Title Pt will improve LE strength and balance with improvement in her ability to perform SLS on each leg >6 seconds   Baseline 1-2 seconds   Status New     PT SHORT TERM GOAL #4   Title Pt will ambulate up/down 8 stairs, no rails and over compliant surfaces x 200' with supervision and no episodes of LE foot drag/LOB   Status New           PT Long Term Goals - 04/24/16 1419      PT LONG TERM GOAL #1   Title (TARGET DATE FOR ALL LTG 05/20/16) Pt will improve safety with gait as indicated by an increase in gait velocity to >3.5 ft/sec   Baseline 2.69   Status New     PT LONG TERM GOAL #2   Title Pt will demonstrate decreased falls risk as indicated by FGA score of >25/30   Baseline 15/30   Status New     PT LONG TERM GOAL #3   Title Pt will demonstrate safe floor > furniture transfer at mod I level-D/C goal 04/24/16   Baseline unsafe to practice/perform due to recent knee replacement and hip pain   Status Deferred     PT LONG TERM GOAL #4   Title Pt will negotiate 8 stairs, no rails and will ambulate >1,000' over indoor and uneven outdoor surfaces with supervision and no tripping or LOB   Status New     PT LONG TERM GOAL #5   Title Pt will improve LE  strength and balance to be able to perform SLS x 10 seconds on each LE   Status New               Plan - 04/26/16 1654    Clinical Impression Statement Continued to focus on LE strengthening, endurance, and balance on compliant surfaces.  Pt continued to report hip and knee pain-provided pt with initial hip stretches on mat-will continue to assess most beneficial stretches for HEP.  Pt with improved tolerance of activity today; pt less anxious and perseverative but continues to be hyperfocused on pain and prior injuries/surgeries   Clinical Impairments Affecting Rehab Potential idiopathic polyneuropathy   PT Treatment/Interventions ADLs/Self Care Home Management;DME Instruction;Gait training;Stair training;Functional mobility training;Therapeutic activities;Therapeutic exercise;Balance training;Neuromuscular re-education;Patient/family education;Orthotic Fit/Training;Vestibular   PT Next Visit Plan review HEP and add stretching for hip flexors, ADD, IT band-continue balance training with vision removed, compliant surfaces, endurance on Nustep   Consulted and Agree with Plan of Care Patient;Family member/caregiver   Family Member Consulted husband      Patient will benefit from skilled therapeutic intervention in order to improve the following deficits and impairments:  Decreased balance, Decreased cognition, Decreased strength, Difficulty walking, Dizziness, Impaired sensation  Visit Diagnosis: Repeated falls  Unsteadiness on feet  Other disturbances of skin sensation  Muscle weakness (generalized)     Problem List Patient Active Problem List   Diagnosis Date Noted  . Preoperative testing   . Radius fracture 08/20/2015  . Leukocytosis 08/20/2015  . Insomnia 08/20/2015  . Overweight (BMI 25.0-29.9) 08/10/2015  . S/P right TKA 08/09/2015  . S/P knee replacement 08/09/2015  . S/p reverse total shoulder arthroplasty 11/11/2014  . Vertigo 09/10/2013  . Nausea alone  08/15/2012  . Duodenal ulcer 08/15/2012  . Personal history of colonic polyps 12/11/2011  . ASTHMATIC BRONCHITIS, ACUTE 03/31/2007  . ALLERGIC RHINITIS 03/31/2007  . Esophageal reflux 03/31/2007   Raylene Everts, PT, DPT 04/26/16    4:57 PM    [facility] [address] [place], [place], [address] Phone: [phone]   Fax:  [phone]  Name: [name] MRN: [id number] Date of Birth: [birth year]

## 2016-05-01 ENCOUNTER — Ambulatory Visit: Payer: Commercial Managed Care - HMO | Admitting: Diagnostic Neuroimaging

## 2016-05-03 ENCOUNTER — Encounter: Payer: Self-pay | Admitting: Physical Therapy

## 2016-05-03 ENCOUNTER — Ambulatory Visit: Payer: Medicare HMO | Admitting: Physical Therapy

## 2016-05-03 DIAGNOSIS — M6281 Muscle weakness (generalized): Secondary | ICD-10-CM | POA: Diagnosis not present

## 2016-05-03 DIAGNOSIS — R2681 Unsteadiness on feet: Secondary | ICD-10-CM

## 2016-05-03 DIAGNOSIS — L218 Other seborrheic dermatitis: Secondary | ICD-10-CM | POA: Diagnosis not present

## 2016-05-03 DIAGNOSIS — L814 Other melanin hyperpigmentation: Secondary | ICD-10-CM | POA: Diagnosis not present

## 2016-05-03 DIAGNOSIS — D692 Other nonthrombocytopenic purpura: Secondary | ICD-10-CM | POA: Diagnosis not present

## 2016-05-03 DIAGNOSIS — D1801 Hemangioma of skin and subcutaneous tissue: Secondary | ICD-10-CM | POA: Diagnosis not present

## 2016-05-03 DIAGNOSIS — R208 Other disturbances of skin sensation: Secondary | ICD-10-CM

## 2016-05-03 DIAGNOSIS — R296 Repeated falls: Secondary | ICD-10-CM | POA: Diagnosis not present

## 2016-05-03 DIAGNOSIS — L900 Lichen sclerosus et atrophicus: Secondary | ICD-10-CM | POA: Diagnosis not present

## 2016-05-03 DIAGNOSIS — L720 Epidermal cyst: Secondary | ICD-10-CM | POA: Diagnosis not present

## 2016-05-03 DIAGNOSIS — L821 Other seborrheic keratosis: Secondary | ICD-10-CM | POA: Diagnosis not present

## 2016-05-03 NOTE — Therapy (Signed)
[facility] [address] [place], [place], [address] Phone: [phone]   Fax:  [phone]  Physical Therapy Treatment  Patient Details  Name: [name] MRN: [id number] Date of Birth: 08-Mar-1929 Referring Provider: Penni Bombard, MD  Encounter Date: 05/03/2016      PT End of Session - 05/03/16 1654    Visit Number 4   Number of Visits 13   Date for PT Re-Evaluation 06/04/16   Authorization Type [employer]-G code and progress note every 10th visit   PT Start Time 1400   PT Stop Time 1453   PT Time Calculation (min) 53 min   Activity Tolerance Patient tolerated treatment well   Behavior During Therapy [facility] for tasks assessed/performed      Past Medical History:  Diagnosis Date  . Allergic rhinitis   . Anxiety   . Arthritis   . Breast cancer ([place])    left breast cancer  . Complication of anesthesia    memory loss  . Concussion 2015   fell and hit head  . Depression    situational depression  . Diverticulosis   . GERD (gastroesophageal reflux disease)   . Headache(784.0)    sinus HAs  . Hyperlipidemia   . Neuromuscular disorder (HCC)    neuropathy- nerve damage in neck causes nerve damage in toes    Past Surgical History:  Procedure Laterality Date  . ABDOMINAL HYSTERECTOMY    . CHOLECYSTECTOMY N/A 03/04/2015   Procedure: LAPAROSCOPIC CHOLECYSTECTOMY WITH INTRAOPERATIVE CHOLANGIOGRAM;  Surgeon: Greer Pickerel, MD;  Location: [facility];  Service: General;  Laterality: N/A;  . ESOPHAGOGASTRODUODENOSCOPY N/A 08/15/2012   Procedure: ESOPHAGOGASTRODUODENOSCOPY (EGD);  Surgeon: Inda Castle, MD;  [facility];  Service: Endoscopy;  Laterality: N/A;  . MASTECTOMY     left  . NASAL SINUS SURGERY     2004  . ORIF WRIST FRACTURE Right 08/21/2015   Procedure: OPEN REDUCTION INTERNAL FIXATION (ORIF) WRIST FRACTURE;  Surgeon: Dayna Barker, MD;  Location: [facility];  Service: Plastics;  Laterality:  Right;  . REPLACEMENT TOTAL KNEE     left  . REVERSE SHOULDER ARTHROPLASTY Left 11/11/2014   Procedure: LEFT REVERSE SHOULDER ARTHROPLASTY;  Surgeon: Justice Britain, MD;  Location: [facility];  Service: Orthopedics;  Laterality: Left;  . SHOULDER SURGERY Bilateral    repairs, rot cuff repair  . STAPEDECTOMY Left    left ear  . TOTAL KNEE ARTHROPLASTY     right  . TOTAL KNEE ARTHROPLASTY Right 08/09/2015   Procedure: TOTAL KNEE ARTHROPLASTY;  Surgeon: Paralee Cancel, MD;  Location: [facility];  Service: Orthopedics;  Laterality: Right;    There were no vitals filed for this visit.      Subjective Assessment - 05/03/16 1442    Subjective No issues to report; husband asking how this therapy and the exercises she is doing is different from what she did after her TKA and [place]  Husband also had print out of order and asking about TENS and Iontophoresis.   Patient is accompained by: Family member   Pertinent History idiopathic polyneuropathy, repeated falls, dementia memory loss, recent R TKA, post concussive syndrome, breast CA   Limitations Walking   Patient Stated Goals To stop falling and improve balance   Currently in Pain? No/denies                         [facility] Adult PT Treatment/Exercise -  05/03/16 1654      Knee/Hip Exercises: Aerobic   Nustep L3 x 9 minutes with LE only due to discomfort in bilat UE             Balance Exercises - 05/03/16 1443      Balance Exercises: Standing   SLS Eyes open;Foam/compliant surface;Upper extremity support 2;4 reps  on rocker board x 5 seconds each   Rockerboard Anterior/posterior;Head turns;EO  squats x 10 reps   Other Standing Exercises Standing on compliant foam with feet together, partial tandem without UE support during dynamic UE movement-ball toss forwards and then with lateral turn to R or L.  Performing step ups on compliant foam forwards x 10 reps total with EC.           PT Education  - 05/03/16 1652    Education provided Yes   Education Details education on specific methods for balance training; deferred questions about use of UV light for nerve healing, education on safe activities to perform at home to challenge balance   Person(s) Educated Patient;Spouse   Methods Explanation   Comprehension Verbalized understanding          PT Short Term Goals - 04/24/16 1418      PT SHORT TERM GOAL #1   Title (TARGET DATE FOR STG 04/27/16) Pt will perform strengthening and balance HEP under the supervision of husband   Status New     PT SHORT TERM GOAL #2   Title Pt will participate in FGA assessment with LTG to be written   Baseline met on 04/24/16   Status Achieved     PT SHORT TERM GOAL #3   Title Pt will improve LE strength and balance with improvement in her ability to perform SLS on each leg >6 seconds   Baseline 1-2 seconds   Status New     PT SHORT TERM GOAL #4   Title Pt will ambulate up/down 8 stairs, no rails and over compliant surfaces x 200' with supervision and no episodes of LE foot drag/LOB   Status New           PT Long Term Goals - 04/24/16 1419      PT LONG TERM GOAL #1   Title (TARGET DATE FOR ALL LTG 05/20/16) Pt will improve safety with gait as indicated by an increase in gait velocity to >3.5 ft/sec   Baseline 2.69   Status New     PT LONG TERM GOAL #2   Title Pt will demonstrate decreased falls risk as indicated by FGA score of >25/30   Baseline 15/30   Status New     PT LONG TERM GOAL #3   Title Pt will demonstrate safe floor > furniture transfer at mod I level-D/C goal 04/24/16   Baseline unsafe to practice/perform due to recent knee replacement and hip pain   Status Deferred     PT LONG TERM GOAL #4   Title Pt will negotiate 8 stairs, no rails and will ambulate >1,000' over indoor and uneven outdoor surfaces with supervision and no tripping or LOB   Status New     PT LONG TERM GOAL #5   Title Pt will improve LE strength and  balance to be able to perform SLS x 10 seconds on each LE   Status New               Plan - 05/03/16 1654    Clinical Impression Statement Increased balance challenges today with addition  of compliant surfaces (rocker board and foam) during single limb support, narrow BOS, head turns and dynamic UE movement.  Pt tolerated well and husband pleased with added balance challenges.  Pt continues to demonstrate some anxiety with novel activities and added challenges and requires constant cues, encouragement and explanation of activities.  Pt also continues to report R medial knee pain and headache with certain balance activities; will continue to monitor and address.  Husband asking multiple questions about nerve healing and specific treatment interventions for nerves; deferred questions about use of UV light but did discuss prognosis of recovery and balance therapy to retrain balance reactions and strategies through strengthening and reaction time.   Clinical Impairments Affecting Rehab Potential idiopathic polyneuropathy   PT Treatment/Interventions ADLs/Self Care Home Management;DME Instruction;Gait training;Stair training;Functional mobility training;Therapeutic activities;Therapeutic exercise;Balance training;Neuromuscular re-education;Patient/family education;Orthotic Fit/Training;Vestibular   PT Next Visit Plan stretching for hip flexors, ADD, IT band-continue more dynamic balance training with vision removed, compliant surfaces   Consulted and Agree with Plan of Care Patient;Family member/caregiver   Family Member Consulted husband      Patient will benefit from skilled therapeutic intervention in order to improve the following deficits and impairments:  Decreased balance, Decreased cognition, Decreased strength, Difficulty walking, Dizziness, Impaired sensation  Visit Diagnosis: Repeated falls  Unsteadiness on feet  Other disturbances of skin sensation  Muscle weakness  (generalized)     Problem List Patient Active Problem List   Diagnosis Date Noted  . Preoperative testing   . Radius fracture 08/20/2015  . Leukocytosis 08/20/2015  . Insomnia 08/20/2015  . Overweight (BMI 25.0-29.9) 08/10/2015  . S/P right TKA 08/09/2015  . S/P knee replacement 08/09/2015  . S/p reverse total shoulder arthroplasty 11/11/2014  . Vertigo 09/10/2013  . Nausea alone 08/15/2012  . Duodenal ulcer 08/15/2012  . Personal history of colonic polyps 12/11/2011  . ASTHMATIC BRONCHITIS, ACUTE 03/31/2007  . ALLERGIC RHINITIS 03/31/2007  . Esophageal reflux 03/31/2007   Raylene Everts, PT, DPT 05/03/16    5:01 PM     [facility] [address] [place], [place], [address] Phone: [phone]   Fax:  [phone]  Name: TIJANA WALDER MRN: [id number] Date of Birth: 08-Sep-1929

## 2016-05-08 ENCOUNTER — Ambulatory Visit: Payer: Medicare HMO | Admitting: Physical Therapy

## 2016-05-08 ENCOUNTER — Encounter: Payer: Self-pay | Admitting: Physical Therapy

## 2016-05-08 DIAGNOSIS — R2681 Unsteadiness on feet: Secondary | ICD-10-CM | POA: Diagnosis not present

## 2016-05-08 DIAGNOSIS — R296 Repeated falls: Secondary | ICD-10-CM | POA: Diagnosis not present

## 2016-05-08 DIAGNOSIS — M6281 Muscle weakness (generalized): Secondary | ICD-10-CM | POA: Diagnosis not present

## 2016-05-08 DIAGNOSIS — R208 Other disturbances of skin sensation: Secondary | ICD-10-CM

## 2016-05-09 NOTE — Therapy (Signed)
[facility] [address] [place], [place], [address] Phone: [phone]   Fax:  [phone]  Physical Therapy Treatment  Patient Details  Name: [name] MRN: [id number] Date of Birth: 1929/05/02 Referring Provider: Penni Bombard, MD  Encounter Date: 05/08/2016      PT End of Session - 05/08/16 1536    Visit Number 5   Number of Visits 13   Date for PT Re-Evaluation 06/04/16   Authorization Type [employer]-G code and progress note every 10th visit   PT Start Time 1533   PT Stop Time 1615   PT Time Calculation (min) 42 min   Activity Tolerance Patient tolerated treatment well   Behavior During Therapy [facility] for tasks assessed/performed      Past Medical History:  Diagnosis Date  . Allergic rhinitis   . Anxiety   . Arthritis   . Breast cancer ([place])    left breast cancer  . Complication of anesthesia    memory loss  . Concussion 2015   fell and hit head  . Depression    situational depression  . Diverticulosis   . GERD (gastroesophageal reflux disease)   . Headache(784.0)    sinus HAs  . Hyperlipidemia   . Neuromuscular disorder (HCC)    neuropathy- nerve damage in neck causes nerve damage in toes    Past Surgical History:  Procedure Laterality Date  . ABDOMINAL HYSTERECTOMY    . CHOLECYSTECTOMY N/A 03/04/2015   Procedure: LAPAROSCOPIC CHOLECYSTECTOMY WITH INTRAOPERATIVE CHOLANGIOGRAM;  Surgeon: Greer Pickerel, MD;  Location: [facility];  Service: General;  Laterality: N/A;  . ESOPHAGOGASTRODUODENOSCOPY N/A 08/15/2012   Procedure: ESOPHAGOGASTRODUODENOSCOPY (EGD);  Surgeon: Inda Castle, MD;  Location: [facility];  Service: Endoscopy;  Laterality: N/A;  . MASTECTOMY     left  . NASAL SINUS SURGERY     2004  . ORIF WRIST FRACTURE Right 08/21/2015   Procedure: OPEN REDUCTION INTERNAL FIXATION (ORIF) WRIST FRACTURE;  Surgeon: Dayna Barker, MD;  Location: [facility];  Service: Plastics;  Laterality:  Right;  . REPLACEMENT TOTAL KNEE     left  . REVERSE SHOULDER ARTHROPLASTY Left 11/11/2014   Procedure: LEFT REVERSE SHOULDER ARTHROPLASTY;  Surgeon: Justice Britain, MD;  Location: [facility];  Service: Orthopedics;  Laterality: Left;  . SHOULDER SURGERY Bilateral    repairs, rot cuff repair  . STAPEDECTOMY Left    left ear  . TOTAL KNEE ARTHROPLASTY     right  . TOTAL KNEE ARTHROPLASTY Right 08/09/2015   Procedure: TOTAL KNEE ARTHROPLASTY;  Surgeon: Paralee Cancel, MD;  Location: [facility];  Service: Orthopedics;  Laterality: Right;    There were no vitals filed for this visit.      Subjective Assessment - 05/08/16 1535    Subjective no new complaints. No falls to report.    Patient is accompained by: Family member   Pertinent History idiopathic polyneuropathy, repeated falls, dementia memory loss, recent R TKA, post concussive syndrome, breast CA   Limitations Walking   Patient Stated Goals To stop falling and improve balance   Currently in Pain? No/denies           [facility] Adult PT Treatment/Exercise - 05/08/16 1230      Knee/Hip Exercises: Stretches   Hip Flexor Stretch Right;Left;2 reps;60 seconds;Limitations   Hip Flexor Stretch Limitations off side of mat, max multimodal cues needed with increased time to find stretch position that did not increase pain in her back  Balance Exercises - 05/08/16 1607      Balance Exercises: Standing   Standing Eyes Closed Narrow base of support (BOS);Foam/compliant surface;Other reps (comment);30 secs;Limitations;Head turns   Wall Bumps Hip   Wall Bumps-Hips Eyes opened;Anterior/posterior;Foam/compliant surface;Limitations   Balance Beam standing in parallel bars with feet across blue foam beam: alternating fwd stepping to floor and then back onto beam with emphasis on weight shifting x 10 reps each side, alternating bwd stepping to floor with emphasis on weight shifting and back onto beam x 10 reps each leg. Light UE support on bars  with min guard to min assist for balance, cues on weight shifting and step length to assist with balance.                                   Balance Exercises: Standing   Standing Eyes Closed Limitations on airex in corner with chair in front of her for safety: narrow base of support EC no head movements, progressing to EC head movements left<>right, up<>down and diagonals both ways. pt with light fingertip support on chair/wall as needed for balance with min guard to min assist for balance. cues on posture and weight shifting for assist with balance as well.                         Wall Bumps Limitations blue foam beam next to wall with pt standing across beam with back to wall: wall bumps performed with cues on correct form/technique and arm/hip motion relationship              PT Short Term Goals - 04/24/16 1418      PT SHORT TERM GOAL #1   Title (TARGET DATE FOR STG 04/27/16) Pt will perform strengthening and balance HEP under the supervision of husband   Status New     PT SHORT TERM GOAL #2   Title Pt will participate in FGA assessment with LTG to be written   Baseline met on 04/24/16   Status Achieved     PT SHORT TERM GOAL #3   Title Pt will improve LE strength and balance with improvement in her ability to perform SLS on each leg >6 seconds   Baseline 1-2 seconds   Status New     PT SHORT TERM GOAL #4   Title Pt will ambulate up/down 8 stairs, no rails and over compliant surfaces x 200' with supervision and no episodes of LE foot drag/LOB   Status New           PT Long Term Goals - 04/24/16 1419      PT LONG TERM GOAL #1   Title (TARGET DATE FOR ALL LTG 05/20/16) Pt will improve safety with gait as indicated by an increase in gait velocity to >3.5 ft/sec   Baseline 2.69   Status New     PT LONG TERM GOAL #2   Title Pt will demonstrate decreased falls risk as indicated by FGA score of >25/30   Baseline 15/30   Status New     PT LONG TERM GOAL #3   Title Pt will  demonstrate safe floor > furniture transfer at mod I level-D/C goal 04/24/16   Baseline unsafe to practice/perform due to recent knee replacement and hip pain   Status Deferred     PT LONG TERM GOAL #4   Title Pt will negotiate 8 stairs,  no rails and will ambulate >1,000' over indoor and uneven outdoor surfaces with supervision and no tripping or LOB   Status New     PT LONG TERM GOAL #5   Title Pt will improve LE strength and balance to be able to perform SLS x 10 seconds on each LE   Status New            Plan - 05/09/16 1006    Clinical Impression Statement Today's skilled session attempted to address bil hip tightness with hip flexor stretching, however pt reported feeling the stretch more in her IT bands/hamstrings. did not sent this home today due to increased difficulty with getting in position, will continue to address this in clinic. Remainder of session addressed balance with no issues reported. Pt does need increased time for processing and to feeel as if she is in a "safe" enviroment to fully challenge herself. Pt is progressing and should benefit from continued PT to progress toward unmet goals.                   Clinical Impairments Affecting Rehab Potential idiopathic polyneuropathy   PT Treatment/Interventions ADLs/Self Care Home Management;DME Instruction;Gait training;Stair training;Functional mobility training;Therapeutic activities;Therapeutic exercise;Balance training;Neuromuscular re-education;Patient/family education;Orthotic Fit/Training;Vestibular   PT Next Visit Plan advance balance HEP (pt and spouse report current one is getting "easy";continued to work on balance on complaint surfaces and with vision removed.   Consulted and Agree with Plan of Care Patient;Family member/caregiver   Family Member Consulted husband      Patient will benefit from skilled therapeutic intervention in order to improve the following deficits and impairments:  Decreased balance,  Decreased cognition, Decreased strength, Difficulty walking, Dizziness, Impaired sensation  Visit Diagnosis: Repeated falls  Unsteadiness on feet  Other disturbances of skin sensation  Muscle weakness (generalized)     Problem List Patient Active Problem List   Diagnosis Date Noted  . Preoperative testing   . Radius fracture 08/20/2015  . Leukocytosis 08/20/2015  . Insomnia 08/20/2015  . Overweight (BMI 25.0-29.9) 08/10/2015  . S/P right TKA 08/09/2015  . S/P knee replacement 08/09/2015  . S/p reverse total shoulder arthroplasty 11/11/2014  . Vertigo 09/10/2013  . Nausea alone 08/15/2012  . Duodenal ulcer 08/15/2012  . Personal history of colonic polyps 12/11/2011  . ASTHMATIC BRONCHITIS, ACUTE 03/31/2007  . ALLERGIC RHINITIS 03/31/2007  . Esophageal reflux 03/31/2007    Willow Ora, PTA, [facility] [address] [place], [place] [address] [phone] 05/09/16, 2:31 PM   Name: KELENA GARROW MRN: [id number] Date of Birth: 18-Oct-1929

## 2016-05-10 DIAGNOSIS — R413 Other amnesia: Secondary | ICD-10-CM | POA: Diagnosis not present

## 2016-05-10 DIAGNOSIS — R51 Headache: Secondary | ICD-10-CM | POA: Diagnosis not present

## 2016-05-10 DIAGNOSIS — Z6828 Body mass index (BMI) 28.0-28.9, adult: Secondary | ICD-10-CM | POA: Diagnosis not present

## 2016-05-10 DIAGNOSIS — J302 Other seasonal allergic rhinitis: Secondary | ICD-10-CM | POA: Diagnosis not present

## 2016-05-10 DIAGNOSIS — F419 Anxiety disorder, unspecified: Secondary | ICD-10-CM | POA: Diagnosis not present

## 2016-05-11 ENCOUNTER — Ambulatory Visit: Payer: Medicare HMO | Admitting: Physical Therapy

## 2016-05-15 ENCOUNTER — Encounter: Payer: Self-pay | Admitting: Physical Therapy

## 2016-05-15 ENCOUNTER — Ambulatory Visit: Payer: Medicare HMO | Attending: Diagnostic Neuroimaging | Admitting: Physical Therapy

## 2016-05-15 DIAGNOSIS — R2681 Unsteadiness on feet: Secondary | ICD-10-CM

## 2016-05-15 DIAGNOSIS — R208 Other disturbances of skin sensation: Secondary | ICD-10-CM

## 2016-05-15 DIAGNOSIS — R296 Repeated falls: Secondary | ICD-10-CM | POA: Diagnosis not present

## 2016-05-15 DIAGNOSIS — M6281 Muscle weakness (generalized): Secondary | ICD-10-CM

## 2016-05-15 NOTE — Therapy (Signed)
[facility] [address] [place], [place], [address] Phone: [phone]   Fax:  [phone]  Physical Therapy Treatment  Patient Details  Name: [name] MRN: [id number] Date of Birth: Jan 29, 1929 Referring Provider: Penni Bombard, MD  Encounter Date: 05/15/2016      PT End of Session - 05/15/16 1455    Visit Number 6   Number of Visits 13   Date for PT Re-Evaluation 06/04/16   Authorization Type [employer]-G code and progress note every 10th visit   PT Start Time 1450   PT Stop Time 1530   PT Time Calculation (min) 40 min   Equipment Utilized During Treatment Gait belt   Activity Tolerance Patient tolerated treatment well   Behavior During Therapy [facility] for tasks assessed/performed      Past Medical History:  Diagnosis Date  . Allergic rhinitis   . Anxiety   . Arthritis   . Breast cancer ([place])    left breast cancer  . Complication of anesthesia    memory loss  . Concussion 2015   fell and hit head  . Depression    situational depression  . Diverticulosis   . GERD (gastroesophageal reflux disease)   . Headache(784.0)    sinus HAs  . Hyperlipidemia   . Neuromuscular disorder (HCC)    neuropathy- nerve damage in neck causes nerve damage in toes    Past Surgical History:  Procedure Laterality Date  . ABDOMINAL HYSTERECTOMY    . CHOLECYSTECTOMY N/A 03/04/2015   Procedure: LAPAROSCOPIC CHOLECYSTECTOMY WITH INTRAOPERATIVE CHOLANGIOGRAM;  Surgeon: Greer Pickerel, MD;  Location: [facility];  Service: General;  Laterality: N/A;  . ESOPHAGOGASTRODUODENOSCOPY N/A 08/15/2012   Procedure: ESOPHAGOGASTRODUODENOSCOPY (EGD);  Surgeon: Inda Castle, MD;  [facility];  Service: Endoscopy;  Laterality: N/A;  . MASTECTOMY     left  . NASAL SINUS SURGERY     2004  . ORIF WRIST FRACTURE Right 08/21/2015   Procedure: OPEN REDUCTION INTERNAL FIXATION (ORIF) WRIST FRACTURE;  Surgeon: Dayna Barker, MD;   Location: [facility];  Service: Plastics;  Laterality: Right;  . REPLACEMENT TOTAL KNEE     left  . REVERSE SHOULDER ARTHROPLASTY Left 11/11/2014   Procedure: LEFT REVERSE SHOULDER ARTHROPLASTY;  Surgeon: Justice Britain, MD;  Location: [facility];  Service: Orthopedics;  Laterality: Left;  . SHOULDER SURGERY Bilateral    repairs, rot cuff repair  . STAPEDECTOMY Left    left ear  . TOTAL KNEE ARTHROPLASTY     right  . TOTAL KNEE ARTHROPLASTY Right 08/09/2015   Procedure: TOTAL KNEE ARTHROPLASTY;  Surgeon: Paralee Cancel, MD;  [facility];  Service: Orthopedics;  Laterality: Right;    There were no vitals filed for this visit.      Subjective Assessment - 05/15/16 1452    Subjective no new complaints. No falls to report. Does have some hip pain/soreness when standing in tandem and single leg stance. Resolves when activity stops. Most likely muscle soreness from exercises.    Patient is accompained by: Family member   Pertinent History idiopathic polyneuropathy, repeated falls, dementia memory loss, recent R TKA, post concussive syndrome, breast CA   Limitations Walking   Patient Stated Goals To stop falling and improve balance   Currently in Pain? No/denies            [facility] Adult PT Treatment/Exercise - 05/15/16 1501      Transfers   Transfers Sit to [employer]  to Stand 5: Supervision;With upper extremity assist;From bed;From chair/3-in-1   Stand to Sit 5: Supervision;To bed;To chair/3-in-1     Ambulation/Gait   Ambulation/Gait Yes   Ambulation/Gait Assistance 5: Supervision;4: Min guard   Ambulation/Gait Assistance Details once off gravel/grass and onto paved surfaces had pt scan enviroment with counting/naming things around her   Ambulation Distance (Feet) 500 Feet   Assistive device None   Gait Pattern Step-through pattern;Decreased stride length   Ambulation Surface Level;Indoor;Unlevel;Outdoor;Paved;Gravel;Grass   Stairs Yes   Stairs Assistance 5:  Supervision;4: Min guard;4: Min assist   Stairs Assistance Details (indicate cue type and reason) min guard up with no rails, min assist for balance with descending with no rails. supervision to mod I with single rail use to ascend/descend    Stair Management Technique No rails;One rail Right;Alternating pattern;Step to pattern;Forwards   Number of Stairs 4  x 2 reps   Height of Stairs 6             Balance Exercises - 05/15/16 1524      Balance Exercises: Standing   Standing Eyes Closed Limitations on pillows in corner with chair in front of her for safety: narrow base of support EC no head movements, progressing to EC head movements left<>right, up<>down . pt with light fingertip support on chair/wall as needed for balance with min guard to min assist for balance. cues on posture and weight shifting for assist with balance as well.                                   PT Short Term Goals - 05/15/16 1458      PT SHORT TERM GOAL #1   Title (TARGET DATE FOR STG 04/27/16) Pt will perform strengthening and balance HEP under the supervision of husband   Baseline 05/15/16: met for current HEP which pt report is getting too easy- will plan to update at next apt   Status Achieved     PT SHORT TERM GOAL #2   Title Pt will participate in FGA assessment with LTG to be written   Baseline met on 04/24/16   Status Achieved     PT SHORT TERM GOAL #3   Title Pt will improve LE strength and balance with improvement in her ability to perform SLS on each leg >6 seconds   Baseline 05/15/16: can stand for up to 10 sec's with intermittent UE support for balance with each leg   Status Partially Met     PT SHORT TERM GOAL #4   Title Pt will ambulate up/down 8 stairs, no rails and over compliant surfaces x 200' with supervision and no episodes of LE foot drag/LOB   Baseline 05/15/16: pt is at supervision for up to 200 feet on indoor/outdoor surfaces, however pt needs at least 1 rail to ascend/descend  stairs safely   Status Partially Met           PT Long Term Goals - 04/24/16 1419      PT LONG TERM GOAL #1   Title (TARGET DATE FOR ALL LTG 05/20/16) Pt will improve safety with gait as indicated by an increase in gait velocity to >3.5 ft/sec   Baseline 2.69   Status New     PT LONG TERM GOAL #2   Title Pt will demonstrate decreased falls risk as indicated by FGA score of >25/30   Baseline 15/30   Status New  PT LONG TERM GOAL #3   Title Pt will demonstrate safe floor > furniture transfer at mod I level-D/C goal 04/24/16   Baseline unsafe to practice/perform due to recent knee replacement and hip pain   Status Deferred     PT LONG TERM GOAL #4   Title Pt will negotiate 8 stairs, no rails and will ambulate >1,000' over indoor and uneven outdoor surfaces with supervision and no tripping or LOB   Status New     PT LONG TERM GOAL #5   Title Pt will improve LE strength and balance to be able to perform SLS x 10 seconds on each LE   Status New            Plan - 05/15/16 1455    Clinical Impression Statement Pt has met 2/4 STGs and partially met the remaining 2 STGs. Pt is making steady progress toward remaining LTGs. Remainder of today's session addressed balance after goals were addressed. Pt should benefit from continued PT to progress toward unmet goals.    Clinical Impairments Affecting Rehab Potential idiopathic polyneuropathy   PT Treatment/Interventions ADLs/Self Care Home Management;DME Instruction;Gait training;Stair training;Functional mobility training;Therapeutic activities;Therapeutic exercise;Balance training;Neuromuscular re-education;Patient/family education;Orthotic Fit/Training;Vestibular   PT Next Visit Plan advance balance HEP (pt and spouse report current one is getting "easy";continued to work on balance on complaint surfaces and with vision removed.   Consulted and Agree with Plan of Care Patient;Family member/caregiver   Family Member Consulted husband       Patient will benefit from skilled therapeutic intervention in order to improve the following deficits and impairments:  Decreased balance, Decreased cognition, Decreased strength, Difficulty walking, Dizziness, Impaired sensation  Visit Diagnosis: Repeated falls  Unsteadiness on feet  Other disturbances of skin sensation  Muscle weakness (generalized)     Problem List Patient Active Problem List   Diagnosis Date Noted  . Preoperative testing   . Radius fracture 08/20/2015  . Leukocytosis 08/20/2015  . Insomnia 08/20/2015  . Overweight (BMI 25.0-29.9) 08/10/2015  . S/P right TKA 08/09/2015  . S/P knee replacement 08/09/2015  . S/p reverse total shoulder arthroplasty 11/11/2014  . Vertigo 09/10/2013  . Nausea alone 08/15/2012  . Duodenal ulcer 08/15/2012  . Personal history of colonic polyps 12/11/2011  . ASTHMATIC BRONCHITIS, ACUTE 03/31/2007  . ALLERGIC RHINITIS 03/31/2007  . Esophageal reflux 03/31/2007    Willow Ora, PTA, [facility] [address] [place], [place] [address] [phone] 05/15/16, 10:30 PM   Name: VERN PRESTIA MRN: [id number] Date of Birth: Sep 01, 1929

## 2016-05-18 ENCOUNTER — Ambulatory Visit: Payer: Medicare HMO | Admitting: Physical Therapy

## 2016-05-21 ENCOUNTER — Ambulatory Visit: Payer: Medicare HMO | Admitting: Physical Therapy

## 2016-05-21 ENCOUNTER — Encounter: Payer: Self-pay | Admitting: Physical Therapy

## 2016-05-21 DIAGNOSIS — M6281 Muscle weakness (generalized): Secondary | ICD-10-CM

## 2016-05-21 DIAGNOSIS — R2681 Unsteadiness on feet: Secondary | ICD-10-CM | POA: Diagnosis not present

## 2016-05-21 DIAGNOSIS — R208 Other disturbances of skin sensation: Secondary | ICD-10-CM

## 2016-05-21 DIAGNOSIS — R296 Repeated falls: Secondary | ICD-10-CM | POA: Diagnosis not present

## 2016-05-21 NOTE — Therapy (Signed)
[facility] [address] [place], [place], [address] Phone: [phone]   Fax:  [phone]  Physical Therapy Treatment  Patient Details  Name: MERYN SARRACINO MRN: [id number] Date of Birth: 08-Oct-1929 Referring Provider: Penni Bombard, MD  Encounter Date: 05/21/2016      PT End of Session - 05/21/16 1643    Visit Number 8   Number of Visits 13   Date for PT Re-Evaluation 06/04/16   Authorization Type [employer] H[employer]-G code and progress note every 10th visit   PT Start Time 1533   PT Stop Time 1620   PT Time Calculation (min) 47 min   Activity Tolerance Treatment limited secondary to agitation   Behavior During Therapy Restless;Anxious      Past Medical History:  Diagnosis Date  . Allergic rhinitis   . Anxiety   . Arthritis   . Breast cancer ([facility])    left breast cancer  . Complication of anesthesia    memory loss  . Concussion 2015   fell and hit head  . Depression    situational depression  . Diverticulosis   . GERD (gastroesophageal reflux disease)   . Headache(784.0)    sinus HAs  . Hyperlipidemia   . Neuromuscular disorder (HCC)    neuropathy- nerve damage in neck causes nerve damage in toes    Past Surgical History:  Procedure Laterality Date  . ABDOMINAL HYSTERECTOMY    . CHOLECYSTECTOMY N/A 03/04/2015   Procedure: LAPAROSCOPIC CHOLECYSTECTOMY WITH INTRAOPERATIVE CHOLANGIOGRAM;  Surgeon: Greer Pickerel, MD;  Location: [facility];  Service: General;  Laterality: N/A;  . ESOPHAGOGASTRODUODENOSCOPY N/A 08/15/2012   Procedure: ESOPHAGOGASTRODUODENOSCOPY (EGD);  Surgeon: Inda Castle, MD;  Location: [facility];  Service: Endoscopy;  Laterality: N/A;  . MASTECTOMY     left  . NASAL SINUS SURGERY     2004  . ORIF WRIST FRACTURE Right 08/21/2015   Procedure: OPEN REDUCTION INTERNAL FIXATION (ORIF) WRIST FRACTURE;  Surgeon: Dayna Barker, MD;  Location: [facility];  Service: Plastics;  Laterality: Right;  .  REPLACEMENT TOTAL KNEE     left  . REVERSE SHOULDER ARTHROPLASTY Left 11/11/2014   Procedure: LEFT REVERSE SHOULDER ARTHROPLASTY;  Surgeon: Justice Britain, MD;  Location: [facility];  Service: Orthopedics;  Laterality: Left;  . SHOULDER SURGERY Bilateral    repairs, rot cuff repair  . STAPEDECTOMY Left    left ear  . TOTAL KNEE ARTHROPLASTY     right  . TOTAL KNEE ARTHROPLASTY Right 08/09/2015   Procedure: TOTAL KNEE ARTHROPLASTY;  Surgeon: Paralee Cancel, MD;  Location: [facility];  Service: Orthopedics;  Laterality: Right;    There were no vitals filed for this visit.      Subjective Assessment - 05/21/16 1537    Subjective No new complaints; reports performing her exercises before coming.  Still having difficulty with maintaining SLS x 10 seconds   Patient is accompained by: Family member   Pertinent History idiopathic polyneuropathy, repeated falls, dementia memory loss, recent R TKA, post concussive syndrome, breast CA   Limitations Walking   Patient Stated Goals To stop falling and improve balance   Currently in Pain? No/denies            [facility] PT Assessment - 05/21/16 1540      Standardized Balance Assessment   10 Meter Walk 8.97 sec or 3.65 ft/sec     High Level Balance   High Level Balance Comments SLS RLE: 1 second, LLE: 2-3 seconds  Functional Gait  Assessment   Gait assessed  Yes   Gait Level Surface Walks 20 ft in less than 7 sec but greater than 5.5 sec, uses assistive device, slower speed, mild gait deviations, or deviates 6-10 in outside of the 12 in walkway width.   Change in Gait Speed Able to change speed, demonstrates mild gait deviations, deviates 6-10 in outside of the 12 in walkway width, or no gait deviations, unable to achieve a major change in velocity, or uses a change in velocity, or uses an assistive device.   Gait with Horizontal Head Turns Performs head turns smoothly with slight change in gait velocity (eg, minor disruption to smooth gait path),  deviates 6-10 in outside 12 in walkway width, or uses an assistive device.   Gait with Vertical Head Turns Performs head turns with no change in gait. Deviates no more than 6 in outside 12 in walkway width.   Gait and Pivot Turn Pivot turns safely within 3 sec and stops quickly with no loss of balance.   Step Over Obstacle Is able to step over one shoe box (4.5 in total height) but must slow down and adjust steps to clear box safely. May require verbal cueing.   Gait with Narrow Base of Support Ambulates less than 4 steps heel to toe or cannot perform without assistance.   Gait with Eyes Closed Walks 20 ft, slow speed, abnormal gait pattern, evidence for imbalance, deviates 10-15 in outside 12 in walkway width. Requires more than 9 sec to ambulate 20 ft.   Ambulating Backwards Walks 20 ft, no assistive devices, good speed, no evidence for imbalance, normal gait   Steps Alternating feet, must use rail.   Total Score 19   FGA comment: 19/30                           Balance Exercises - 05/21/16 1633      Balance Exercises: Standing   Tandem Stance Eyes open;Upper extremity support 2;3 reps;10 secs  in corner   Wall Bumps-Hips Eyes opened;Right/left (lateral);10 reps   Other Standing Exercises Attempted to perform anterior/posterior weight shifting with feet in tandem with verbal and tactile cues to fully weight shift pelvis forwards but pt became very fearful/anxious of falling and asking repeatedly for rationalization for the exercise; due to increased frustration, ceased activity.           PT Education - 05/21/16 1643    Education provided Yes   Education Details Progress made and areas of continued focus for therapy; will review HEP next session   Person(s) Educated Patient;Spouse   Methods Explanation   Comprehension Verbal cues required;Tactile cues required;Need further instruction          PT Short Term Goals - 05/15/16 1458      PT SHORT TERM GOAL #1    Title (TARGET DATE FOR STG 04/27/16) Pt will perform strengthening and balance HEP under the supervision of husband   Baseline 05/15/16: met for current HEP which pt report is getting too easy- will plan to update at next apt   Status Achieved     PT SHORT TERM GOAL #2   Title Pt will participate in FGA assessment with LTG to be written   Baseline met on 04/24/16   Status Achieved     PT SHORT TERM GOAL #3   Title Pt will improve LE strength and balance with improvement in her ability to perform SLS  on each leg >6 seconds   Baseline 05/15/16: can stand for up to 10 sec's with intermittent UE support for balance with each leg   Status Partially Met     PT SHORT TERM GOAL #4   Title Pt will ambulate up/down 8 stairs, no rails and over compliant surfaces x 200' with supervision and no episodes of LE foot drag/LOB   Baseline 05/15/16: pt is at supervision for up to 200 feet on indoor/outdoor surfaces, however pt needs at least 1 rail to ascend/descend stairs safely   Status Partially Met           PT Long Term Goals - 05/21/16 1655      PT LONG TERM GOAL #1   Title (REVISED TARGET DATE FOR ALL LTG 06/04/16) Pt will improve safety with gait as indicated by an increase in gait velocity to >3.5 ft/sec   Baseline 3.65 on 05/21/16   Status Achieved     PT LONG TERM GOAL #2   Title Pt will demonstrate decreased falls risk as indicated by FGA score of >22/30   Baseline 19/30 on 05/21/16   Status Revised     PT LONG TERM GOAL #4   Title Pt will negotiate 8 stairs alternating sequence with one rail Mod I and will ambulate >1,000' over indoor and uneven outdoor surfaces with supervision and no tripping or LOB   Status Revised     PT LONG TERM GOAL #5   Title Pt will improve LE strength and balance to be able to perform SLS x 5-7 seconds on each LE   Baseline 2-3 seconds on each LE on 05/21/16   Status Revised               Plan - 05/21/16 1644    Clinical Impression Statement Treatment  session today focused on assessing pt's progress with FGA, gait speed and SLS.  Pt FGA improved to 19/30 but still below cut off for high falls risk; balance during SLS still limited to 2-3 seconds per LE.  Gait speed did improve to >3 ft/sec.  During standing balance assessments and activities today pt becoming increasingly anxious and restless and fearful of falling when without UE support.  Attempted to explain rationale for challenging balance in clinic without UE support but pt continued to state, "I would never do this without holding on so why should I here?!".  Pt continues to present with impaired attention and motor planning and significant fear of falling.  Will continue to address balance deficits, educate on safety at home and progress HEP as needed for remainder of visits.  POC updated for 2x/week x 2 more weeks for a total of 8 weeks.     Rehab Potential Good   Clinical Impairments Affecting Rehab Potential idiopathic polyneuropathy   PT Frequency 2x / week   PT Duration 8 weeks  2 more weeks for total of 8 weeks   PT Treatment/Interventions ADLs/Self Care Home Management;DME Instruction;Gait training;Stair training;Functional mobility training;Therapeutic activities;Therapeutic exercise;Balance training;Neuromuscular re-education;Patient/family education;Orthotic Fit/Training;Vestibular   PT Next Visit Plan review and advance balance HEP;continued to work on balance on complaint surfaces and with vision removed and narrow BOS with weight shifts   Consulted and Agree with Plan of Care Patient;Family member/caregiver   Family Member Consulted husband      Patient will benefit from skilled therapeutic intervention in order to improve the following deficits and impairments:  Decreased balance, Decreased cognition, Decreased strength, Difficulty walking, Dizziness, Impaired sensation  Visit  Diagnosis: Repeated falls  Unsteadiness on feet  Other disturbances of skin sensation  Muscle  weakness (generalized)     Problem List Patient Active Problem List   Diagnosis Date Noted  . Preoperative testing   . Radius fracture 08/20/2015  . Leukocytosis 08/20/2015  . Insomnia 08/20/2015  . Overweight (BMI 25.0-29.9) 08/10/2015  . S/P right TKA 08/09/2015  . S/P knee replacement 08/09/2015  . S/p reverse total shoulder arthroplasty 11/11/2014  . Vertigo 09/10/2013  . Nausea alone 08/15/2012  . Duodenal ulcer 08/15/2012  . Personal history of colonic polyps 12/11/2011  . ASTHMATIC BRONCHITIS, ACUTE 03/31/2007  . ALLERGIC RHINITIS 03/31/2007  . Esophageal reflux 03/31/2007    Raylene Everts, PT, DPT 05/21/16    4:56 PM    [facility] [address] [place], [place], [address] Phone: [phone]   Fax:  [phone]  Name: HEDI BARKAN MRN: [id number] Date of Birth: 08/11/1929

## 2016-05-22 ENCOUNTER — Ambulatory Visit: Payer: Medicare HMO | Admitting: Physical Therapy

## 2016-05-25 ENCOUNTER — Encounter: Payer: Self-pay | Admitting: Physical Therapy

## 2016-05-25 ENCOUNTER — Ambulatory Visit: Payer: Medicare HMO | Admitting: Physical Therapy

## 2016-05-25 DIAGNOSIS — R208 Other disturbances of skin sensation: Secondary | ICD-10-CM | POA: Diagnosis not present

## 2016-05-25 DIAGNOSIS — M6281 Muscle weakness (generalized): Secondary | ICD-10-CM | POA: Diagnosis not present

## 2016-05-25 DIAGNOSIS — R296 Repeated falls: Secondary | ICD-10-CM | POA: Diagnosis not present

## 2016-05-25 DIAGNOSIS — R2681 Unsteadiness on feet: Secondary | ICD-10-CM | POA: Diagnosis not present

## 2016-05-25 NOTE — Therapy (Signed)
[facility] [address] [place], [place], [address] Phone: [phone]   Fax:  [phone]  Physical Therapy Treatment  Patient Details  Name: [name] MRN: [id number] Date of Birth: 17-Aug-1929 Referring Provider: Penni Bombard, MD  Encounter Date: 05/25/2016      PT End of Session - 05/25/16 1507    Visit Number 9   Number of Visits 13   Date for PT Re-Evaluation 06/04/16   Authorization Type [employer]-G code and progress note every 10th visit   PT Start Time 1407  patient arrived late    PT Stop Time 1450   PT Time Calculation (min) 43 min   Activity Tolerance Treatment limited secondary to agitation;Patient tolerated treatment well   Behavior During Therapy Anxious      Past Medical History:  Diagnosis Date  . Allergic rhinitis   . Anxiety   . Arthritis   . Breast cancer ([facility])    left breast cancer  . Complication of anesthesia    memory loss  . Concussion 2015   fell and hit head  . Depression    situational depression  . Diverticulosis   . GERD (gastroesophageal reflux disease)   . Headache(784.0)    sinus HAs  . Hyperlipidemia   . Neuromuscular disorder (HCC)    neuropathy- nerve damage in neck causes nerve damage in toes    Past Surgical History:  Procedure Laterality Date  . ABDOMINAL HYSTERECTOMY    . CHOLECYSTECTOMY N/A 03/04/2015   Procedure: LAPAROSCOPIC CHOLECYSTECTOMY WITH INTRAOPERATIVE CHOLANGIOGRAM;  Surgeon: Greer Pickerel, MD;  Location: [facility];  Service: General;  Laterality: N/A;  . ESOPHAGOGASTRODUODENOSCOPY N/A 08/15/2012   Procedure: ESOPHAGOGASTRODUODENOSCOPY (EGD);  Surgeon: Inda Castle, MD;  Location: [facility];  Service: Endoscopy;  Laterality: N/A;  . MASTECTOMY     left  . NASAL SINUS SURGERY     2004  . ORIF WRIST FRACTURE Right 08/21/2015   Procedure: OPEN REDUCTION INTERNAL FIXATION (ORIF) WRIST FRACTURE;  Surgeon: Dayna Barker, MD;  Location: [facility];  Service: Plastics;  Laterality: Right;  . REPLACEMENT TOTAL KNEE     left  . REVERSE SHOULDER ARTHROPLASTY Left 11/11/2014   Procedure: LEFT REVERSE SHOULDER ARTHROPLASTY;  Surgeon: Justice Britain, MD;  Location: [facility];  Service: Orthopedics;  Laterality: Left;  . SHOULDER SURGERY Bilateral    repairs, rot cuff repair  . STAPEDECTOMY Left    left ear  . TOTAL KNEE ARTHROPLASTY     right  . TOTAL KNEE ARTHROPLASTY Right 08/09/2015   Procedure: TOTAL KNEE ARTHROPLASTY;  Surgeon: Paralee Cancel, MD;  Location: [facility];  Service: Orthopedics;  Laterality: Right;    There were no vitals filed for this visit.      Subjective Assessment - 05/25/16 1409    Subjective Patient denies any new complaints or falls since last visit. Patient and patient's husband brought current HEP to review with PT during today's session.    Patient is accompained by: Family member   Pertinent History idiopathic polyneuropathy, repeated falls, dementia memory loss, recent R TKA, post concussive syndrome, breast CA   Limitations Walking   Patient Stated Goals To stop falling and improve balance   Currently in Pain? Other (Comment)  reports some discomfort in R foot. Patient's husband reports he believes it is due to the orthotic in R shoe.         PT assessed patient's R foot and R orthotic. PT did not note any  abnormalities or a cause of the R foot discomfort. PT recommended patient and patient's husband contact and/or visit [facility] as this is where the patient received her orthotic approximately 1 year ago.                  [facility] Adult PT Treatment/Exercise - 05/25/16 1405      Transfers   Transfers Sit to [employer] Transfers   Sit to Stand 5: Supervision;With upper extremity assist;From bed;From chair/3-in-1   Stand to Sit 5: Supervision;To bed;To chair/3-in-1     Ambulation/Gait   Ambulation/Gait Yes   Ambulation/Gait Assistance 5: Supervision;4: Min guard   Ambulation Distance  (Feet) 100 Feet   Assistive device None   Gait Pattern Step-through pattern;Decreased stride length   Ambulation Surface Level;Indoor   Stairs --   [employer] --   Stair Management Technique --   Number of Stairs --   Height of Stairs --     Neuro Re-ed    Neuro Re-ed Details  See pictures from patient instructions listed below. Additoinally, at countertop, patient performed side step + reach (superior/lateral/inferior) to target. Patient requires demonstration, verbal cueing for technique, and tactile cueing at pelvis for weight shift.      Exercises   Exercises Other Exercises   Other Exercises  See pictures from patient instructions listed below       Using counter top for balance as needed:  Heel Raises    Stand with support. Raise heels a high as you can. Hold for 5 seconds. Try to raise hands off counter as able. Slowly lower heels back down.  Repeat _10__ times. Do _1-2_ times a day.  Copyright  VHI. All rights reserved.    "I love a Parade" Lift    High knee marching forward along counter top and then backwards at counter top to beginning point. Repeat for 3 laps each forward and backwards.  Do _1-2_ sessions per day.  http://gt2.exer.us/345   Copyright  VHI. All rights reserved.   Hip Side Kick    Hold as needed for balance: keep legs shoulder width apart and toes pointed forward. Swing a leg out to side, keeping knee straight. Do not lean. Repeat using other leg. Repeat __10__ times each leg. Do _1-2_ sessions per day.  http://gt2.exer.us/343   Copyright  VHI. All rights reserved.   Tandem Stance    Facing the counter and holding with both arms: Place right foot directly in front of left foot. Hold this position for 30 seconds. Then switch feet so that the left foot is directly in front of the right foot. Hold this for 30 seconds. Try to lift hands off counter as able to challenge balance.  Perform 3 reps with each foot forward. 1-2 times  a day. Copyright  VHI. All rights reserved.   Single Leg - Eyes Open    Holding support, lift right leg while maintaining balance over other leg. Progress to removing hands from support surface for longer periods of time. Hold__20__ seconds. Repeat with other leg. Repeat _3_ times standing on each leg. Do _1-2_ sessions per day.  Copyright  VHI. All rights reserved.   Perform these in corner with chair in front for safety: Feet Apart (Compliant Surface) Head Motion - Eyes Closed    Stand on compliant surface: _pillow/s_ with feet shoulder width apart. Close eyes and move head slowly: 1. up and down x 10 reps each way 2. Left and right x 10 reps each way 3.  Diagonals both ways x 10 reps each way Do _1-2_ sessions per day.  Copyright  VHI. All rights reserved.    Feet Together, Varied Arm Positions - Eyes Closed    Stand with feet together and arms as needed for balance. Close eyes and visualize upright position. Hold _30_ seconds. Repeat _3_ times per session. Do _1-2_ sessions per day.  Copyright  VHI. All rights reserved.              PT Education - 05/25/16 1506    Education provided Yes   Education Details Review and advancement of HEP    Person(s) Educated Patient;Spouse   Methods Explanation   Comprehension Verbalized understanding;Returned demonstration          PT Short Term Goals - 05/15/16 1458      PT SHORT TERM GOAL #1   Title (TARGET DATE FOR STG 04/27/16) Pt will perform strengthening and balance HEP under the supervision of husband   Baseline 05/15/16: met for current HEP which pt report is getting too easy- will plan to update at next apt   Status Achieved     PT SHORT TERM GOAL #2   Title Pt will participate in FGA assessment with LTG to be written   Baseline met on 04/24/16   Status Achieved     PT SHORT TERM GOAL #3   Title Pt will improve LE strength and balance with improvement in her ability to perform SLS on each leg >6  seconds   Baseline 05/15/16: can stand for up to 10 sec's with intermittent UE support for balance with each leg   Status Partially Met     PT SHORT TERM GOAL #4   Title Pt will ambulate up/down 8 stairs, no rails and over compliant surfaces x 200' with supervision and no episodes of LE foot drag/LOB   Baseline 05/15/16: pt is at supervision for up to 200 feet on indoor/outdoor surfaces, however pt needs at least 1 rail to ascend/descend stairs safely   Status Partially Met           PT Long Term Goals - 05/21/16 1655      PT LONG TERM GOAL #1   Title (REVISED TARGET DATE FOR ALL LTG 06/04/16) Pt will improve safety with gait as indicated by an increase in gait velocity to >3.5 ft/sec   Baseline 3.65 on 05/21/16   Status Achieved     PT LONG TERM GOAL #2   Title Pt will demonstrate decreased falls risk as indicated by FGA score of >22/30   Baseline 19/30 on 05/21/16   Status Revised     PT LONG TERM GOAL #4   Title Pt will negotiate 8 stairs alternating sequence with one rail Mod I and will ambulate >1,000' over indoor and uneven outdoor surfaces with supervision and no tripping or LOB   Status Revised     PT LONG TERM GOAL #5   Title Pt will improve LE strength and balance to be able to perform SLS x 5-7 seconds on each LE   Baseline 2-3 seconds on each LE on 05/21/16   Status Revised               Plan - 05/25/16 1511    Clinical Impression Statement Today's skilled PT session focused on reviewing and advancing the patient's HEP. PT discontinued, advanced, and added exercises to the patient's HEP. Patient and patient's husband returned demonstration for all exercises. Patient is making progress, and will benefit  from continued skilled PT to address functional mobility deficits.     Rehab Potential Good   Clinical Impairments Affecting Rehab Potential idiopathic polyneuropathy   PT Frequency 2x / week   PT Duration 8 weeks  2 more weeks for total of 8 weeks   PT  Treatment/Interventions ADLs/Self Care Home Management;DME Instruction;Gait training;Stair training;Functional mobility training;Therapeutic activities;Therapeutic exercise;Balance training;Neuromuscular re-education;Patient/family education;Orthotic Fit/Training;Vestibular   PT Next Visit Plan balance on complaint surfaces with vision removed and narrow BOS, dynamic balance activities (visual cues/targets are helpful!)    Consulted and Agree with Plan of Care Patient;Family member/caregiver   Family Member Consulted husband      Patient will benefit from skilled therapeutic intervention in order to improve the following deficits and impairments:  Decreased balance, Decreased cognition, Decreased strength, Difficulty walking, Dizziness, Impaired sensation  Visit Diagnosis: Repeated falls  Unsteadiness on feet  Muscle weakness (generalized)     Problem List Patient Active Problem List   Diagnosis Date Noted  . Preoperative testing   . Radius fracture 08/20/2015  . Leukocytosis 08/20/2015  . Insomnia 08/20/2015  . Overweight (BMI 25.0-29.9) 08/10/2015  . S/P right TKA 08/09/2015  . S/P knee replacement 08/09/2015  . S/p reverse total shoulder arthroplasty 11/11/2014  . Vertigo 09/10/2013  . Nausea alone 08/15/2012  . Duodenal ulcer 08/15/2012  . Personal history of colonic polyps 12/11/2011  . ASTHMATIC BRONCHITIS, ACUTE 03/31/2007  . ALLERGIC RHINITIS 03/31/2007  . Esophageal reflux 03/31/2007    Arelia Sneddon, SPT  05/25/2016, 3:33 PM  [facility] [address] [place], [place], [address] Phone: [phone]   Fax:  [phone]  Name: [name] MRN: [id number] Date of Birth: Apr 06, 1929

## 2016-05-25 NOTE — Patient Instructions (Addendum)
Using counter top for balance as needed:  Heel Raises    Stand with support. Raise heels a high as you can. Hold for 5 seconds. Try to raise hands off counter as able. Slowly lower heels back down.  Repeat _10__ times. Do _1-2_ times a day.  Copyright  VHI. All rights reserved.    "I love a Parade" Lift    High knee marching forward along counter top and then backwards at counter top to beginning point. Repeat for 3 laps each forward and backwards.  Do _1-2_ sessions per day.  http://gt2.exer.us/345   Copyright  VHI. All rights reserved.   Hip Side Kick    Hold as needed for balance: keep legs shoulder width apart and toes pointed forward. Swing a leg out to side, keeping knee straight. Do not lean. Repeat using other leg. Repeat __10__ times each leg. Do _1-2_ sessions per day.  http://gt2.exer.us/343   Copyright  VHI. All rights reserved.   Tandem Stance    Facing the counter and holding with both arms: Place right foot directly in front of left foot. Hold this position for 30 seconds. Then switch feet so that the left foot is directly in front of the right foot. Hold this for 30 seconds. Try to lift hands off counter as able to challenge balance.  Perform 3 reps with each foot forward. 1-2 times a day. Copyright  VHI. All rights reserved.   Single Leg - Eyes Open    Holding support, lift right leg while maintaining balance over other leg. Progress to removing hands from support surface for longer periods of time. Hold__20__ seconds. Repeat with other leg. Repeat _3_ times standing on each leg. Do _1-2_ sessions per day.  Copyright  VHI. All rights reserved.   Perform these in corner with chair in front for safety: Feet Apart (Compliant Surface) Head Motion - Eyes Closed    Stand on compliant surface: _pillow/s_ with feet shoulder width apart. Close eyes and move head slowly: 1. up and down x 10 reps each way 2. Left and right x 10 reps each way 3.  Diagonals both ways x 10 reps each way Do _1-2_ sessions per day.  Copyright  VHI. All rights reserved.    Feet Together, Varied Arm Positions - Eyes Closed    Stand with feet together and arms as needed for balance. Close eyes and visualize upright position. Hold _30_ seconds. Repeat _3_ times per session. Do _1-2_ sessions per day.  Copyright  VHI. All rights reserved.

## 2016-05-29 ENCOUNTER — Ambulatory Visit: Payer: Medicare HMO | Admitting: Physical Therapy

## 2016-05-29 ENCOUNTER — Encounter: Payer: Self-pay | Admitting: Physical Therapy

## 2016-05-29 DIAGNOSIS — R296 Repeated falls: Secondary | ICD-10-CM | POA: Diagnosis not present

## 2016-05-29 DIAGNOSIS — R208 Other disturbances of skin sensation: Secondary | ICD-10-CM

## 2016-05-29 DIAGNOSIS — R2681 Unsteadiness on feet: Secondary | ICD-10-CM

## 2016-05-29 DIAGNOSIS — M6281 Muscle weakness (generalized): Secondary | ICD-10-CM | POA: Diagnosis not present

## 2016-05-29 NOTE — Therapy (Signed)
[facility] [address] [place], [place], [address] Phone: [phone]   Fax:  [phone]  Physical Therapy Treatment  Patient Details  Name: [name] MRN: [id number] Date of Birth: 1929-09-18 Referring Provider: Penni Bombard, MD  Encounter Date: 05/29/2016      PT End of Session - 05/29/16 2036    Visit Number 10   Number of Visits 13   Date for PT Re-Evaluation 06/04/16   Authorization Type [employer]-G code and progress note every 10th visit   PT Start Time 1400   PT Stop Time 1446   PT Time Calculation (min) 46 min   Activity Tolerance Patient tolerated treatment well   Behavior During Therapy Impulsive;Anxious      Past Medical History:  Diagnosis Date  . Allergic rhinitis   . Anxiety   . Arthritis   . Breast cancer ([place])    left breast cancer  . Complication of anesthesia    memory loss  . Concussion 2015   fell and hit head  . Depression    situational depression  . Diverticulosis   . GERD (gastroesophageal reflux disease)   . Headache(784.0)    sinus HAs  . Hyperlipidemia   . Neuromuscular disorder (HCC)    neuropathy- nerve damage in neck causes nerve damage in toes    Past Surgical History:  Procedure Laterality Date  . ABDOMINAL HYSTERECTOMY    . CHOLECYSTECTOMY N/A 03/04/2015   Procedure: LAPAROSCOPIC CHOLECYSTECTOMY WITH INTRAOPERATIVE CHOLANGIOGRAM;  Surgeon: Greer Pickerel, MD;  Location: [facility];  Service: General;  Laterality: N/A;  . ESOPHAGOGASTRODUODENOSCOPY N/A 08/15/2012   Procedure: ESOPHAGOGASTRODUODENOSCOPY (EGD);  Surgeon: Inda Castle, MD;  Location: [facility];  Service: Endoscopy;  Laterality: N/A;  . MASTECTOMY     left  . NASAL SINUS SURGERY     2004  . ORIF WRIST FRACTURE Right 08/21/2015   Procedure: OPEN REDUCTION INTERNAL FIXATION (ORIF) WRIST FRACTURE;  Surgeon: Dayna Barker, MD;  Location: [facility];  Service: Plastics;  Laterality: Right;  .  REPLACEMENT TOTAL KNEE     left  . REVERSE SHOULDER ARTHROPLASTY Left 11/11/2014   Procedure: LEFT REVERSE SHOULDER ARTHROPLASTY;  Surgeon: Justice Britain, MD;  Location: [facility];  Service: Orthopedics;  Laterality: Left;  . SHOULDER SURGERY Bilateral    repairs, rot cuff repair  . STAPEDECTOMY Left    left ear  . TOTAL KNEE ARTHROPLASTY     right  . TOTAL KNEE ARTHROPLASTY Right 08/09/2015   Procedure: TOTAL KNEE ARTHROPLASTY;  Surgeon: Paralee Cancel, MD;  Location: [facility];  Service: Orthopedics;  Laterality: Right;    There were no vitals filed for this visit.      Subjective Assessment - 05/29/16 1400    Subjective Pt reports performing new exercises at home and when performing heel raises at home she felt an electric shock in her R foot causing her to scream out in pain.  Pt also reports that the past few nights when getting up to go to the bathroom she falls backwards when first standing up.  Pt very concerned about pain in foot.   Patient is accompained by: Family member   Pertinent History idiopathic polyneuropathy, repeated falls, dementia memory loss, recent R TKA, post concussive syndrome, breast CA   Limitations Walking   Patient Stated Goals To stop falling and improve balance   Currently in Pain? Yes   Pain Location Foot   Pain Orientation Right   Pain Descriptors /  Indicators Burning;Sharp   Pain Type Acute pain                         [facility] Adult PT Treatment/Exercise - 05/29/16 1700      Self-Care   Self-Care Other Self-Care Comments   Other Self-Care Comments  assessed pt's R foot on lateral aspect where she felt an electric shock pain during heel raises to determine if injury had occured to any joints, tendons or ligaments.  No areas were tender to palpation or passive movement.  Pt likely placed too much pressure on nerve when performing exercise.     Knee/Hip Exercises: Standing   Heel Raises Right;Left;10 reps;5 seconds   Heel Raises Limitations  with UE support   Hip Flexion Right;Left;10 reps;Knee bent  marching forwards and retro, UE support   Hip Flexion Limitations marching to focus on weight shifting, balance and single limb stance-verbal cues for safety    Hip Abduction Stengthening;Right;Left;10 reps;Knee straight   Abduction Limitations with UE support and verbal/visual cues for correct technique   Other Standing Knee Exercises Single leg toe raises with therapist blocking knee to isolate ankle DF x 10 reps each LE      Heel Raises    Stand with support. Raise heels a high as you can. Hold for 5 seconds. Try to raise hands off counter as able. Slowly lower heels back down.  Repeat _10__ times. Do _1-2_ times a day.  Copyright  VHI. All rights reserved.    "I love a Parade" Lift    High knee marching forward along counter top and then backwards at counter top to beginning point. Repeat for 3 laps each forward and backwards.  Do _1-2_ sessions per day.  http://gt2.exer.us/345   Copyright  VHI. All rights reserved.   Hip Side Kick    Hold as needed for balance: keep legs shoulder width apart and toes pointed forward. Swing a leg out to side, keeping knee straight. Do not lean. Repeat using other leg. Repeat __10__ times each leg. Do _1-2_ sessions per day.  http://gt2.exer.us/343   Copyright  VHI. All rights reserved.   Tandem Stance    Facing the counter and holding with both arms: Place right foot directly in front of left foot. Hold this position for 30 seconds. Then switch feet so that the left foot is directly in front of the right foot. Hold this for 30 seconds. Try to lift hands off counter as able to challenge balance.  Perform 3 reps with each foot forward. 1-2 times a day. Copyright  VHI. All rights reserved.   Single Leg - Eyes Open    Holding support, lift right leg while maintaining balance over other leg. Progress to removing hands from support surface for longer periods  of time. Hold__20__ seconds. Repeat with other leg. Repeat _3_ times standing on each leg. Do _1-2_ sessions per day.  Copyright  VHI. All rights reserved.   Perform these in corner with chair in front for safety: Feet Apart (Compliant Surface) Head Motion - Eyes Closed     Stand on compliant surface: _pillow/s_ with feet shoulder width apart. Close eyes and move head slowly: 1. up and down x 10 reps each way 2. Left and right x 10 reps each way 3. Diagonals both ways x 10 reps each way Do _1-2_ sessions per day.  Copyright  VHI. All rights reserved.    Feet Together, Varied Arm Positions - Eyes Closed  Stand with feet together and arms as needed for balance. Close eyes and visualize upright position. Hold _30_ seconds. Repeat _3_ times per session. Do _1-2_ sessions per day.        Balance Exercises - 05/29/16 2032      Balance Exercises: Standing   Standing Eyes Closed Narrow base of support (BOS);Wide (BOA);Head turns;Foam/compliant surface  feet apart with head turns, feet together no head turns   Tandem Stance Eyes open;Upper extremity support 2;1 rep;30 secs  verbal cues for use of UE for safety   SLS Eyes open;Solid surface;Upper extremity support 2;3 reps;20 secs           PT Education - 05/29/16 2036    Education provided Yes   Education Details review of HEP to discuss safety and technique   Person(s) Educated Patient;Spouse   Methods Explanation;Demonstration   Comprehension Verbalized understanding;Returned demonstration;Need further instruction          PT Short Term Goals - 05/15/16 1458      PT SHORT TERM GOAL #1   Title (TARGET DATE FOR STG 04/27/16) Pt will perform strengthening and balance HEP under the supervision of husband   Baseline 05/15/16: met for current HEP which pt report is getting too easy- will plan to update at next apt   Status Achieved     PT SHORT TERM GOAL #2   Title Pt will participate in FGA assessment with LTG  to be written   Baseline met on 04/24/16   Status Achieved     PT SHORT TERM GOAL #3   Title Pt will improve LE strength and balance with improvement in her ability to perform SLS on each leg >6 seconds   Baseline 05/15/16: can stand for up to 10 sec's with intermittent UE support for balance with each leg   Status Partially Met     PT SHORT TERM GOAL #4   Title Pt will ambulate up/down 8 stairs, no rails and over compliant surfaces x 200' with supervision and no episodes of LE foot drag/LOB   Baseline 05/15/16: pt is at supervision for up to 200 feet on indoor/outdoor surfaces, however pt needs at least 1 rail to ascend/descend stairs safely   Status Partially Met           PT Long Term Goals - 05/21/16 1655      PT LONG TERM GOAL #1   Title (REVISED TARGET DATE FOR ALL LTG 06/04/16) Pt will improve safety with gait as indicated by an increase in gait velocity to >3.5 ft/sec   Baseline 3.65 on 05/21/16   Status Achieved     PT LONG TERM GOAL #2   Title Pt will demonstrate decreased falls risk as indicated by FGA score of >22/30   Baseline 19/30 on 05/21/16   Status Revised     PT LONG TERM GOAL #4   Title Pt will negotiate 8 stairs alternating sequence with one rail Mod I and will ambulate >1,000' over indoor and uneven outdoor surfaces with supervision and no tripping or LOB   Status Revised     PT LONG TERM GOAL #5   Title Pt will improve LE strength and balance to be able to perform SLS x 5-7 seconds on each LE   Baseline 2-3 seconds on each LE on 05/21/16   Status Revised               Plan - 05/29/16 2037    Clinical Impression Statement Continued review of  advanced HEP today due to patient with concerns about foot pain and questions about use of hands vs. no hands.  Observed pt performing each exercise and provided pt with verbal, visual and tactile cues for correct technique and sequence for maximum safety and benefit of exercise.  Discussed safe progression of removal  of UE support to progress balance challenge (pt tends to remove UE all at once causing significant LOB and becomes frustrated/anxious).  Will continue to review and progress as able.   Rehab Potential Good   Clinical Impairments Affecting Rehab Potential idiopathic polyneuropathy   PT Treatment/Interventions ADLs/Self Care Home Management;DME Instruction;Gait training;Stair training;Functional mobility training;Therapeutic activities;Therapeutic exercise;Balance training;Neuromuscular re-education;Patient/family education;Orthotic Fit/Training;Vestibular   PT Next Visit Plan continue to review HEP adding in compliant surfaces (foam or rocker board for heel raises, toe raises, SLS, marching, hip ABD); progress corner balance exercises; gait outside over compliant surfaces   Consulted and Agree with Plan of Care Patient;Family member/caregiver   Family Member Consulted husband      Patient will benefit from skilled therapeutic intervention in order to improve the following deficits and impairments:  Decreased balance, Decreased cognition, Decreased strength, Difficulty walking, Dizziness, Impaired sensation  Visit Diagnosis: Repeated falls  Unsteadiness on feet  Muscle weakness (generalized)  Other disturbances of skin sensation       G-Codes - 06-26-16 1400    Functional Assessment Tool Used (Outpatient Only) gait velocity, clincial judgment, FGA   Functional Limitation Mobility: Walking and moving around   Mobility: Walking and Moving Around Current Status [phone]) At least 20 percent but less than 40 percent impaired, limited or restricted   Mobility: Walking and Moving Around Goal Status [phone]) At least 1 percent but less than 20 percent impaired, limited or restricted      Problem List Patient Active Problem List   Diagnosis Date Noted  . Preoperative testing   . Radius fracture 08/20/2015  . Leukocytosis 08/20/2015  . Insomnia 08/20/2015  . Overweight (BMI 25.0-29.9)  08/10/2015  . S/P right TKA 08/09/2015  . S/P knee replacement 08/09/2015  . S/p reverse total shoulder arthroplasty 11/11/2014  . Vertigo 09/10/2013  . Nausea alone 08/15/2012  . Duodenal ulcer 08/15/2012  . Personal history of colonic polyps 12/11/2011  . ASTHMATIC BRONCHITIS, ACUTE 03/31/2007  . ALLERGIC RHINITIS 03/31/2007  . Esophageal reflux 03/31/2007   Physical Therapy Progress Note  Dates of Reporting Period: 04/05/16 to 06/26/2016  Objective Reports of Subjective Statement: See above  Objective Measurements: FGA, gait velocity  Goal Update: See goals above  Plan: see above  Reason Skilled Services are Required: To continue to address impaired balance, gait and LE strength to minimize falls risk.  Raylene Everts, PT, DPT 06-26-16    8:43 PM    [facility] [address] [place], [place], [address] Phone: [phone]   Fax:  [phone]  Name: [name] MRN: [id number] Date of Birth: [birth year]

## 2016-05-30 ENCOUNTER — Ambulatory Visit: Payer: Medicare HMO | Admitting: Physical Therapy

## 2016-05-30 ENCOUNTER — Encounter: Payer: Self-pay | Admitting: Physical Therapy

## 2016-05-30 DIAGNOSIS — R296 Repeated falls: Secondary | ICD-10-CM

## 2016-05-30 DIAGNOSIS — M6281 Muscle weakness (generalized): Secondary | ICD-10-CM | POA: Diagnosis not present

## 2016-05-30 DIAGNOSIS — R2681 Unsteadiness on feet: Secondary | ICD-10-CM

## 2016-05-30 DIAGNOSIS — R208 Other disturbances of skin sensation: Secondary | ICD-10-CM | POA: Diagnosis not present

## 2016-06-01 ENCOUNTER — Ambulatory Visit: Payer: Medicare HMO | Admitting: Physical Therapy

## 2016-06-01 NOTE — Therapy (Signed)
[facility] [address] [place], [place], [address] Phone: [phone]   Fax:  [phone]  Physical Therapy Treatment  Patient Details  Name: [name] MRN: [id number] Date of Birth: 1929/12/24 Referring Provider: Penni Bombard, MD  Encounter Date: 05/30/2016   05/30/16 1542  PT Visits / Re-Eval  Visit Number 11  Number of Visits 13  Date for PT Re-Evaluation 06/04/16  Authorization  Authorization Type [employer]-G code and progress note every 10th visit  PT Time Calculation  PT Start Time 1535  PT Stop Time 1615  PT Time Calculation (min) 40 min  PT - End of Session  Activity Tolerance Patient tolerated treatment well  Behavior During Therapy Impulsive;Anxious      Past Medical History:  Diagnosis Date  . Allergic rhinitis   . Anxiety   . Arthritis   . Breast cancer ([facility])    left breast cancer  . Complication of anesthesia    memory loss  . Concussion 2015   fell and hit head  . Depression    situational depression  . Diverticulosis   . GERD (gastroesophageal reflux disease)   . Headache(784.0)    sinus HAs  . Hyperlipidemia   . Neuromuscular disorder (HCC)    neuropathy- nerve damage in neck causes nerve damage in toes    Past Surgical History:  Procedure Laterality Date  . ABDOMINAL HYSTERECTOMY    . CHOLECYSTECTOMY N/A 03/04/2015   Procedure: LAPAROSCOPIC CHOLECYSTECTOMY WITH INTRAOPERATIVE CHOLANGIOGRAM;  Surgeon: Greer Pickerel, MD;  Location: [facility];  Service: General;  Laterality: N/A;  . ESOPHAGOGASTRODUODENOSCOPY N/A 08/15/2012   Procedure: ESOPHAGOGASTRODUODENOSCOPY (EGD);  Surgeon: Inda Castle, MD;  Location: [facility];  Service: Endoscopy;  Laterality: N/A;  . MASTECTOMY     left  . NASAL SINUS SURGERY     2004  . ORIF WRIST FRACTURE Right 08/21/2015   Procedure: OPEN REDUCTION INTERNAL FIXATION (ORIF) WRIST FRACTURE;  Surgeon: Dayna Barker, MD;  Location: [facility];   Service: Plastics;  Laterality: Right;  . REPLACEMENT TOTAL KNEE     left  . REVERSE SHOULDER ARTHROPLASTY Left 11/11/2014   Procedure: LEFT REVERSE SHOULDER ARTHROPLASTY;  Surgeon: Justice Britain, MD;  Location: [facility];  Service: Orthopedics;  Laterality: Left;  . SHOULDER SURGERY Bilateral    repairs, rot cuff repair  . STAPEDECTOMY Left    left ear  . TOTAL KNEE ARTHROPLASTY     right  . TOTAL KNEE ARTHROPLASTY Right 08/09/2015   Procedure: TOTAL KNEE ARTHROPLASTY;  Surgeon: Paralee Cancel, MD;  Location: [facility];  Service: Orthopedics;  Laterality: Right;    There were no vitals filed for this visit.     05/30/16 1539  Symptoms/Limitations  Subjective Pt reports having appt tomorrow at [facility] to have right orthotic checked due to not fitting correctly anymore. Has a headache today.   Patient is accompained by: Family member  Pertinent History idiopathic polyneuropathy, repeated falls, dementia memory loss, recent R TKA, post concussive syndrome, breast CA  Limitations Walking  Patient Stated Goals To stop falling and improve balance  Pain Assessment  Currently in Pain? Yes  Pain Score 2  Pain Location Head  Pain Descriptors / Indicators Headache  Pain Type Acute pain  Pain Onset Today  Pain Frequency Intermittent  Aggravating Factors  rainy weather, h/o headache  Pain Relieving Factors lyrica, tylenol as needed          Balance Exercises - 05/30/16 1016  Balance Exercises: Standing   Rockerboard Anterior/posterior;Lateral;Head turns;EO;EC;20 seconds;10 reps;Intermittent UE support     Balance Exercises: Standing   Rebounder Limitations performed both ways on balance board: EO rocking board with emphasis on weight shifting and maintaining tall posture; EO holding board steady: alternating UE raises x 10 reps each side, progressing to bil UE raises (within pt's painfree ranges due to right shoulder issues) x 10 reps; holding board steady with EC: no head movements  progressing towards head movements left<>right, up<>down and diagonals both ways. min guard to min assist for balance along with cues on stance position and weight shifting for improved balance.                          05/30/16 1811  PT Education  Education provided Yes  Education Details reinforced doing HEP at home and advised pt/spouse to bring any issues they have with them to next appt  Person(s) Educated Patient;Spouse  Methods Explanation;Demonstration  Comprehension Verbalized understanding;Returned demonstration         PT Short Term Goals - 05/15/16 1458      PT SHORT TERM GOAL #1   Title (TARGET DATE FOR STG 04/27/16) Pt will perform strengthening and balance HEP under the supervision of husband   Baseline 05/15/16: met for current HEP which pt report is getting too easy- will plan to update at next apt   Status Achieved     PT SHORT TERM GOAL #2   Title Pt will participate in FGA assessment with LTG to be written   Baseline met on 04/24/16   Status Achieved     PT SHORT TERM GOAL #3   Title Pt will improve LE strength and balance with improvement in her ability to perform SLS on each leg >6 seconds   Baseline 05/15/16: can stand for up to 10 sec's with intermittent UE support for balance with each leg   Status Partially Met     PT SHORT TERM GOAL #4   Title Pt will ambulate up/down 8 stairs, no rails and over compliant surfaces x 200' with supervision and no episodes of LE foot drag/LOB   Baseline 05/15/16: pt is at supervision for up to 200 feet on indoor/outdoor surfaces, however pt needs at least 1 rail to ascend/descend stairs safely   Status Partially Met           PT Long Term Goals - 05/21/16 1655      PT LONG TERM GOAL #1   Title (REVISED TARGET DATE FOR ALL LTG 06/04/16) Pt will improve safety with gait as indicated by an increase in gait velocity to >3.5 ft/sec   Baseline 3.65 on 05/21/16   Status Achieved     PT LONG TERM GOAL #2   Title Pt will  demonstrate decreased falls risk as indicated by FGA score of >22/30   Baseline 19/30 on 05/21/16   Status Revised     PT LONG TERM GOAL #4   Title Pt will negotiate 8 stairs alternating sequence with one rail Mod I and will ambulate >1,000' over indoor and uneven outdoor surfaces with supervision and no tripping or LOB   Status Revised     PT LONG TERM GOAL #5   Title Pt will improve LE strength and balance to be able to perform SLS x 5-7 seconds on each LE   Baseline 2-3 seconds on each LE on 05/21/16   Status Revised  05/30/16 1544  Plan  Clinical Impression Statement Today's skilled session initially readdressed HEP. Pt wanting to know if we were going to do the same ones in session today. Advised pt/spouse the plan was to work on other balance activities in session's with the expectation that she is doing the HEP at home. Advised them to try the exercises over the weekend after the review they had last session and to write down any questions they may have for next session for a final review. Remainder of session focused on high level balance without any issues reported. Pt did fatigue quickly and needed rest breaks. Pt is progressing toward goals and should benefit from continued PT to progress toward unmet goals.                               Pt will benefit from skilled therapeutic intervention in order to improve on the following deficits Decreased balance;Decreased cognition;Decreased strength;Difficulty walking;Dizziness;Impaired sensation  Rehab Potential Good  Clinical Impairments Affecting Rehab Potential idiopathic polyneuropathy  PT Treatment/Interventions ADLs/Self Care Home Management;DME Instruction;Gait training;Stair training;Functional mobility training;Therapeutic activities;Therapeutic exercise;Balance training;Neuromuscular re-education;Patient/family education;Orthotic Fit/Training;Vestibular  PT Next Visit Plan review HEP if pt/spouse have questions;begin to assess  LTGs for anticipated discharge next week (end of plan of care)  Consulted and Agree with Plan of Care Patient;Family member/caregiver  Family Member Consulted husband     Patient will benefit from skilled therapeutic intervention in order to improve the following deficits and impairments:  Decreased balance, Decreased cognition, Decreased strength, Difficulty walking, Dizziness, Impaired sensation  Visit Diagnosis: Repeated falls  Unsteadiness on feet  Muscle weakness (generalized)  Other disturbances of skin sensation     Problem List Patient Active Problem List   Diagnosis Date Noted  . Preoperative testing   . Radius fracture 08/20/2015  . Leukocytosis 08/20/2015  . Insomnia 08/20/2015  . Overweight (BMI 25.0-29.9) 08/10/2015  . S/P right TKA 08/09/2015  . S/P knee replacement 08/09/2015  . S/p reverse total shoulder arthroplasty 11/11/2014  . Vertigo 09/10/2013  . Nausea alone 08/15/2012  . Duodenal ulcer 08/15/2012  . Personal history of colonic polyps 12/11/2011  . ASTHMATIC BRONCHITIS, ACUTE 03/31/2007  . ALLERGIC RHINITIS 03/31/2007  . Esophageal reflux 03/31/2007    Willow Ora, PTA, [facility] [address], [place] [employer], [place] [address] [phone] 06/01/16, 10:23 AM   Name: MELVENA VINK MRN: [id number] Date of Birth: 1929-04-19

## 2016-06-04 ENCOUNTER — Ambulatory Visit: Payer: Medicare HMO | Admitting: Physical Therapy

## 2016-06-04 ENCOUNTER — Encounter: Payer: Self-pay | Admitting: Physical Therapy

## 2016-06-04 DIAGNOSIS — R296 Repeated falls: Secondary | ICD-10-CM

## 2016-06-04 DIAGNOSIS — R2681 Unsteadiness on feet: Secondary | ICD-10-CM | POA: Diagnosis not present

## 2016-06-04 DIAGNOSIS — M6281 Muscle weakness (generalized): Secondary | ICD-10-CM | POA: Diagnosis not present

## 2016-06-04 DIAGNOSIS — R208 Other disturbances of skin sensation: Secondary | ICD-10-CM

## 2016-06-04 NOTE — Therapy (Signed)
[facility] [address] [place], [place], [address] Phone: [phone]   Fax:  [phone]  Physical Therapy Treatment and D/C Summary  Patient Details  Name: [name] MRN: [id number] Date of Birth: August 12, 1929 Referring Provider: Penni Bombard, MD  Encounter Date: 06/04/2016      PT End of Session - 06/04/16 2224    Visit Number 12   Number of Visits 13   Date for PT Re-Evaluation 06/04/16   Authorization Type [employer]-G code and progress note every 10th visit   PT Start Time 1319   PT Stop Time 1407   PT Time Calculation (min) 48 min   Activity Tolerance Patient tolerated treatment well   Behavior During Therapy [facility] for tasks assessed/performed      Past Medical History:  Diagnosis Date  . Allergic rhinitis   . Anxiety   . Arthritis   . Breast cancer ([place])    left breast cancer  . Complication of anesthesia    memory loss  . Concussion 2015   fell and hit head  . Depression    situational depression  . Diverticulosis   . GERD (gastroesophageal reflux disease)   . Headache(784.0)    sinus HAs  . Hyperlipidemia   . Neuromuscular disorder (HCC)    neuropathy- nerve damage in neck causes nerve damage in toes    Past Surgical History:  Procedure Laterality Date  . ABDOMINAL HYSTERECTOMY    . CHOLECYSTECTOMY N/A 03/04/2015   Procedure: LAPAROSCOPIC CHOLECYSTECTOMY WITH INTRAOPERATIVE CHOLANGIOGRAM;  Surgeon: Greer Pickerel, MD;  Location: [facility];  Service: General;  Laterality: N/A;  . ESOPHAGOGASTRODUODENOSCOPY N/A 08/15/2012   Procedure: ESOPHAGOGASTRODUODENOSCOPY (EGD);  Surgeon: Inda Castle, MD;  Location: [facility];  Service: Endoscopy;  Laterality: N/A;  . MASTECTOMY     left  . NASAL SINUS SURGERY     2004  . ORIF WRIST FRACTURE Right 08/21/2015   Procedure: OPEN REDUCTION INTERNAL FIXATION (ORIF) WRIST FRACTURE;  Surgeon: Dayna Barker, MD;  Location: [facility];  Service:  Plastics;  Laterality: Right;  . REPLACEMENT TOTAL KNEE     left  . REVERSE SHOULDER ARTHROPLASTY Left 11/11/2014   Procedure: LEFT REVERSE SHOULDER ARTHROPLASTY;  Surgeon: Justice Britain, MD;  Location: [facility];  Service: Orthopedics;  Laterality: Left;  . SHOULDER SURGERY Bilateral    repairs, rot cuff repair  . STAPEDECTOMY Left    left ear  . TOTAL KNEE ARTHROPLASTY     right  . TOTAL KNEE ARTHROPLASTY Right 08/09/2015   Procedure: TOTAL KNEE ARTHROPLASTY;  Surgeon: Paralee Cancel, MD;  Location: [facility];  Service: Orthopedics;  Laterality: Right;    There were no vitals filed for this visit.      Subjective Assessment - 06/04/16 1323    Subjective Pt had orthotic checked by [employer] and had it adjusted slightly (shaved down at arch).  Pt reports orthotic feeling better.  Discussed with pt and husband today is end of recertification, will recheck LTG today and make decision regarding D/C vs. recertification.   Patient is accompained by: Family member   Pertinent History idiopathic polyneuropathy, repeated falls, dementia memory loss, recent R TKA, post concussive syndrome, breast CA   Limitations Walking   Patient Stated Goals To stop falling and improve balance   Currently in Pain? No/denies            [facility] PT Assessment - 06/04/16 1325      Ambulation/Gait   Ambulation/Gait Yes  Ambulation/Gait Assistance 6: Modified independent (Device/Increase time);5: Supervision   Ambulation/Gait Assistance Details Pt performs gait indoors over level surfaces Mod I but continues to require supervision for gait outside over uneven pavement and grass with head turns and cognitive dual tasks due to decreased foot clearance and 2-3 LOB on sloping pavement   Ambulation Distance (Feet) 1000 Feet   Assistive device None   Gait Pattern Step-through pattern;Decreased step length - right;Decreased step length - left;Decreased stride length;Decreased trunk rotation;Poor foot clearance - left;Poor foot  clearance - right   Ambulation Surface Level;Unlevel;Indoor;Outdoor;Paved;Grass   Stairs Yes   Stairs Assistance 5: Supervision   Stairs Assistance Details (indicate cue type and reason) Required supervision to ascend and descend with alternating sequence and one rail due to hip instability   Stair Management Technique One rail Right;Alternating pattern;Forwards   Number of Stairs 8   Height of Stairs 6   Curb 5: Supervision   Curb Details (indicate cue type and reason) without UE support outside     Standardized Balance Assessment   Standardized Balance Assessment 10 meter walk test   10 Meter Walk 9.44 sec or 3.47 ft/sec     High Level Balance   High Level Balance Comments SLS: LLE: 3-4 seconds, RLE: 4 seconds     Functional Gait  Assessment   Gait assessed  Yes   Gait Level Surface Walks 20 ft in less than 7 sec but greater than 5.5 sec, uses assistive device, slower speed, mild gait deviations, or deviates 6-10 in outside of the 12 in walkway width.   Change in Gait Speed Able to change speed, demonstrates mild gait deviations, deviates 6-10 in outside of the 12 in walkway width, or no gait deviations, unable to achieve a major change in velocity, or uses a change in velocity, or uses an assistive device.   Gait with Horizontal Head Turns Performs head turns smoothly with slight change in gait velocity (eg, minor disruption to smooth gait path), deviates 6-10 in outside 12 in walkway width, or uses an assistive device.   Gait with Vertical Head Turns Performs head turns with no change in gait. Deviates no more than 6 in outside 12 in walkway width.   Gait and Pivot Turn Pivot turns safely within 3 sec and stops quickly with no loss of balance.   Step Over Obstacle Is able to step over one shoe box (4.5 in total height) without changing gait speed. No evidence of imbalance.   Gait with Narrow Base of Support Ambulates less than 4 steps heel to toe or cannot perform without assistance.    Gait with Eyes Closed Walks 20 ft, uses assistive device, slower speed, mild gait deviations, deviates 6-10 in outside 12 in walkway width. Ambulates 20 ft in less than 9 sec but greater than 7 sec.   Ambulating Backwards Walks 20 ft, no assistive devices, good speed, no evidence for imbalance, normal gait   Steps Alternating feet, must use rail.   Total Score 21   FGA comment: 21/30                               PT Short Term Goals - 05/15/16 1458      PT SHORT TERM GOAL #1   Title (TARGET DATE FOR STG 04/27/16) Pt will perform strengthening and balance HEP under the supervision of husband   Baseline 05/15/16: met for current HEP which pt report is getting  too easy- will plan to update at next apt   Status Achieved     PT SHORT TERM GOAL #2   Title Pt will participate in FGA assessment with LTG to be written   Baseline met on 04/24/16   Status Achieved     PT SHORT TERM GOAL #3   Title Pt will improve LE strength and balance with improvement in her ability to perform SLS on each leg >6 seconds   Baseline 05/15/16: can stand for up to 10 sec's with intermittent UE support for balance with each leg   Status Partially Met     PT SHORT TERM GOAL #4   Title Pt will ambulate up/down 8 stairs, no rails and over compliant surfaces x 200' with supervision and no episodes of LE foot drag/LOB   Baseline 05/15/16: pt is at supervision for up to 200 feet on indoor/outdoor surfaces, however pt needs at least 1 rail to ascend/descend stairs safely   Status Partially Met    Tr       PT Long Term Goals - 06/04/16 1349      PT LONG TERM GOAL #1   Title (REVISED TARGET DATE FOR ALL LTG 06/04/16) Pt will improve safety with gait as indicated by an increase in gait velocity to >3.5 ft/sec   Baseline 3.65 on 05/21/16   Status Achieved     PT LONG TERM GOAL #2   Title Pt will demonstrate decreased falls risk as indicated by FGA score of >22/30   Baseline 21/30 partially met    Status Partially Met     PT LONG TERM GOAL #4   Title Pt will negotiate 8 stairs alternating sequence with one rail Mod I and will ambulate >1,000' over indoor and uneven outdoor surfaces with supervision and no tripping or LOB   Baseline requires supervision due to hip instability; supervision for gait outside >1,000 with supervision with 1-2 foot catching/LOB   Status Partially Met     PT LONG TERM GOAL #5   Title Pt will improve LE strength and balance to be able to perform SLS x 5-7 seconds on each LE   Baseline 3-4 seconds each LE   Status Partially Met               Plan - 06/04/16 2224    Clinical Impression Statement Treatment session today focused on re-assessment of LTG and to discuss plan for D/C vs. recertification.  Pt met 1/4 LTG and partially met 3/4.  Pt did demonstrate improvements in balance during gait as indicated by improvements in gait velocity and improvement in FGA score.  Pt continues to have difficulty maintaining balance with decreased visual input, smaller BOS and compliant surfaces due to continued weakness and impaired single limb stance.  Pt and husband to spend the summer at [facility] where they will continue exercises at wellness center and will f/u with neurologist at end of summer for re-assessment and possible referral back to therapy if needed.  Pt to D/C from therapy at this time.   Rehab Potential Good   Clinical Impairments Affecting Rehab Potential idiopathic polyneuropathy   PT Treatment/Interventions ADLs/Self Care Home Management;DME Instruction;Gait training;Stair training;Functional mobility training;Therapeutic activities;Therapeutic exercise;Balance training;Neuromuscular re-education;Patient/family education;Orthotic Fit/Training;Vestibular   PT Next Visit Plan D/C today   Consulted and Agree with Plan of Care Patient;Family member/caregiver   Family Member Consulted husband      Patient will benefit from skilled therapeutic  intervention in order to improve the following  deficits and impairments:  Decreased balance, Decreased cognition, Decreased strength, Difficulty walking, Dizziness, Impaired sensation  Visit Diagnosis: Repeated falls  Unsteadiness on feet  Muscle weakness (generalized)  Other disturbances of skin sensation       G-Codes - Jun 27, 2016 2231    Functional Assessment Tool Used (Outpatient Only) gait velocity, clincial judgment, FGA   Functional Limitation Mobility: Walking and moving around   Mobility: Walking and Moving Around Goal Status [phone]) At least 1 percent but less than 20 percent impaired, limited or restricted   Mobility: Walking and Moving Around Discharge Status [phone]) At least 20 percent but less than 40 percent impaired, limited or restricted      Problem List Patient Active Problem List   Diagnosis Date Noted  . Preoperative testing   . Radius fracture 08/20/2015  . Leukocytosis 08/20/2015  . Insomnia 08/20/2015  . Overweight (BMI 25.0-29.9) 08/10/2015  . S/P right TKA 08/09/2015  . S/P knee replacement 08/09/2015  . S/p reverse total shoulder arthroplasty 11/11/2014  . Vertigo 09/10/2013  . Nausea alone 08/15/2012  . Duodenal ulcer 08/15/2012  . Personal history of colonic polyps 12/11/2011  . ASTHMATIC BRONCHITIS, ACUTE 03/31/2007  . ALLERGIC RHINITIS 03/31/2007  . Esophageal reflux 03/31/2007   PHYSICAL THERAPY DISCHARGE SUMMARY  Visits from Start of Care: 12  Current functional level related to goals / functional outcomes: See goals and impression statement above   Remaining deficits: Pain, impaired LE strength, impaired balance, gait and endurance   Education / Equipment: HEP  Plan: Patient agrees to discharge.  Patient goals were partially met. Patient is being discharged due to being pleased with the current functional level.  ?????     Raylene Everts, PT, DPT 06/27/16    10:34 PM    [facility] [address] [place], [place], [address] Phone: [phone]   Fax:  [phone]  Name: [name] MRN: [id number] Date of Birth: [birth year]

## 2016-06-05 ENCOUNTER — Ambulatory Visit: Payer: Medicare HMO | Admitting: Physical Therapy

## 2016-06-05 DIAGNOSIS — M25511 Pain in right shoulder: Secondary | ICD-10-CM | POA: Diagnosis not present

## 2016-06-05 DIAGNOSIS — M12811 Other specific arthropathies, not elsewhere classified, right shoulder: Secondary | ICD-10-CM | POA: Diagnosis not present

## 2016-06-05 DIAGNOSIS — G8929 Other chronic pain: Secondary | ICD-10-CM | POA: Diagnosis not present

## 2016-06-05 DIAGNOSIS — Z96612 Presence of left artificial shoulder joint: Secondary | ICD-10-CM | POA: Diagnosis not present

## 2016-06-07 ENCOUNTER — Ambulatory Visit: Payer: Medicare HMO | Admitting: Physical Therapy

## 2016-06-08 ENCOUNTER — Ambulatory Visit: Payer: Medicare HMO | Admitting: Physical Therapy

## 2016-06-10 DIAGNOSIS — J069 Acute upper respiratory infection, unspecified: Secondary | ICD-10-CM | POA: Diagnosis not present

## 2016-06-10 DIAGNOSIS — J209 Acute bronchitis, unspecified: Secondary | ICD-10-CM | POA: Diagnosis not present

## 2016-07-10 ENCOUNTER — Ambulatory Visit: Payer: Commercial Managed Care - HMO | Admitting: Diagnostic Neuroimaging

## 2016-08-30 DIAGNOSIS — Z6828 Body mass index (BMI) 28.0-28.9, adult: Secondary | ICD-10-CM | POA: Diagnosis not present

## 2016-08-30 DIAGNOSIS — M1990 Unspecified osteoarthritis, unspecified site: Secondary | ICD-10-CM | POA: Diagnosis not present

## 2016-08-30 DIAGNOSIS — R413 Other amnesia: Secondary | ICD-10-CM | POA: Diagnosis not present

## 2016-08-30 DIAGNOSIS — R2689 Other abnormalities of gait and mobility: Secondary | ICD-10-CM | POA: Diagnosis not present

## 2016-08-30 DIAGNOSIS — M810 Age-related osteoporosis without current pathological fracture: Secondary | ICD-10-CM | POA: Diagnosis not present

## 2016-08-30 DIAGNOSIS — R51 Headache: Secondary | ICD-10-CM | POA: Diagnosis not present

## 2016-09-05 DIAGNOSIS — Z6828 Body mass index (BMI) 28.0-28.9, adult: Secondary | ICD-10-CM | POA: Diagnosis not present

## 2016-09-05 DIAGNOSIS — Z111 Encounter for screening for respiratory tuberculosis: Secondary | ICD-10-CM | POA: Diagnosis not present

## 2016-09-05 DIAGNOSIS — Z0289 Encounter for other administrative examinations: Secondary | ICD-10-CM | POA: Diagnosis not present

## 2016-09-26 ENCOUNTER — Telehealth: Payer: Self-pay | Admitting: Internal Medicine

## 2016-09-26 DIAGNOSIS — Z6829 Body mass index (BMI) 29.0-29.9, adult: Secondary | ICD-10-CM | POA: Diagnosis not present

## 2016-09-26 DIAGNOSIS — K5900 Constipation, unspecified: Secondary | ICD-10-CM | POA: Diagnosis not present

## 2016-09-26 DIAGNOSIS — R109 Unspecified abdominal pain: Secondary | ICD-10-CM | POA: Diagnosis not present

## 2016-09-26 NOTE — Telephone Encounter (Signed)
Pts husband called and states they were able to see their PCP today and feel they are ok now.

## 2016-09-26 NOTE — Telephone Encounter (Signed)
Left message to call back  

## 2016-10-09 IMAGING — CT CT HEAD W/O CM
1 series · 16 of 30 positions shown, 20 images · non-contrast
Comparison: 09/10/2013

CLINICAL DATA: Sudden onset of weakness and dizziness. Slipped and
fall, striking head on the front left side.

EXAM:
CT HEAD WITHOUT CONTRAST
TECHNIQUE: Contiguous axial images were obtained from the base of the skull
through the vertex without intravenous contrast.

[Series 2: head 5.0 h30s · axial · 0.43mm/px · z∈[-137,-2]mm · 16 of 31 slices shown, 20 images]
[im 2/31  brain]
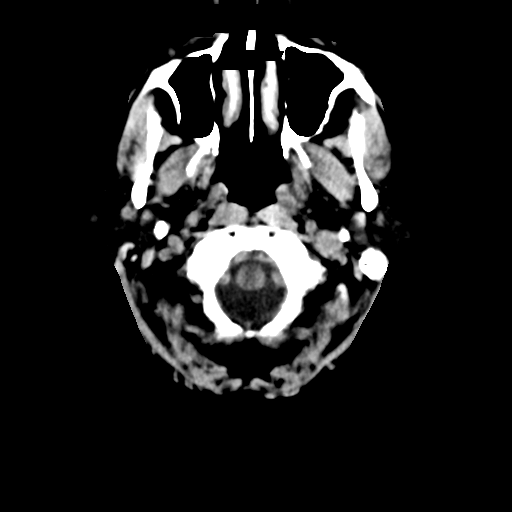
[im 2/31  bone]
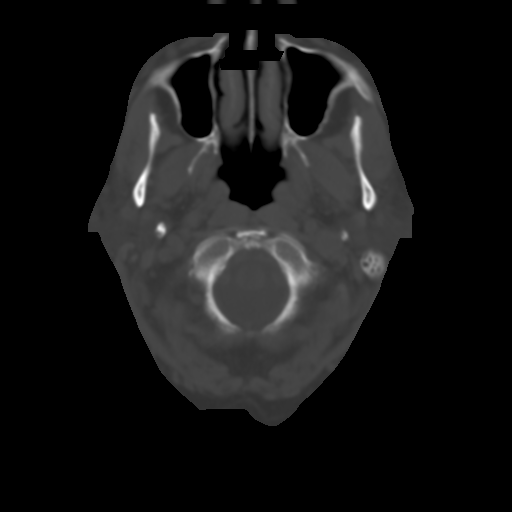
[im 4/31  brain]
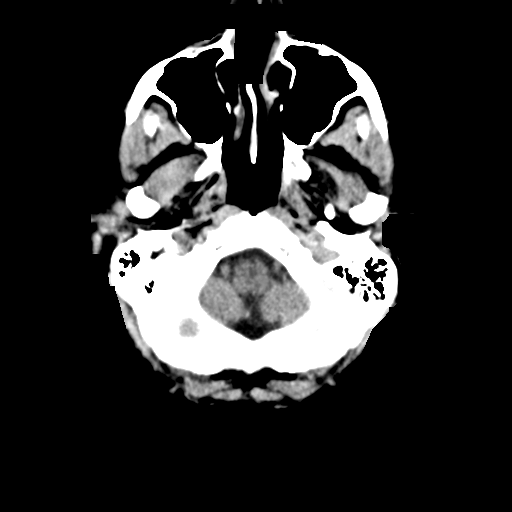
[im 6/31  brain]
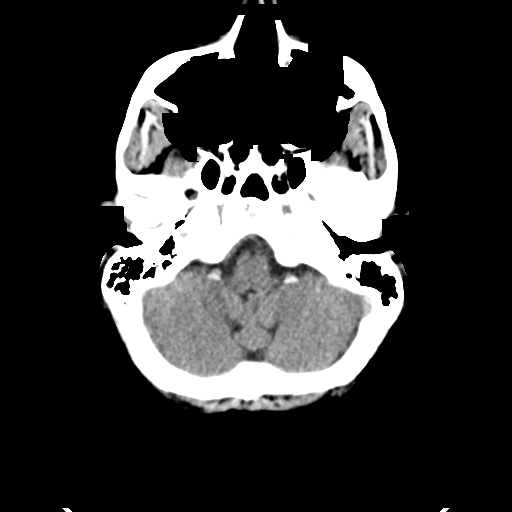
[im 8/31  brain]
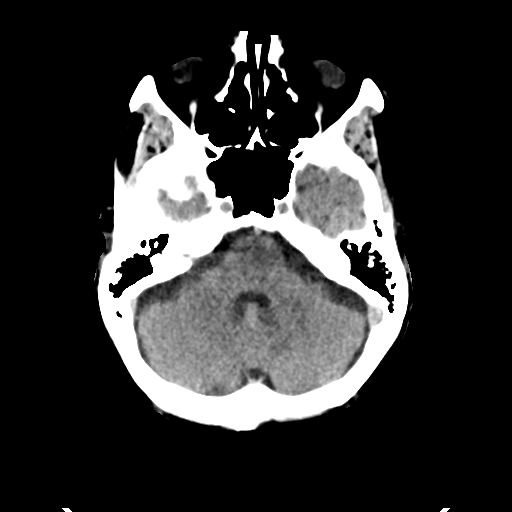
[im 9/31  brain]
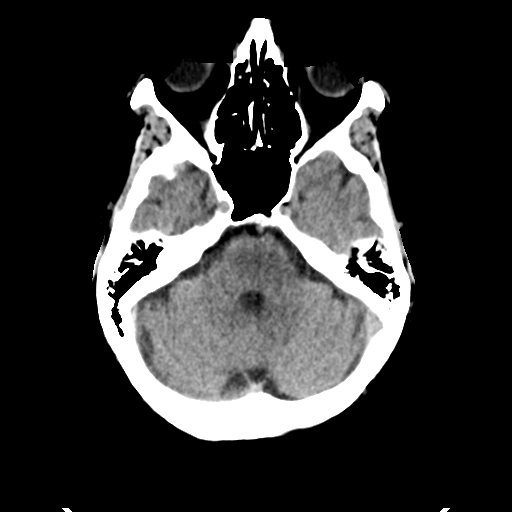
[im 9/31  bone]
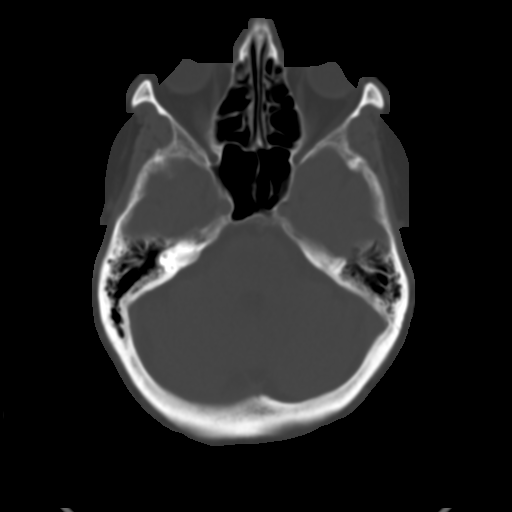
[im 11/31  brain]
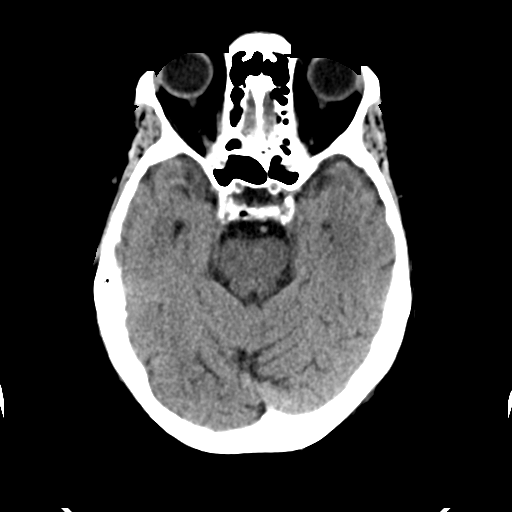
[im 13/31  brain]
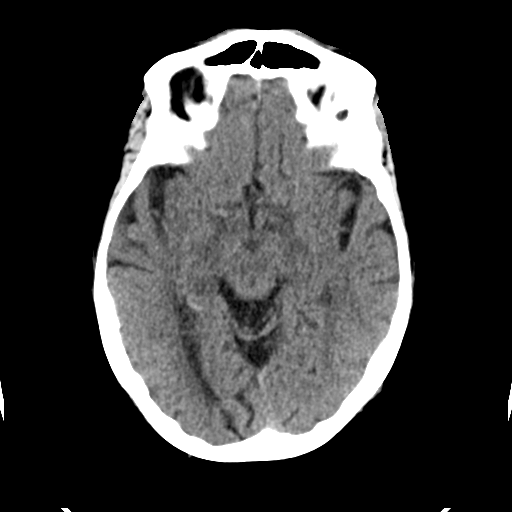
[im 15/31  brain]
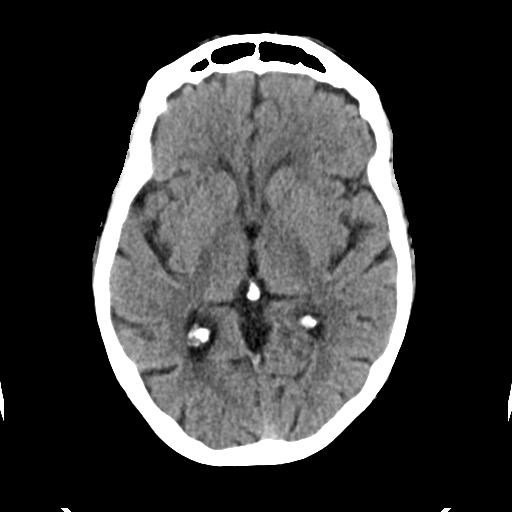
[im 16/31  brain]
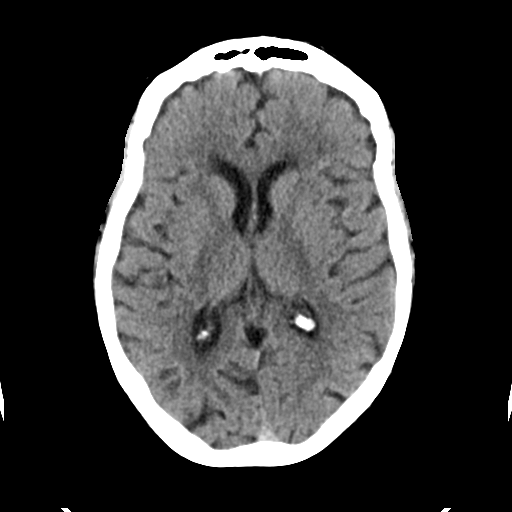
[im 16/31  bone]
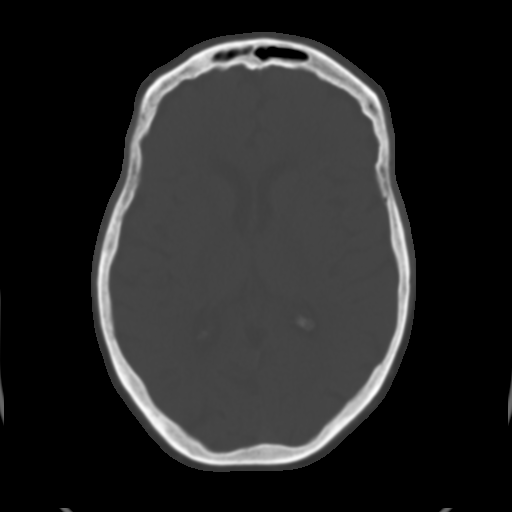
[im 18/31  brain]
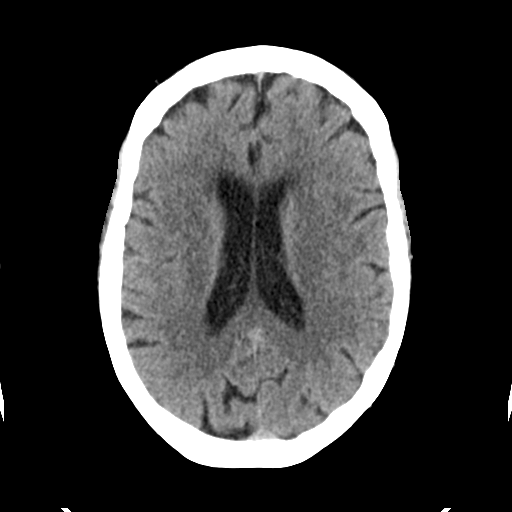
[im 20/31  brain]
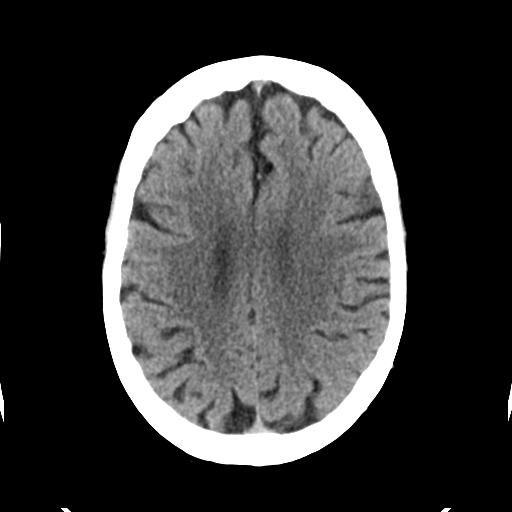
[im 22/31  brain]
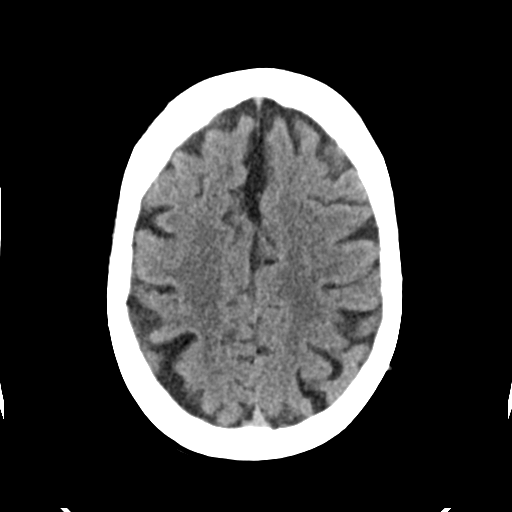
[im 23/31  brain]
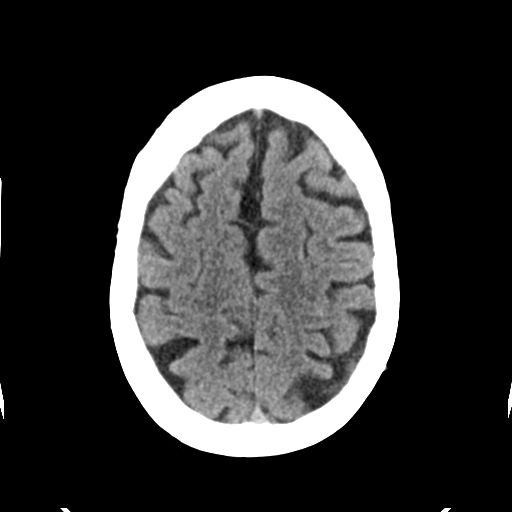
[im 23/31  bone]
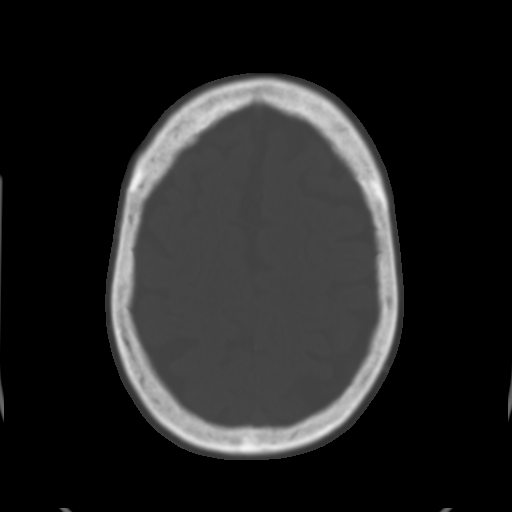
[im 25/31  brain]
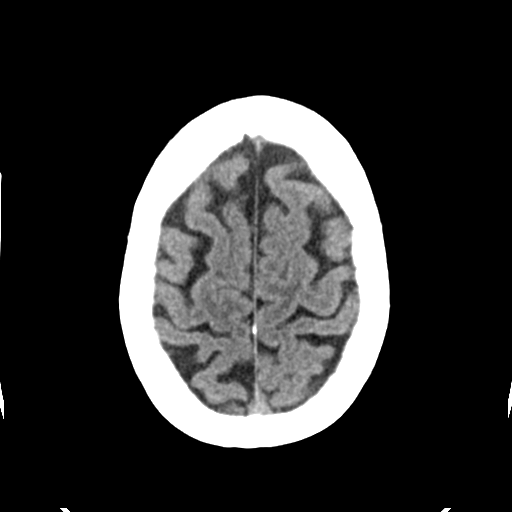
[im 27/31  brain]
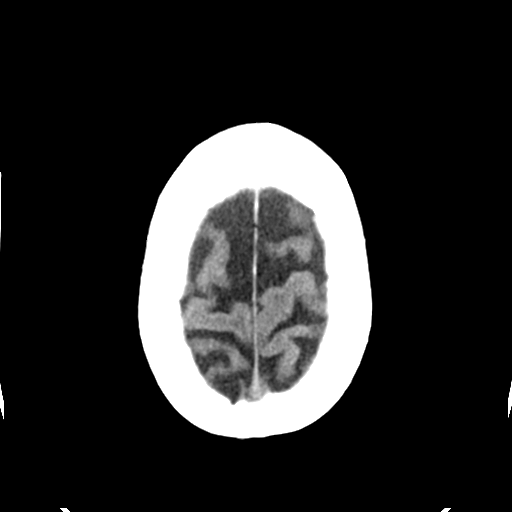
[im 29/31  brain]
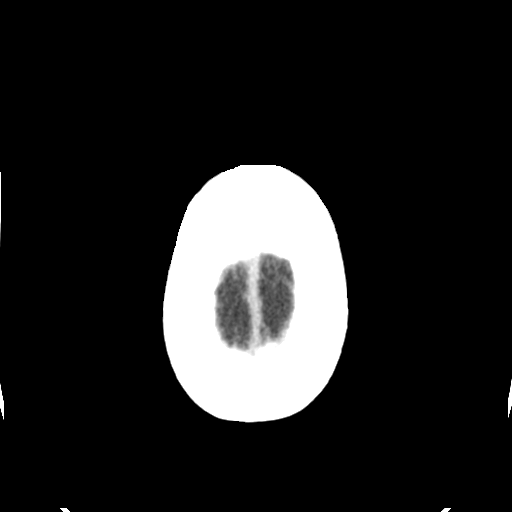

[16 of 30 positions shown; findings below may reference images not displayed]

FINDINGS: Diffuse cerebral atrophy. Mild ventricular dilatation consistent
with central atrophy. Patchy low-attenuation change in the deep
white matter consistent with small vessel ischemia. No mass effect
or midline shift. No abnormal extra-axial fluid collections.
Gray-white matter junctions are distinct. Basal cisterns are not
effaced. No evidence of acute intracranial hemorrhage. No depressed
skull fractures. Visualized paranasal sinuses and mastoid air cells
are not opacified. Vascular calcifications.
IMPRESSION: No acute intracranial abnormalities. Chronic atrophy and small
vessel ischemic changes.

## 2016-10-25 DIAGNOSIS — L900 Lichen sclerosus et atrophicus: Secondary | ICD-10-CM | POA: Diagnosis not present

## 2016-11-10 DIAGNOSIS — H04301 Unspecified dacryocystitis of right lacrimal passage: Secondary | ICD-10-CM | POA: Diagnosis not present

## 2016-11-14 ENCOUNTER — Telehealth: Payer: Self-pay | Admitting: Diagnostic Neuroimaging

## 2016-11-14 NOTE — Telephone Encounter (Signed)
Called husband, on DPR to discuss. He stated his wife had neuropsych testing last fall, and he is in the process of getting her to be seen by Dr Si Raider again. This RN advised him that per Dr Gladstone Lighter last office note, she was to have repeat testing this Dec. Advised that after the testing Dr Si Raider will send Dr Leta Baptist her notes. At that time Dr Leta Baptist may decide he needs to see her sooner than 1 year FU in March. Patient's husband stated that was his thought, but he wanted to check with this RN first.  He verbalized understanding, appreciation of call back.

## 2016-11-14 NOTE — Telephone Encounter (Signed)
Patient's husband is calling. Patient has seen Dr. Marion Downer at [facility]. The patient's cognitive  condition has worsened. Does she need to follow up with Dr, Marion Downer or see Dr. Leta Baptist? I advised Dr. Leta Baptist is out of the office until next Tuesday. He would appreciate a call back from the nurse.

## 2016-11-16 DIAGNOSIS — F0390 Unspecified dementia without behavioral disturbance: Secondary | ICD-10-CM | POA: Diagnosis not present

## 2016-11-21 DIAGNOSIS — R262 Difficulty in walking, not elsewhere classified: Secondary | ICD-10-CM | POA: Diagnosis not present

## 2016-11-21 DIAGNOSIS — R278 Other lack of coordination: Secondary | ICD-10-CM | POA: Diagnosis not present

## 2016-11-21 DIAGNOSIS — M6281 Muscle weakness (generalized): Secondary | ICD-10-CM | POA: Diagnosis not present

## 2016-11-21 DIAGNOSIS — R2681 Unsteadiness on feet: Secondary | ICD-10-CM | POA: Diagnosis not present

## 2016-11-22 DIAGNOSIS — R2681 Unsteadiness on feet: Secondary | ICD-10-CM | POA: Diagnosis not present

## 2016-11-22 DIAGNOSIS — R262 Difficulty in walking, not elsewhere classified: Secondary | ICD-10-CM | POA: Diagnosis not present

## 2016-11-22 DIAGNOSIS — R278 Other lack of coordination: Secondary | ICD-10-CM | POA: Diagnosis not present

## 2016-11-22 DIAGNOSIS — M6281 Muscle weakness (generalized): Secondary | ICD-10-CM | POA: Diagnosis not present

## 2016-11-26 DIAGNOSIS — R2681 Unsteadiness on feet: Secondary | ICD-10-CM | POA: Diagnosis not present

## 2016-11-26 DIAGNOSIS — R262 Difficulty in walking, not elsewhere classified: Secondary | ICD-10-CM | POA: Diagnosis not present

## 2016-11-26 DIAGNOSIS — R278 Other lack of coordination: Secondary | ICD-10-CM | POA: Diagnosis not present

## 2016-11-26 DIAGNOSIS — M6281 Muscle weakness (generalized): Secondary | ICD-10-CM | POA: Diagnosis not present

## 2016-11-30 DIAGNOSIS — R278 Other lack of coordination: Secondary | ICD-10-CM | POA: Diagnosis not present

## 2016-11-30 DIAGNOSIS — L900 Lichen sclerosus et atrophicus: Secondary | ICD-10-CM | POA: Diagnosis not present

## 2016-11-30 DIAGNOSIS — R262 Difficulty in walking, not elsewhere classified: Secondary | ICD-10-CM | POA: Diagnosis not present

## 2016-11-30 DIAGNOSIS — M6281 Muscle weakness (generalized): Secondary | ICD-10-CM | POA: Diagnosis not present

## 2016-11-30 DIAGNOSIS — R2681 Unsteadiness on feet: Secondary | ICD-10-CM | POA: Diagnosis not present

## 2016-12-02 DIAGNOSIS — R109 Unspecified abdominal pain: Secondary | ICD-10-CM | POA: Diagnosis not present

## 2016-12-03 DIAGNOSIS — R278 Other lack of coordination: Secondary | ICD-10-CM | POA: Diagnosis not present

## 2016-12-03 DIAGNOSIS — R2681 Unsteadiness on feet: Secondary | ICD-10-CM | POA: Diagnosis not present

## 2016-12-03 DIAGNOSIS — M6281 Muscle weakness (generalized): Secondary | ICD-10-CM | POA: Diagnosis not present

## 2016-12-03 DIAGNOSIS — R262 Difficulty in walking, not elsewhere classified: Secondary | ICD-10-CM | POA: Diagnosis not present

## 2016-12-18 DIAGNOSIS — K5900 Constipation, unspecified: Secondary | ICD-10-CM | POA: Diagnosis not present

## 2016-12-18 DIAGNOSIS — K602 Anal fissure, unspecified: Secondary | ICD-10-CM | POA: Diagnosis not present

## 2016-12-18 DIAGNOSIS — M25519 Pain in unspecified shoulder: Secondary | ICD-10-CM | POA: Diagnosis not present

## 2016-12-18 DIAGNOSIS — L290 Pruritus ani: Secondary | ICD-10-CM | POA: Diagnosis not present

## 2017-01-24 DIAGNOSIS — N3941 Urge incontinence: Secondary | ICD-10-CM | POA: Diagnosis not present

## 2017-01-31 ENCOUNTER — Encounter: Payer: Medicare HMO | Admitting: Psychology

## 2017-02-05 DIAGNOSIS — E038 Other specified hypothyroidism: Secondary | ICD-10-CM | POA: Diagnosis not present

## 2017-02-05 DIAGNOSIS — R82998 Other abnormal findings in urine: Secondary | ICD-10-CM | POA: Diagnosis not present

## 2017-02-05 DIAGNOSIS — M810 Age-related osteoporosis without current pathological fracture: Secondary | ICD-10-CM | POA: Diagnosis not present

## 2017-02-05 DIAGNOSIS — E7849 Other hyperlipidemia: Secondary | ICD-10-CM | POA: Diagnosis not present

## 2017-02-08 DIAGNOSIS — W01198A Fall on same level from slipping, tripping and stumbling with subsequent striking against other object, initial encounter: Secondary | ICD-10-CM | POA: Diagnosis not present

## 2017-02-08 DIAGNOSIS — M25551 Pain in right hip: Secondary | ICD-10-CM | POA: Diagnosis not present

## 2017-02-08 DIAGNOSIS — S41102A Unspecified open wound of left upper arm, initial encounter: Secondary | ICD-10-CM | POA: Diagnosis not present

## 2017-02-08 DIAGNOSIS — M25552 Pain in left hip: Secondary | ICD-10-CM | POA: Diagnosis not present

## 2017-02-08 DIAGNOSIS — M542 Cervicalgia: Secondary | ICD-10-CM | POA: Diagnosis not present

## 2017-02-13 DIAGNOSIS — Z853 Personal history of malignant neoplasm of breast: Secondary | ICD-10-CM | POA: Diagnosis not present

## 2017-02-13 DIAGNOSIS — J302 Other seasonal allergic rhinitis: Secondary | ICD-10-CM | POA: Diagnosis not present

## 2017-02-13 DIAGNOSIS — R32 Unspecified urinary incontinence: Secondary | ICD-10-CM | POA: Diagnosis not present

## 2017-02-13 DIAGNOSIS — E7849 Other hyperlipidemia: Secondary | ICD-10-CM | POA: Diagnosis not present

## 2017-02-13 DIAGNOSIS — M810 Age-related osteoporosis without current pathological fracture: Secondary | ICD-10-CM | POA: Diagnosis not present

## 2017-02-13 DIAGNOSIS — R413 Other amnesia: Secondary | ICD-10-CM | POA: Diagnosis not present

## 2017-02-13 DIAGNOSIS — Z0000 Encounter for general adult medical examination without abnormal findings: Secondary | ICD-10-CM | POA: Diagnosis not present

## 2017-02-13 DIAGNOSIS — E038 Other specified hypothyroidism: Secondary | ICD-10-CM | POA: Diagnosis not present

## 2017-02-13 DIAGNOSIS — F418 Other specified anxiety disorders: Secondary | ICD-10-CM | POA: Diagnosis not present

## 2017-02-18 DIAGNOSIS — Z1212 Encounter for screening for malignant neoplasm of rectum: Secondary | ICD-10-CM | POA: Diagnosis not present

## 2017-02-21 DIAGNOSIS — H04123 Dry eye syndrome of bilateral lacrimal glands: Secondary | ICD-10-CM | POA: Diagnosis not present

## 2017-02-21 DIAGNOSIS — H524 Presbyopia: Secondary | ICD-10-CM | POA: Diagnosis not present

## 2017-02-21 DIAGNOSIS — H43811 Vitreous degeneration, right eye: Secondary | ICD-10-CM | POA: Diagnosis not present

## 2017-03-07 DIAGNOSIS — F0390 Unspecified dementia without behavioral disturbance: Secondary | ICD-10-CM | POA: Diagnosis not present

## 2017-03-07 DIAGNOSIS — K5900 Constipation, unspecified: Secondary | ICD-10-CM | POA: Diagnosis not present

## 2017-03-07 DIAGNOSIS — N3941 Urge incontinence: Secondary | ICD-10-CM | POA: Diagnosis not present

## 2017-03-26 ENCOUNTER — Encounter: Payer: Self-pay | Admitting: Diagnostic Neuroimaging

## 2017-03-26 ENCOUNTER — Ambulatory Visit: Payer: Medicare HMO | Admitting: Diagnostic Neuroimaging

## 2017-03-26 VITALS — BP 115/73 | HR 86 | Ht 63.0 in | Wt 157.4 lb

## 2017-03-26 DIAGNOSIS — F03A0 Unspecified dementia, mild, without behavioral disturbance, psychotic disturbance, mood disturbance, and anxiety: Secondary | ICD-10-CM

## 2017-03-26 DIAGNOSIS — R42 Dizziness and giddiness: Secondary | ICD-10-CM | POA: Diagnosis not present

## 2017-03-26 DIAGNOSIS — F0781 Postconcussional syndrome: Secondary | ICD-10-CM | POA: Diagnosis not present

## 2017-03-26 DIAGNOSIS — R413 Other amnesia: Secondary | ICD-10-CM

## 2017-03-26 DIAGNOSIS — F411 Generalized anxiety disorder: Secondary | ICD-10-CM

## 2017-03-26 DIAGNOSIS — F0390 Unspecified dementia without behavioral disturbance: Secondary | ICD-10-CM

## 2017-03-26 NOTE — Progress Notes (Signed)
[facility]  PATIENT: [name] DOB: 1930/01/13  REFERRING CLINICIAN: HISTORY FROM: patient  REASON FOR VISIT: follow up    HISTORICAL  CHIEF COMPLAINT:  Chief Complaint  Patient presents with  . Follow-up  . Memory Loss    mmse 21/30.    Marland Kitchen Peripheral Neuropathy    Lives at [place] now, using rollator, has had some falls.     HISTORY OF PRESENT ILLNESS:  UPDATE (03/26/17, VRP): Since last visit, doing well. Tolerating meds. No alleviating or aggravating factors. Continues with balance issues, memory loss, pain issues.   UPDATE 03/26/16: Since last visit, patient continues with memory loss issues. Some intermittent headaches, sometimes triggered by head movement, sometimes triggered by altitude (visiting [place]). Continues with anxiety issues. Some trip and falls.  UPDATE 10/31/15: Since last visit, patient now complaining of increasing memory problems. High stress levels, increased worry, poor sleep. Needs 1/3 tab ambien per night.   UPDATE 03/29/15: Since last visit, was doing well, then had episode of standing up --> then had sudden discomfort in head, prickly feeling, hot feeling, dizzy. Then next day had dizziness with head turned to the right. Then sometimes with head left. Now sxs slightly eased. Still with insomnia (interrupted sleep), daytime fatigue, grieving son's death in 29-Oct-2013. Goes to sleep at 10pm. Then takes small ambien every night after midnight when she might wake up.   UPDATE 01/20/15: Since last visit, was stable, until Dec 2016, then more headaches. Now HA are stabilized. Some more insomnia, anxiety issues, reports still grieving re: son's death in Sep 28, 2013.   UPDATE 05/14/14: Since last vist doing much better with PT evaluation and exercises ([facility]). She is pleased with progress. She feels stronger and and safer with her balance.  UPDATE 03/08/14: Since last visit, patient was traveling overseas to [place]  when she tripped and fell on a concrete slab, falling on her right face and right wrist. Patient suffered significant right periorbital ecchymosis, facial bruising, as well as right forearm fracture. Patient continues to have pain in her right head, headaches, balance difficulty. Patient follows up to further investigate neuropathy.  PRIOR HPI (01/20/14): 84 year old right-handed female with hypercholesterolemia, breast cancer, here for evaluation of right scalp and facial discomfort, gait difficulty, postconcussion syndrome. 11/18/2013 patient tripped on her driveway, fell and possibly hit her car in the driveway. She then bounced backwards and landed on her left hip. She had significant left hip pain but didn't feel any had pain initially. After going inside should be developed dizziness clamminess, possible low blood pressure and low pulse rate, and went to the emergency room. Patient was diagnosed with concussion. Since that time she has continued discomfort in her right scalp, especially when her hair is minimally dilated. She has pain that radiates down to her right facial region. Patient has had ongoing intermittent pain, went to the emergency room second time, diagnosed with possible trigeminal neuralgia. Also with remote shingles in the right frontal V1 region many years ago. Patient also having progressive gait and balance difficulty, with at least 5-6 years of numbness and tingling in the toes. Apparently she has been diagnosed with neuropathy, but without specific cause.   REVIEW OF SYSTEMS: Full 14 system review of systems performed and negative except for: memory loss headache incont bladder.    ALLERGIES: Allergies  Allergen Reactions  . Dust Mite Extract Other (See Comments)    Sinus drainage  . Other Other (See Comments)  Smoke, Cat Dander cause sinus drainage  . Norco [Hydrocodone-Acetaminophen] Other (See Comments)    Hallucinations     HOME MEDICATIONS: Outpatient Medications  Prior to Visit  Medication Sig Dispense Refill  . acetaminophen (TYLENOL) 325 MG tablet Take 2 tablets (650 mg total) by mouth every 6 (six) hours as needed for mild pain, moderate pain or fever.    Marland Kitchen aspirin 81 MG chewable tablet Chew by mouth daily.    . busPIRone (BUSPAR) 10 MG tablet Take 10 mg by mouth 2 (two) times daily.  4  . Calcium Carbonate-Vitamin D (CALCIUM 600+D) 600-200 MG-UNIT TABS Take 1 tablet by mouth 2 (two) times daily.    Marland Kitchen esomeprazole (NEXIUM) 20 MG capsule Take 20 mg by mouth every evening.    . fexofenadine (ALLEGRA) 180 MG tablet Take 180 mg by mouth every other day.     Marland Kitchen LORazepam (ATIVAN) 0.5 MG tablet Take 0.5 mg by mouth every 8 (eight) hours as needed for anxiety.    . Multiple Vitamin (MULTIVITAMIN WITH MINERALS) TABS tablet Take 1 tablet by mouth daily.    . nortriptyline (PAMELOR) 25 MG capsule Take 25 mg by mouth at bedtime.    . Omega-3 Fatty Acids (FISH OIL) 1200 MG CAPS Take 1,200 mg by mouth 2 (two) times daily.    . polyethylene glycol (MIRALAX / GLYCOLAX) packet Take 17 g by mouth 2 (two) times daily. 14 each 0  . raloxifene (EVISTA) 60 MG tablet Take 60 mg by mouth daily.    . sertraline (ZOLOFT) 25 MG tablet 25 mg daily.    . SUPER B COMPLEX/C PO Take by mouth. Daily    . traMADol (ULTRAM) 50 MG tablet Take 1-2 tablets (50-100 mg total) by mouth every 6 (six) hours as needed. (Patient taking differently: Take 50-100 mg by mouth every 6 (six) hours as needed for moderate pain. ) 80 tablet 0  . zolpidem (AMBIEN) 10 MG tablet Take 3.33 mg by mouth at bedtime as needed for sleep. 1/3 tablet    . Cholecalciferol 2000 UNITS TABS Take 2,000 Units by mouth at bedtime.    . docusate sodium (COLACE) 100 MG capsule Take 1 capsule (100 mg total) by mouth 2 (two) times daily. 10 capsule 0  . gabapentin (NEURONTIN) 300 MG capsule Take 1 capsule (300 mg total) by mouth 2 (two) times daily. (Patient not taking: Reported on 03/26/2017) 180 capsule 3  . vitamin B-12  (CYANOCOBALAMIN) 1000 MCG tablet Take 1,000 mcg by mouth daily.     No facility-administered medications prior to visit.     PAST MEDICAL HISTORY: Past Medical History:  Diagnosis Date  . Allergic rhinitis   . Anxiety   . Arthritis   . Breast cancer ([place])    left breast cancer  . Complication of anesthesia    memory loss  . Concussion 2015   fell and hit head  . Depression    situational depression  . Diverticulosis   . GERD (gastroesophageal reflux disease)   . Headache(784.0)    sinus HAs  . Hyperlipidemia   . Neuromuscular disorder ([facility])    neuropathy- nerve damage in neck causes nerve damage in toes    PAST SURGICAL HISTORY: Past Surgical History:  Procedure Laterality Date  . ABDOMINAL HYSTERECTOMY    . CHOLECYSTECTOMY N/A 03/04/2015   Procedure: LAPAROSCOPIC CHOLECYSTECTOMY WITH INTRAOPERATIVE CHOLANGIOGRAM;  Surgeon: Greer Pickerel, MD;  Location: [facility];  Service: General;  Laterality: N/A;  . ESOPHAGOGASTRODUODENOSCOPY N/A 08/15/2012  Procedure: ESOPHAGOGASTRODUODENOSCOPY (EGD);  Surgeon: Inda Castle, MD;  Location: [facility];  Service: Endoscopy;  Laterality: N/A;  . MASTECTOMY     left  . NASAL SINUS SURGERY     2004  . ORIF WRIST FRACTURE Right 08/21/2015   Procedure: OPEN REDUCTION INTERNAL FIXATION (ORIF) WRIST FRACTURE;  Surgeon: Dayna Barker, MD;  Location: [facility];  Service: Plastics;  Laterality: Right;  . REPLACEMENT TOTAL KNEE     left  . REVERSE SHOULDER ARTHROPLASTY Left 11/11/2014   Procedure: LEFT REVERSE SHOULDER ARTHROPLASTY;  Surgeon: Justice Britain, MD;  Location: [facility];  Service: Orthopedics;  Laterality: Left;  . SHOULDER SURGERY Bilateral    repairs, rot cuff repair  . STAPEDECTOMY Left    left ear  . TOTAL KNEE ARTHROPLASTY     right  . TOTAL KNEE ARTHROPLASTY Right 08/09/2015   Procedure: TOTAL KNEE ARTHROPLASTY;  Surgeon: Paralee Cancel, MD;  Location: [facility];  Service: Orthopedics;  Laterality: Right;    FAMILY HISTORY: Family  History  Problem Relation Age of Onset  . Colon cancer Father 82  . Breast cancer Mother   . Leukemia Son   . Pancreatic cancer Sister     SOCIAL HISTORY:  Social History   Socioeconomic History  . Marital status: Married    Spouse name: Doren Custard  . Number of children: 3  . Years of education: MBA  . Highest education level: Not on file  Social Needs  . Financial resource strain: Not on file  . Food insecurity - worry: Not on file  . Food insecurity - inability: Not on file  . Transportation needs - medical: Not on file  . Transportation needs - non-medical: Not on file  Occupational History  . Occupation: Retired  Tobacco Use  . Smoking status: Former Smoker    Packs/day: 0.50    Years: 6.00    Pack years: 3.00    Types: Cigarettes    Last attempt to quit: 01/15/1958    Years since quitting: 59.2  . Smokeless tobacco: Never Used  Substance and Sexual Activity  . Alcohol use: No    Alcohol/week: 0.0 oz  . Drug use: No  . Sexual activity: Not on file  Other Topics Concern  . Not on file  Social History Narrative   Patient lives at [place] with spouse.   Caffeine use: none   3 children; 1 deceased     PHYSICAL EXAM  Vitals:   03/26/17 0923  BP: 115/73  Pulse: 86  Weight: 157 lb 6.4 oz (71.4 kg)  Height: 5\' 3"  (1.6 m)   Wt Readings from Last 3 Encounters:  03/26/17 157 lb 6.4 oz (71.4 kg)  03/26/16 150 lb (68 kg)  10/31/15 150 lb 12.8 oz (68.4 kg)   Body mass index is 27.88 kg/m.  MMSE - Mini Mental State Exam 03/26/2017 03/26/2016 10/31/2015  Orientation to time 4 2 4   Orientation to Place 3 5 5   Registration 3 3 3   Attention/ Calculation 1 3 3   Recall 2 2 1   Language- name 2 objects 2 2 2   Language- repeat 0 0 0  Language- follow 3 step command 3 3 3   Language- read & follow direction 1 1 1   Write a sentence 1 1 1   Copy design 1 1 1   Total score 21 23 24    GENERAL EXAM: Patient is in no distress; well developed, nourished and groomed;  neck is supple  CARDIOVASCULAR: Regular rate and rhythm, SYSTOLIC MURMUR OVER  RIGHT PRECORDIAL REGION; no carotid bruits  NEUROLOGIC: MENTAL STATUS: awake, alert, language fluent, comprehension intact, naming intact, fund of knowledge appropriate CRANIAL NERVE: pupils equal and reactive to light, visual fields full to confrontation, extraocular muscles intact, no nystagmus, facial sensation and strength symmetric, hearing intact, palate elevates symmetrically, uvula midline, shoulder shrug symmetric, tongue midline. MOTOR: normal bulk and tone, full strength in the BUE, BLE SENSORY: DECR LT, TEMP IN FEET; ABSENT VIB AT ANKLES AND TOES COORDINATION: finger-nose-finger, fine finger movements normal REFLEXES: deep tendon reflexes TRACE and symmetric GAIT/STATION: narrow based gait; SHORT STEPS; USING ROLLATOR WALKER.    DIAGNOSTIC DATA (LABS, IMAGING, TESTING) - I reviewed patient records, labs, notes, testing and imaging myself where available.  Lab Results  Component Value Date   WBC 11.9 (H) 08/20/2015   HGB 12.4 08/20/2015   HCT 38.7 08/20/2015   MCV 92.4 08/20/2015   PLT 326 08/20/2015      Component Value Date/Time   NA 136 08/20/2015 2205   K 4.3 08/20/2015 2205   CL 103 08/20/2015 2205   CO2 26 08/20/2015 2205   GLUCOSE 115 (H) 08/20/2015 2205   BUN 14 08/20/2015 2205   CREATININE 0.83 08/20/2015 2205   CALCIUM 8.9 08/20/2015 2205   PROT 6.4 (L) 02/25/2015 1047   PROT 6.6 01/20/2014 1237   ALBUMIN 3.5 02/25/2015 1047   AST 23 02/25/2015 1047   ALT 18 02/25/2015 1047   ALKPHOS 60 02/25/2015 1047   BILITOT 0.8 02/25/2015 1047   GFRNONAA >60 08/20/2015 2205   GFRAA >60 08/20/2015 2205   No results found for: CHOL, HDL, LDLCALC, LDLDIRECT, TRIG, CHOLHDL Lab Results  Component Value Date   HGBA1C 5.7 (H) 01/20/2014   Lab Results  Component Value Date   VITAMINB12 >1999 (H) 01/20/2014   Lab Results  Component Value Date   TSH 1.200 01/20/2014    01/02/14 CT  head / maxillofacial 1. No acute intracranial pathology. 2. No acute osseous injury of the maxillofacial bones. 3. Mild generalized cerebral atrophy and chronic microvascular disease.  09/10/13 CTA head/neck 1. Negative for age CTA head and neck. 2. Stable CT appearance of the brain.  01/20/14 Neuropathy panel - negative  03/15/14 MRI lumbar spine demonstrating multilevel degenerative changes as detailed above. Specifically: 1. Moderate left foraminal narrowing at L1-L2 due to disc protrusion, endplate spurring and facet hypertrophy could lead to dynamic impingement of the left L1 nerve root  2. Moderately severe right lateral recess stenosis at L2-L3 due to disc protrusion, endplate spurring, mild facet hypertrophy leading to mild spinal stenosis. There could be right L3 nerve root compression at this level. 3. Moderately to severe fairly narrowed neural foramina and mild to moderate lateral recess stenosis at L3-L4 due to disc protrusion, endplate spurring and facet hypertrophy causing mild spinal stenosis. There could be impingement of either of the exiting L3 nerve root with less potential for dynamic impingement of the L4 nerve roots. 4. Moderate bilateral foraminal narrowing at L4-L5 due to disc bulge and facet hypertrophy that could lead to dynamic impingement of the exiting L4 nerve roots   5. Severe right lateral recess stenosis and bilateral moderate foraminal narrowing at L5-S1 due to anterolisthesis, severe facet hypertrophy and right paramedian disc herniation. There appears to be compression of the right S1 nerve root and there could also be dynamic impingement of the exiting L5 nerve roots.  03/19/14 EMG/NCS  1. Severe length dependent axonal sensorimotor polyneuropathy. 2. Bilateral lower lumbar radiculopathies.  04/11/15 CT head  1.    Mild cortical atrophy, unchanged when compared to the 01/02/2014 CT scan. The extent of atrophy is typical for age. 2.    Mild to moderate  chronic microvascular ischemic changes, essentially unchanged when compared to the prior scan. 3.    There are no acute findings.   12/19/15 neuropsych testing - Clinical Impressions: Mild dementia, unspecified (rule out Alzheimer's disease). Generalized anxiety disorder.      ASSESSMENT AND PLAN  84 y.o. year old female with postconcussion syndrome (fall on 11/19/13) with resultant scalp numbness/sensitivity, facial pain. Also with suspected underlying peripheral neuropathy for past 5-6 years, without clear etiology (possible hereditary neuropathy). Then second fall on Feb 19, 2014, with right facial trauma (no fractures) and right forearm fracture.  EMG and MRI lumbar spine confirm combination of severe axonal neuropathy + lumbar polyradiculopathy. I do not think patient would be a good surgical candidate at this time due to combination of neuropathy + lumbar spine disease, lack of back pain, advanced age, and relatively good improvement with conservative therapy. PT evaluation and exercises have significantly improved her balance and walking.  Also with short term memory loss, likely related to mild dementia.   Also with intermittent headaches, likely post-traumatic headaches.   Cannot have MRI brain --> due to stapedectomy and metal in ears.    Dx: idiopathic hereditary neuropathy + lumbar polyradiculopathy + post-traumatic headaches + dementia + anxiety/insomnia  Mild dementia  Generalized anxiety disorder  Memory loss  Post concussion syndrome  Dizziness and giddiness    PLAN:  GAIT DIFFICULTY - gradually increase physical activity / movement; will refer to physical therapy for balance training  - use rollator walker for safety  MEMORY LOSS / DEMENTIA - optimize nutrition and exercise; reviewed MIND diet - may consider donepezil / memantine; patient and husband would like to hold off for now  HEADACHES - continue tylenol as needed  NECK PAIN / KNEE PAIN -  continue tramadol and tylenol (as needed per PCP)  Return if symptoms worsen or fail to improve, for return to PCP.    Penni Bombard, MD 4/94/4967, 5:91 AM Certified in Neurology, Neurophysiology and Neuroimaging  [facility] [address] [place],  [address] [phone]

## 2017-04-10 DIAGNOSIS — E663 Overweight: Secondary | ICD-10-CM | POA: Diagnosis not present

## 2017-04-10 DIAGNOSIS — R2681 Unsteadiness on feet: Secondary | ICD-10-CM | POA: Diagnosis not present

## 2017-04-10 DIAGNOSIS — M6281 Muscle weakness (generalized): Secondary | ICD-10-CM | POA: Diagnosis not present

## 2017-04-10 DIAGNOSIS — M150 Primary generalized (osteo)arthritis: Secondary | ICD-10-CM | POA: Diagnosis not present

## 2017-04-10 DIAGNOSIS — M542 Cervicalgia: Secondary | ICD-10-CM | POA: Diagnosis not present

## 2017-04-10 DIAGNOSIS — N3941 Urge incontinence: Secondary | ICD-10-CM | POA: Diagnosis not present

## 2017-04-10 DIAGNOSIS — M5030 Other cervical disc degeneration, unspecified cervical region: Secondary | ICD-10-CM | POA: Diagnosis not present

## 2017-04-10 DIAGNOSIS — Z6826 Body mass index (BMI) 26.0-26.9, adult: Secondary | ICD-10-CM | POA: Diagnosis not present

## 2017-04-11 ENCOUNTER — Encounter: Payer: Medicare HMO | Admitting: Psychology

## 2017-04-11 DIAGNOSIS — N3941 Urge incontinence: Secondary | ICD-10-CM | POA: Diagnosis not present

## 2017-04-11 DIAGNOSIS — M6281 Muscle weakness (generalized): Secondary | ICD-10-CM | POA: Diagnosis not present

## 2017-04-11 DIAGNOSIS — R2681 Unsteadiness on feet: Secondary | ICD-10-CM | POA: Diagnosis not present

## 2017-04-17 DIAGNOSIS — N3941 Urge incontinence: Secondary | ICD-10-CM | POA: Diagnosis not present

## 2017-04-17 DIAGNOSIS — M4802 Spinal stenosis, cervical region: Secondary | ICD-10-CM | POA: Diagnosis not present

## 2017-04-17 DIAGNOSIS — M6281 Muscle weakness (generalized): Secondary | ICD-10-CM | POA: Diagnosis not present

## 2017-04-17 DIAGNOSIS — R2681 Unsteadiness on feet: Secondary | ICD-10-CM | POA: Diagnosis not present

## 2017-04-19 DIAGNOSIS — N3941 Urge incontinence: Secondary | ICD-10-CM | POA: Diagnosis not present

## 2017-04-19 DIAGNOSIS — R2681 Unsteadiness on feet: Secondary | ICD-10-CM | POA: Diagnosis not present

## 2017-04-19 DIAGNOSIS — M6281 Muscle weakness (generalized): Secondary | ICD-10-CM | POA: Diagnosis not present

## 2017-04-29 DIAGNOSIS — M25552 Pain in left hip: Secondary | ICD-10-CM | POA: Diagnosis not present

## 2017-04-29 DIAGNOSIS — M545 Low back pain: Secondary | ICD-10-CM | POA: Diagnosis not present

## 2017-04-29 DIAGNOSIS — M5136 Other intervertebral disc degeneration, lumbar region: Secondary | ICD-10-CM | POA: Diagnosis not present

## 2017-04-29 DIAGNOSIS — M4186 Other forms of scoliosis, lumbar region: Secondary | ICD-10-CM | POA: Diagnosis not present

## 2017-05-11 DIAGNOSIS — M5030 Other cervical disc degeneration, unspecified cervical region: Secondary | ICD-10-CM | POA: Diagnosis not present

## 2017-06-07 DIAGNOSIS — Z96612 Presence of left artificial shoulder joint: Secondary | ICD-10-CM | POA: Diagnosis not present

## 2017-06-07 DIAGNOSIS — R0781 Pleurodynia: Secondary | ICD-10-CM | POA: Diagnosis not present

## 2017-06-14 DIAGNOSIS — L720 Epidermal cyst: Secondary | ICD-10-CM | POA: Diagnosis not present

## 2017-06-14 DIAGNOSIS — L814 Other melanin hyperpigmentation: Secondary | ICD-10-CM | POA: Diagnosis not present

## 2017-06-14 DIAGNOSIS — L723 Sebaceous cyst: Secondary | ICD-10-CM | POA: Diagnosis not present

## 2017-06-14 DIAGNOSIS — L900 Lichen sclerosus et atrophicus: Secondary | ICD-10-CM | POA: Diagnosis not present

## 2017-06-14 DIAGNOSIS — L821 Other seborrheic keratosis: Secondary | ICD-10-CM | POA: Diagnosis not present

## 2017-06-20 ENCOUNTER — Ambulatory Visit (INDEPENDENT_AMBULATORY_CARE_PROVIDER_SITE_OTHER): Payer: Medicare HMO | Admitting: Psychology

## 2017-06-20 ENCOUNTER — Encounter

## 2017-06-20 ENCOUNTER — Ambulatory Visit: Payer: Medicare HMO | Admitting: Psychology

## 2017-06-20 ENCOUNTER — Encounter: Payer: Self-pay | Admitting: Psychology

## 2017-06-20 DIAGNOSIS — F03A0 Unspecified dementia, mild, without behavioral disturbance, psychotic disturbance, mood disturbance, and anxiety: Secondary | ICD-10-CM

## 2017-06-20 DIAGNOSIS — F0390 Unspecified dementia without behavioral disturbance: Secondary | ICD-10-CM

## 2017-06-20 NOTE — Progress Notes (Signed)
   Neuropsychology Note  [name] completed 60 minutes of neuropsychological testing with technician, [name], BS, under the supervision of Dr. Macarthur Critchley, Licensed Psychologist. The patient did not appear overtly distressed by the testing session, per behavioral observation or via self-report to the technician. Rest breaks were offered.   Clinical Decision Making: In considering the patient's current level of functioning, level of presumed impairment, nature of symptoms, emotional and behavioral responses during the interview, level of literacy, and observed level of motivation/effort, a battery of tests was selected and communicated to the psychometrician.  Communication between the psychologist and technician was ongoing throughout the testing session and changes were made as deemed necessary based on patient performance on testing, technician observations and additional pertinent factors such as those listed above.  [name] will return within approximately 2 weeks for an interactive feedback session with [name] at which time her test performances, clinical impressions and treatment recommendations will be reviewed in detail. The patient understands she can contact our office should she require our assistance before this time.  35 minutes spent performing neuropsychological evaluation services/clinical decision making (psychologist). [CPT 66440] 60 minutes spent face-to-face with patient administering standardized tests, 30 minutes spent scoring (technician). [CPT [id number], 34742]  Full report to follow.

## 2017-06-20 NOTE — Progress Notes (Signed)
NEUROBEHAVIORAL STATUS EXAM   Name: [name] Date of Birth: 04/14/1929 Date of Interview: 06/20/2017  Reason for Referral:  [name] is a 84 y.o. female who is referred for neuropsychological re-evaluation by Dr. Andrey Spearman of [facility] due to concerns about worsening dementia. She was previously evaluated by me in 11/2015, diagnosed with mild dementia, unspecified (rule out AD).   This patient is accompanied in the office by her husband, Abbe Amsterdam, who supplements the history.  History of Presenting Problem [11/29/2015]:  [name] has been followed by Dr. Leta Baptist since 01/20/2014 after she sustained a concussion secondary to a fall in 11/2013. She had another fall in January 2016 with possible concussion. She was last seen by Dr. Leta Baptist on 10/31/2015 and scored 24/30 on the MMSE. At this most recent visit, she reported concerns about memory decline. The patient also has a history of neuropathy and post-concussion headaches.  At today's visit, the patient and her husband reported concerns about failing memory and inability to recall names. She has become concerned in the past year and her husband has noticed more in the past 6-8 months. He feels that the memory issues came on "pretty suddenly". She feels they're worsening over time. She has had 4 surgeries with anesthesia in one year. She and her husband denied any pronounced cognitive or behavioral change after she sustained the falls in late 2015 and early 2016. They reported that her headaches have been relatively minimal over the past 2 months so it was decided to reduce gabapentin. Unfortunately, she started having pain again this morning on the right side of her face where she fell in the past.  The patient reported that her memory retention used to be an asset but is now an area of significant weakness. She reported that she had to give up all 3 bridge clubs in which she was previously  participating because she could not remember what people had just said and she could not learn the new conventions.  Upon direct questioning, the patient and her husband also reportedthe following:   Forgetting recent conversations/events: Yes, particularly things she has been told . More difficult to retrieve recent events. Repeating statements/questions: Yes, occasionally Misplacing/losing items: Yes Forgetting appointments or other obligations: No, because her husband helps. She thinks she might forget without Phil's help. She does have difficulty thinking ahead to upcoming appointments.  Forgetting to take medications: Phil prepares her pillboxes and reminds her to take them.   Difficulty concentrating: Yes butshe says she hasalways had difficulty with this (e.g., daydreaming in school). Starting but not finishing tasks: No, if someone is depending on me, I finish it by a deadline. Her husband agrees she follows through on things quite well.   Word-finding difficulty: Yes. She has to talk around the word. Her husband notices this as well.  Writing difficulty: Making more errors Spelling difficulty: No Comprehension difficulty: She says she reads over things several times but that has always been the case, even back in school.  Getting lost when driving: She has not been able to drive because of neuropathy inherfeet over past several months, but recently she is doing some "minor driving" when her husband is with her. Uncertain about directions when driving or passenger: She is becoming more confused about locations in [place], where roads and familiar places arelocated.   Her husband manages the finances. She reported she has never been good with numbers and therefore has never been responsible for the finances.  Her husband also does the majority of the cooking although when they're having company she will prepare main dishes.  The patient does note a lifelong history of  anxiety. She has always been perfectionistic and somewhat rigid. She worries a lot about upcoming events and how they will go. Her husband noted that she demonstrates new nervous habits -i.e., frequently picking fingernails. She takes Lorazepam PRN. She is also now taking Buspar (started 4-5 mos ago); it is unclear if this has been beneficial. The dosage of Buspar was recently increased. Her husbandsays she gets "inordinately concerned" about things. She has difficulty sleeping because she is up worrying about things. She takes 1/3 of and Ambientabletsome nights. Her husband thinks she is sleeping reasonably well. She does take daytime naps sometimes.  The patient also reported significant grief and depression surrounding the loss of her son to leukemia almost 2 years ago. She reported, "It tore our lives apart, its the worst thing that has happened and my life." She did see a counselor through hospice for a while. She denied any prior history of depression before her son's death.  The patient reported that she feels she does not have the enthusiasm that she used to have. She reported that she is more passive. She also reported reduced energy. About 3 months ago, her husband reported that she was eating very little. However her appetite has recently increased and she is eating normal meals again.  [name] reported that she used to enjoy an evening cocktail but since her concussion and she has developed headaches when she drinks alcohol. Due to this, she quit drinking. Seemingly associated with her quitting drinking, she lost 33 pounds.  Most recent neuroimaging on file is a CT of the head from 04/08/2015. This reportedly revealed:  1. Mild cortical atrophy, unchanged when compared to the 01/02/2014 CT scan. The extent of atrophy is typical for age. 2. Mild to moderate chronic microvascular ischemic changes, essentially unchanged when compared to the prior scan. 3. There are no acute  findings.    Results of 11/2015 neurocognitive evaluation [12/19/2015]: Clinical Impressions: Mild dementia, unspecified (rule out Alzheimer's disease). Generalized anxiety disorder. Results of cognitive testing were abnormal; specifically, there were several areas of impairment, including psychomotor processing speed, confrontation naming, auditory comprehension, encoding and retrieval of both verbal and non-verbal information, mental flexibility and set-shifting, and clock drawing. Furthermore, there is evidence that her cognitive deficits are interfering with her ability to manage complex ADLs such as medications, appointments and driving. As such, diagnostic criteria for a dementia syndrome are met. The patient's cognitive profile does not clearly reflect cortical versus subcortical features, and she may have both cortical and subcortical involvement. Based on her husband's report of course of symptoms, these deficits do not appear to be a direct result of her concussions in 2015/2016. I suspect there may be an underlying neurodegenerative disorder such as Alzheimer's disease that may have been exacerbated by multiple surgeries with anesthesia. Vascular dementia, due to chronic small vessel ischemic changes, could also be possible. The patient also has a history of generalized anxiety disorder and continues to demonstrate symptoms of this disorder.    Interim History and Current Functioning [06/20/2017]: Since undergoing initial evaluation with me 1.5 years ago, the patient and her husband report worsening cognitive functioning. She will have long conversations with new people they meet and then the next day if she sees them she does not recognize them or recall having had a conversation. She does recognize friends/family  that she has known for a long time but she has trouble recalling names of friends if she hasn't seen them in a while. She has some difficulty navigating where things are at [place].  Her husband assists her with most things. He does all the driving and manages her medications and appointments. He has always managed the finances. Their meals are provided by [employer]. She is still a great conversationalist, per her husband, but does repeat stories. She continues to have word finding difficulty. She also loses her train of thought in speaking. Comprehension is reportedly intact.   Her husband reports that around the end of summer 2018 she started having even more falls and he could see that they were going to need increased care, so they moved to [place] in October. Unfortunately they have not been very happy there. They will be moving to [place] as soon as an apartment comes available.   Her husband does not believe she appears significantly anxious. She is no longer on Buspar. She takes lorazepam once daily. She is not having any difficulty sleeping (takes 1/3 of ambien tablet nightly). She takes a daytime nap. She has low energy at times. Appetite is normal. She did not drink alcohol for about a year as she found it was giving her headaches. Recently she started drinking some of her husband's beer. It doesn't seem to be causing headaches. She has less than one beer per day.   She complains of tremendous arthritis and neck pain.   I see from reviewing her records that she was seen at [facility] ED in 01/2016 when she hit her head on a car door. There was no loss of consciousness but her family felt she was more confused since then. CT head was negative.  I also see a downward trend in MMSE scores over time at Dr. Gladstone Lighter visits:  10/31/2015: 24/30 03/26/2016: 23/30 03/26/2017: 21/30    Social History: Born/Raised: [place] Education: Bachelor's degree in Network engineer, Master's in guidance counseling. 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 She was a homemaker when her children were young and in school. She did substitute teaching. For most of her adult years, she dedicated herself to PTA, volunteeringand various community organizations. Marital history: Married since 68. They had 3 children; one is now deceased.   Medical History: Past Medical History:  Diagnosis Date  . Allergic rhinitis   . Anxiety   . Arthritis   . Breast cancer ([place])    left breast cancer  . Complication of anesthesia    memory loss  . Concussion 2015   fell and hit head  . Depression    situational depression  . Diverticulosis   . GERD (gastroesophageal reflux disease)   . Headache(784.0)    sinus HAs  . Hyperlipidemia   . Neuromuscular disorder (HCC)    neuropathy- nerve damage in neck causes nerve damage in toes      Current Medications:  Outpatient Encounter Medications as of 06/20/2017  Medication Sig  . acetaminophen (TYLENOL) 325 MG tablet Take 2 tablets (650 mg total) by mouth every 6 (six) hours as needed for mild pain, moderate pain or fever.  Marland Kitchen aspirin 81 MG chewable tablet Chew by mouth daily.  . busPIRone (BUSPAR) 10 MG tablet Take 10 mg by mouth 2 (two) times daily.  . Calcium Carbonate-Vitamin D (CALCIUM 600+D) 600-200 MG-UNIT TABS Take  1 tablet by mouth 2 (two) times daily.  Marland Kitchen esomeprazole (NEXIUM) 20 MG capsule Take 20 mg by mouth every evening.  . fexofenadine (ALLEGRA) 180 MG tablet Take 180 mg by mouth every other day.   . gabapentin (NEURONTIN) 300 MG capsule Take 1 capsule (300 mg total) by mouth 2 (two) times daily. (Patient not taking: Reported on 03/26/2017)  . LORazepam (ATIVAN) 0.5 MG tablet Take 0.5 mg by mouth every 8 (eight) hours as needed for anxiety.  . Multiple Vitamin (MULTIVITAMIN WITH MINERALS) TABS tablet Take 1 tablet by mouth daily.  . nortriptyline (PAMELOR) 25 MG capsule Take 25 mg by mouth at bedtime.  . Omega-3 Fatty Acids (FISH OIL) 1200 MG CAPS Take 1,200 mg by mouth 2 (two) times daily.    . polyethylene glycol (MIRALAX / GLYCOLAX) packet Take 17 g by mouth 2 (two) times daily.  . raloxifene (EVISTA) 60 MG tablet Take 60 mg by mouth daily.  . sertraline (ZOLOFT) 25 MG tablet 25 mg daily.  . SUPER B COMPLEX/C PO Take by mouth. Daily  . traMADol (ULTRAM) 50 MG tablet Take 1-2 tablets (50-100 mg total) by mouth every 6 (six) hours as needed. (Patient taking differently: Take 50-100 mg by mouth every 6 (six) hours as needed for moderate pain. )  . zolpidem (AMBIEN) 10 MG tablet Take 3.33 mg by mouth at bedtime as needed for sleep. 1/3 tablet   No facility-administered encounter medications on file as of 06/20/2017.    Patient's husband reports she is not taking Buspar.  Behavioral Observations:   Appearance: Appropriately dressed but mildly disheveled compared to last evaluation in 2017 Gait:Ambulated independently,mild unsteadiness observed Speech: Generally fluent but stuttering at times with moderate word finding (increased from 2017). She also loses her train of thought and has to be redirected to the original question. Thought process: Tangential Affect: Full, appropriate to context, mildly anxious Interpersonal: Very pleasant, appropriate, engaged    35 minutes spent face-to-face with patient completing neurobehavioral status exam. 35 minutes spent integrating medical records/clinical data and completing this report. CPT code [id number] unit.   TESTING: There is medical necessity to proceed with neuropsychological assessment as the results will be used to aid in differential diagnosis and clinical decision-making and to inform specific treatment recommendations. Per the patient, her husband and medical records reviewed, there has been a change in cognitive functioning and a reasonable suspicion of worsening dementia.  Clinical Decision Making: In considering the patient's current level of functioning, level of presumed impairment, nature of symptoms, emotional and  behavioral responses during the interview, level of literacy, and observed level of motivation, a battery of tests was selected and communicated to the psychometrician.   Following the clinical interview/neurobehavioral status exam, the patient completed this full battery of neuropsychological testing with my psychometrician under my supervision (see separate note).   PLAN: The patient will return to see me for a follow-up session at which time her test performances and my impressions and treatment recommendations will be reviewed in detail.  Evaluation ongoing; full report to follow.

## 2017-06-28 NOTE — Progress Notes (Signed)
NEUROPSYCHOLOGICAL EVALUATION   Name:    [name]  Date of Birth:   1929/04/19 Date of Interview:  06/20/2017 Date of Testing:  06/20/2017   Date of Feedback:  07/01/2017       Background Information:  Reason for Referral:  [name] is a 84 y.o. female referred by Dr. Andrey Spearman of [facility] to assess her current level of cognitive functioning and assist in differential diagnosis. The current evaluation consisted of a review of available medical records, an interview with the patient and her husband, Abbe Amsterdam, and the completion of a neuropsychological testing battery. Informed consent was obtained.  History of Presenting Problem [11/29/2015]:  [name] has been followed by Dr. Leta Baptist since 01/20/2014 after she sustained a concussion secondary to a fall in 11/2013. She had another fall in January 2016 with possible concussion. She was last seen by Dr. Leta Baptist on 10/31/2015 and scored 24/30 on the MMSE. At this most recent visit, she reported concerns about memory decline. The patient also has a history of neuropathyand post-concussion headaches.  At today's visit, the patient and her husband reported concerns about failing memory and inability to recall names. She has become concerned in the past year and her husband has noticed more in the past 6-8 months. He feels that the memory issues came on "pretty suddenly". She feels they're worsening over time. She has had 4 surgeries with anesthesia in one year. She and her husband denied any pronounced cognitive or behavioral change after she sustained the falls in late 2015 and early 2016. They reported that her headaches have been relatively minimal over the past 2 months so it was decided to reduce gabapentin. Unfortunately, she started having pain again this morning on the right side of her face where she fell in the past.  The patient reported that her memory retention used to be an asset but is  now an area of significant weakness. She reported that she had to give up all 3 bridge clubsin whichshe was previously participating because she could not remember what people had just said and she could not learn the new conventions.  Upon direct questioning, the patient and her husband also reportedthe following:   Forgetting recent conversations/events: Yes, particularly things she has been told . More difficult to retrieve recent events. Repeating statements/questions: Yes, occasionally Misplacing/losing items: Yes Forgetting appointments or other obligations: No, because her husband helps. She thinks she might forget without Phil's help. She does have difficulty thinking ahead to upcoming appointments.  Forgetting to take medications: Phil prepares her pillboxes and reminds her to take them.   Difficulty concentrating: Yes butshe says she hasalways had difficulty with this (e.g., daydreaming in school). Starting but not finishing tasks: No, if someone is depending on me, I finish it by a deadline. Her husband agrees she follows through on things quite well.   Word-finding difficulty: Yes. She has to talk around the word. Her husband notices this as well.  Writing difficulty: Making more errors Spelling difficulty: No Comprehension difficulty: She says she reads over things several times but that has always been the case, even back in school.  Getting lost when driving: She has not been able to drive because of neuropathy inherfeet over past several months, but recently she is doing some"minor driving"when her husband is with her. Uncertain about directions when driving or passenger: She is becoming more confused about locations in [place], where roads and familiar places arelocated.   Her  husband manages the finances. She reported she has never been good with numbers and therefore has never been responsible for the finances. Her husband also does the majority of the  cooking although when they're having company she will prepare main dishes.  The patient does note a lifelong history of anxiety. She has always been perfectionistic and somewhat rigid. She worries a lot about upcoming events and how they will go. Her husband noted that she demonstrates new nervous habits -i.e., frequently picking fingernails. She takes Lorazepam PRN. She is also now taking Buspar (started 4-5 mos ago); it is unclear if this has been beneficial.The dosage of Buspar was recently increased.Her husbandsays she gets "inordinately concerned" about things. She has difficulty sleeping because she is up worrying about things. She takes 1/3 of and Ambientabletsome nights. Her husband thinks she is sleeping reasonably well. She does take daytime naps sometimes.  The patient also reported significant grief and depression surrounding the loss of her son to leukemia almost 2 years ago. She reported, "It tore our lives apart, its the worst thing that has happened and my life." She did see a counselor through hospice for a while. She denied any prior history of depression before her son's death.  The patient reported that she feels she does not have the enthusiasm that she used to have. She reported that she is more passive. She also reported reduced energy. About 3 months ago, her husband reported that she was eating very little. However her appetite has recently increased and she is eating normal meals again.  [name] reported that she used to enjoy an evening cocktail but since her concussion and she has developed headaches when she drinks alcohol. Due to this, she quit drinking. Seemingly associated with her quitting drinking, she lost 33 pounds.  Most recent neuroimaging on file is a CT of the head from 04/08/2015. This reportedly revealed: 1. Mild cortical atrophy, unchanged when compared to the 01/02/2014 CT scan. The extent of atrophy is typical for age. 2. Mild to moderate  chronic microvascular ischemic changes, essentially unchanged when compared to the prior scan. 3. There are no acute findings.   Results of 11/2015 neurocognitive evaluation [12/19/2015]: Clinical Impressions:Mild dementia, unspecified (rule out Alzheimer's disease). Generalized anxiety disorder. Results of cognitive testing were abnormal; specifically,there were several areas of impairment, including psychomotor processing speed, confrontation naming, auditory comprehension, encoding and retrieval of both verbal and non-verbal information, mental flexibility and set-shifting, and clock drawing. Furthermore, there is evidence that her cognitive deficits are interfering with her ability to manage complex ADLs such as medications, appointments and driving. As such, diagnostic criteria for a dementia syndrome are met. The patient's cognitive profile does not clearly reflect cortical versus subcortical features, and she may have both cortical and subcortical involvement. Based on her husband's report of course of symptoms, these deficits do not appear to be a direct result ofher concussions in 2015/2016. I suspect theremay bean underlying neurodegenerative disorder such as Alzheimer's disease that may have been exacerbated by multiple surgeries with anesthesia. Vascular dementia, due to chronic small vessel ischemic changes, could also be possible. The patient also has a history of generalized anxiety disorder and continues to demonstrate symptoms of this disorder.    Interim History and Current Functioning [06/20/2017]: Since undergoing initial evaluation with me 1.5 years ago, the patient and her husband report worsening cognitive functioning. She will have long conversations with new people they meet and then the next day if she sees them she does  not recognize them or recall having had a conversation. She does recognize friends/family that she has known for a long time but she has trouble  recalling names of friends if she hasn't seen them in a while. She has some difficulty navigating where things are at [place]. Her husband assists her with most things. He does all the driving and manages her medications and appointments. He has always managed the finances. Their meals are provided by [employer]. She is still a great conversationalist, per her husband, but does repeat stories. She continues to have word finding difficulty. She also loses her train of thought in speaking. Comprehension is reportedly intact.   Her husband reports that around the end of summer 2018 she started having even more falls and he could see that they were going to need increased care, so they moved to [place] in October. Unfortunately they have not been very happy there. They will be moving to [place] as soon as an apartment comes available.   Her husband does not believe she appears significantly anxious. She is no longer on Buspar. She takes lorazepam once daily. She is not having any difficulty sleeping (takes 1/3 of ambien tablet nightly). She takes a daytime nap. She has low energy at times. Appetite is normal. She did not drink alcohol for about a year as she found it was giving her headaches. Recently she started drinking some of her husband's beer. It doesn't seem to be causing headaches. She has less than one beer per day.   She complains of tremendous arthritis and neck pain.   I see from reviewing her records that she was seen at [facility] ED in 01/2016 when she hit her head on a car door. There was no loss of consciousness but her family felt she was more confused since then. CT head was negative.  I also see a downward trend in MMSE scores over time at Dr. Gladstone Lighter visits:  10/31/2015: 24/30 03/26/2016: 23/30 03/26/2017: 21/30    Social History: Born/Raised: [place] Education: Bachelor's degree in Network engineer, Master's in guidance  counseling. 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 She was a homemaker when her children were young and in school. She did substitute teaching. For most of heradultyears, she dedicated herself to PTA, volunteeringand various community organizations. Marital history: Married since 31. They had 3 children; one is now deceased.    Medical History:  Past Medical History:  Diagnosis Date  . Allergic rhinitis   . Anxiety   . Arthritis   . Breast cancer ([place])    left breast cancer  . Complication of anesthesia    memory loss  . Concussion 2015   fell and hit head  . Depression    situational depression  . Diverticulosis   . GERD (gastroesophageal reflux disease)   . Headache(784.0)    sinus HAs  . Hyperlipidemia   . Neuromuscular disorder (HCC)    neuropathy- nerve damage in neck causes nerve damage in toes    Current medications:  Outpatient Encounter Medications as of 07/01/2017  Medication Sig  . acetaminophen (TYLENOL) 325 MG tablet Take 2 tablets (650 mg total) by mouth every 6 (six) hours as needed for mild pain, moderate pain or fever.  Marland Kitchen aspirin 81 MG chewable tablet Chew by mouth daily.  . busPIRone (BUSPAR) 10 MG tablet Take 10 mg by mouth 2 (two) times daily.  Marland Kitchen  Calcium Carbonate-Vitamin D (CALCIUM 600+D) 600-200 MG-UNIT TABS Take 1 tablet by mouth 2 (two) times daily.  Marland Kitchen esomeprazole (NEXIUM) 20 MG capsule Take 20 mg by mouth every evening.  . fexofenadine (ALLEGRA) 180 MG tablet Take 180 mg by mouth every other day.   . gabapentin (NEURONTIN) 300 MG capsule Take 1 capsule (300 mg total) by mouth 2 (two) times daily. (Patient not taking: Reported on 03/26/2017)  . LORazepam (ATIVAN) 0.5 MG tablet Take 0.5 mg by mouth every 8 (eight) hours as needed for anxiety.  . Multiple Vitamin (MULTIVITAMIN WITH MINERALS) TABS tablet Take 1 tablet by mouth daily.  . nortriptyline  (PAMELOR) 25 MG capsule Take 25 mg by mouth at bedtime.  . Omega-3 Fatty Acids (FISH OIL) 1200 MG CAPS Take 1,200 mg by mouth 2 (two) times daily.  . polyethylene glycol (MIRALAX / GLYCOLAX) packet Take 17 g by mouth 2 (two) times daily.  . raloxifene (EVISTA) 60 MG tablet Take 60 mg by mouth daily.  . sertraline (ZOLOFT) 25 MG tablet 25 mg daily.  . SUPER B COMPLEX/C PO Take by mouth. Daily  . traMADol (ULTRAM) 50 MG tablet Take 1-2 tablets (50-100 mg total) by mouth every 6 (six) hours as needed. (Patient taking differently: Take 50-100 mg by mouth every 6 (six) hours as needed for moderate pain. )  . zolpidem (AMBIEN) 10 MG tablet Take 3.33 mg by mouth at bedtime as needed for sleep. 1/3 tablet   No facility-administered encounter medications on file as of 07/01/2017.    Patient's husband reports she is not taking Buspar.    Current Examination:  Behavioral Observations:  Appearance: Appropriately dressed but mildly disheveled compared to last evaluation in 2017 Gait:Ambulated independently,mild unsteadiness observed Speech: Generally fluent but stuttering at times with moderate word finding (increased from 2017). She also loses her train of thought and has to be redirected to the original question. Thought process: Tangential Affect: Full, appropriate to context, mildly anxious Interpersonal: Very pleasant, appropriate, engaged Orientation: Oriented to person, place, current year and current day. Disoriented to month ("February") and date. Accurately named the current President but could not name his predecessor.   Tests Administered: . Wechsler Adult Intelligence Scale-Fourth Edition (WAIS-IV): Similarities, Music therapist, Coding and Digit Span subtests . Wechsler Memory Scale-Fourth Edition (WMS-IV) Older Adult Version (ages 21-90): Logical Memory I, II and Recognition subtests  . [employer] Verbal Learning Test - 2nd Edition (CVLT-2) Short Form . Repeatable Battery for the  Assessment of Neuropsychological Status (RBANS) Form A:  Figure Copy and Recall subtests and Semantic Fluency subtest . Neuropsychological Assessment Battery (NAB) Language Module, Form 1: Naming subtest . Boston Diagnostic Aphasia Examination: Complex Ideational Material subtest . Controlled Oral Word Association Test (COWAT) . Trail Making Test A and B . Clock drawing test . Geriatric Depression Scale (GDS) 15 Item . Generalized Anxiety Disorder - 7 item screener (GAD-7)  Test Results: Note: Standardized scores are presented only for use by appropriately trained professionals and to allow for any future test-retest comparison. These scores should not be interpreted without consideration of all the information that is contained in the rest of the report. The most recent standardization samples from the test publisher or other sources were used whenever possible to derive standard scores; scores were corrected for age, gender, ethnicity and education when available.   Test Scores:  Test Name Raw Score Standardized Score Descriptor  WAIS-IV Subtests     Similarities 12/36 ss= 6 Low average  Block Design 12/66 ss=  6 Low average  Coding 13/135 ss= 5 Borderline  Digit Span Forward 9/16 ss= 10 Average  Digit Span Backward 6/16 ss= 9 Average  WMS-IV Subtests     LM I 10/53 ss= 5 Borderline  LM II 0/39 ss= 3 Impaired  LM II Recognition 11/23 Cum %: 3-9 Impaired  RBANS Subtests     Figure Copy 9/20 Z= -4.2 Severely impaired  Figure Recall 0/20 Z= -2.8 Severely impaired  Semantic Fluency 7/40 Z= -2.8 Severely impaired  CVLT-II Scores     Trial 1 3/9 Z= -2.5 Impaired  Trial 4 4/9 Z= -2 Impaired  Trials 1-4 total 11/36 T= 19 Severely impaired  SD Free Recall 0/9 Z= -2.5 Impaired  LD Free Recall 0/9 Z= -2.5 Impaired  LD Cued Recall 0/9 Z= -3.5 Severely impaired  Recognition Discriminability 5/9 hits 4 false positives Z= -1.5 Borderline  Forced Choice Recognition 9/9  WNL  NAB Language  subtest     Naming 17/31 T= 19 Severely impaired  BDAE Subtest     Complex Ideational Material 5/6  Impaired  COWAT-FAS 29 T= 43 Average  COWAT-Animals 8 T= 31 Borderline  Trail Making Test A  92" 1 error T= 38 Low average  Trail Making Test B  Pt unable    Impaired  Clock Drawing   Impaired  GDS-15 2/15  WNL  GAD-7 9/21  Mild      Description of Test Results:  Premorbid verbal intellectual abilities were estimated to have been within the average range based on a test of word reading. Psychomotor processing speed was borderline to low average (reduced from 11/2015). Auditory attention and working memory were average (stable). Visual-spatial construction ranged from low average to severely impaired (decline). Language abilities were below expectation. Specifically, confrontation naming was severely impaired (decline), and semantic verbal fluency was borderline to severely impaired (decline). Auditory comprehension of complex ideational material was impaired (stable). With regard to verbal memory, encoding and acquisition of non-contextual information (i.e., word list) was severely impaired (decline). After a brief distracter task, free recall was impaired (0/9 items, decline). After a delay, free recall was impaired (0/9 items, decline). Cued recall was severely impaired (0/9 items). Performance on a yes/no recognition task was impaired (decline). On another verbal memory test, encoding and acquisition of contextual auditory information (i.e., short stories) was borderline (stable). After a delay, free recall was impaired (stable). Performance on a yes/no recognition task was impaired. With regard to non-verbal memory, delayed free recall of visual information was severely impaired (stable). Executive functioning was variable. Mental flexibility and set-shifting were impaired; she was unable to complete Trails B (decline). Verbal fluency with phonemic search restrictions was average (stable).  Verbal abstract reasoning was low average (decline). Performance on a clock drawing task was impaired (stable). On a self-report measure of mood, the patient's responses were not indicative of clinically significant depression at the present time. On a self-report measure of anxiety, the patient endorsed mild generalized anxiety characterized by inability to control worrying, excessive worries, nervousness, difficulty relaxing and irritability.    Clinical Impressions: Mild dementia, most likely secondary to Alzheimer's disease, without behavioral disturbance. Generalized anxiety disorder, mild. Results of cognitive testing unfortunately revealed several declines in performance compared to results from 11/2015. Decline was observed in Control and instrumentation engineer, abstract reasoning, verbal memory, confrontation naming, semantic verbal fluency, and processing speed. The decline and overall cognitive profile suggest neurodegenerative dementia, most likely Alzheimer's disease. I would still consider stage of dementia to be mild although dementia has  progressed from last evaluation. Fortunately she is not demonstrating behavioral disturbance and mood appears relatively good. She continues to demonstrate some generalized anxiety.   Recommendations/Plan: Based on the findings of the present evaluation, the following recommendations are offered:  1. The patient's husband is providing excellent support, and he will benefit from education and support for himself as a caregiver. I provided information on local resources and support groups. 2. She will need continued assistance with all instrumental ADLs (ie, transportation, bills/finances, appointments, medications, meals). 3. Have a regular schedule with a predictable routine is recommended to optimize cognitive functioning. She should continue to have regular involvement in social activities, and continue to get safe cardiovascular exercise.  4. From a  neuropsychological standpoint, she appears to be an appropriate candidate for cholinesterase inhibitor therapy. They will follow up with her PCP about this. 5. I recommend minimizing lorazepam use as much as possible, given the increased risk of confusion and falls with this medication in the elderly and in those with dementia.   Feedback to Patient: [name] and her husband returned for a feedback appointment on 07/01/2017 to review the results of her neuropsychological evaluation with this provider. 60 minutes face-to-face time was spent reviewing her test results, my impressions and my recommendations as detailed above.   Thank you for your referral of [name]. Please feel free to contact me if you have any questions or concerns regarding this report.    Total time spent on this patient's case: 70 minutes for neurobehavioral status exam with psychologist (CPT code [phone]); 90 minutes of testing/scoring by psychometrician under psychologist's supervision (CPT codes [phone], [phone] units); 180 minutes for integration of patient data, interpretation of standardized test results and clinical data, clinical decision making, treatment planning and preparation of this report, and interactive feedback with review of results to the patient/family by psychologist (CPT codes [phone], [phone] units).    A copy of this report will be mailed to the patient per her request.

## 2017-07-01 ENCOUNTER — Encounter: Payer: Self-pay | Admitting: Psychology

## 2017-07-01 ENCOUNTER — Ambulatory Visit (INDEPENDENT_AMBULATORY_CARE_PROVIDER_SITE_OTHER): Payer: Medicare HMO | Admitting: Psychology

## 2017-07-01 DIAGNOSIS — G301 Alzheimer's disease with late onset: Secondary | ICD-10-CM

## 2017-07-01 DIAGNOSIS — F0280 Dementia in other diseases classified elsewhere without behavioral disturbance: Secondary | ICD-10-CM | POA: Diagnosis not present

## 2017-07-17 DIAGNOSIS — Z6826 Body mass index (BMI) 26.0-26.9, adult: Secondary | ICD-10-CM | POA: Diagnosis not present

## 2017-07-17 DIAGNOSIS — W01198A Fall on same level from slipping, tripping and stumbling with subsequent striking against other object, initial encounter: Secondary | ICD-10-CM | POA: Diagnosis not present

## 2017-07-17 DIAGNOSIS — E038 Other specified hypothyroidism: Secondary | ICD-10-CM | POA: Diagnosis not present

## 2017-07-17 DIAGNOSIS — F418 Other specified anxiety disorders: Secondary | ICD-10-CM | POA: Diagnosis not present

## 2017-07-17 DIAGNOSIS — R413 Other amnesia: Secondary | ICD-10-CM | POA: Diagnosis not present

## 2017-07-26 DIAGNOSIS — L720 Epidermal cyst: Secondary | ICD-10-CM | POA: Diagnosis not present

## 2017-07-26 DIAGNOSIS — L900 Lichen sclerosus et atrophicus: Secondary | ICD-10-CM | POA: Diagnosis not present

## 2017-08-15 ENCOUNTER — Telehealth: Payer: Self-pay | Admitting: Diagnostic Neuroimaging

## 2017-08-15 DIAGNOSIS — G4489 Other headache syndrome: Secondary | ICD-10-CM

## 2017-08-15 NOTE — Telephone Encounter (Signed)
Pts husband [name] requesting a call stating the pt has worsening symptoms and would like to see Dr. Leta Baptist again. Once advised the next appt date of 12/2 [name] laughed and stated "just have the RN give me a call". Please call to advise

## 2017-08-16 NOTE — Telephone Encounter (Signed)
Received call back from patient's husband, and the call was on speaker. He stated his wife began having headaches in her "upper right skull" . When she shakes her head or leans over they come on. She is  okay if her head is stable. A headache comes on with significant motion of head, and she is waking with headaches. These symptoms have been forx 4 days. The patient stated she is concerned she has brain tumor. She can't have mri due to cochlear implant. They are asking who should she see, what should she do to get this analyzed. They are out of town today, returning probably tomorrow. Doren Custard asked to be called on [phone] cell phone.  This RN advised will send to Dr Leta Baptist and hope to call back today before this office closes. They verbalized understanding, appreciation.

## 2017-08-16 NOTE — Telephone Encounter (Signed)
LVM requesting call back to discuss.

## 2017-08-16 NOTE — Telephone Encounter (Addendum)
Attempted to reach patient on his cell phone as he requested because they are out of town. Got VM and the message stated "I do not check my messages on this phone."  Called home phone, LVM advising Dr Leta Baptist recommended she go to ED for evaluation. Advised he has ordered a CT scan of her head. It will go through insurance authorization next week. She will get a call to schedule it. Advised she go to ED if her headaches worsen, are intolerable, she develops other symptoms. Otherwise, if they need service, this office has dr on call. This RN reinforced advice to go to ED, not call office for increased, worsening symptoms.  Left office number.

## 2017-08-16 NOTE — Telephone Encounter (Signed)
Please offer for patient to go to ER for urgent evaluation. Otherwise, I will order CT head (without) to be done within next few days.  Orders Placed This Encounter  Procedures  . CT HEAD WO CONTRAST   Penni Bombard, MD 09/23/2424, 83:41 PM Certified in Neurology, Neurophysiology and Neuroimaging  [facility] [address] [place], [place] [address] [phone]

## 2017-08-19 ENCOUNTER — Telehealth: Payer: Self-pay | Admitting: Diagnostic Neuroimaging

## 2017-08-19 NOTE — Telephone Encounter (Signed)
[name] [name]: [id number] (exp. 08/19/17 to 09/18/17) order sent to GI. They will reach out to the pt to schedule.

## 2017-08-19 NOTE — Telephone Encounter (Signed)
Patient's husband calling with patient on the phone stating she did receive previous message and apologized because cell phone was not turned on. A returned call is not needed.

## 2017-08-20 ENCOUNTER — Ambulatory Visit
Admission: RE | Admit: 2017-08-20 | Discharge: 2017-08-20 | Disposition: A | Discharge status: Home / Self Care | Payer: Medicare HMO | Source: Ambulatory Visit | Attending: Diagnostic Neuroimaging | Admitting: Diagnostic Neuroimaging

## 2017-08-20 DIAGNOSIS — G4489 Other headache syndrome: Secondary | ICD-10-CM

## 2017-08-21 ENCOUNTER — Telehealth: Payer: Self-pay | Admitting: *Deleted

## 2017-08-21 NOTE — Telephone Encounter (Signed)
Spoke with patient and husband (on speaker) and informed them her CT scan of head showed unremarkable results. Advised there are no acute findings, and it is unchanged from CT head 03/2015.  They verbalized appreciation for the promptness of care. This RN advised she monitor her symptoms and may take Tylenol as needed, and call for any questions or concerns.. She verbalized understanding.

## 2017-08-21 NOTE — Telephone Encounter (Signed)
Patient's husband calling stating patient had a CT scan yesterday and wanted to thank Verneita Griffes for all her help. He was very [employer] attorney.

## 2017-09-13 DIAGNOSIS — Z23 Encounter for immunization: Secondary | ICD-10-CM | POA: Diagnosis not present

## 2017-09-20 ENCOUNTER — Encounter: Payer: Self-pay | Admitting: Licensed Clinical Social Worker

## 2017-10-17 DIAGNOSIS — J019 Acute sinusitis, unspecified: Secondary | ICD-10-CM | POA: Diagnosis not present

## 2017-10-17 DIAGNOSIS — R05 Cough: Secondary | ICD-10-CM | POA: Diagnosis not present

## 2017-11-07 DIAGNOSIS — R32 Unspecified urinary incontinence: Secondary | ICD-10-CM | POA: Diagnosis not present

## 2017-11-07 DIAGNOSIS — Z0289 Encounter for other administrative examinations: Secondary | ICD-10-CM | POA: Diagnosis not present

## 2017-11-07 DIAGNOSIS — M542 Cervicalgia: Secondary | ICD-10-CM | POA: Diagnosis not present

## 2017-11-07 DIAGNOSIS — Z6826 Body mass index (BMI) 26.0-26.9, adult: Secondary | ICD-10-CM | POA: Diagnosis not present

## 2017-11-14 DIAGNOSIS — M25531 Pain in right wrist: Secondary | ICD-10-CM | POA: Diagnosis not present

## 2018-07-11 IMAGING — DX DG CHEST 1V
1 series · 1 of 1 positions shown · non-contrast
Comparison: Chest radiograph April 03, 2007

CLINICAL DATA: Wrist fracture, preoperative evaluation. History of
breast cancer.

EXAM:
CHEST 1 VIEW

[chest ap]
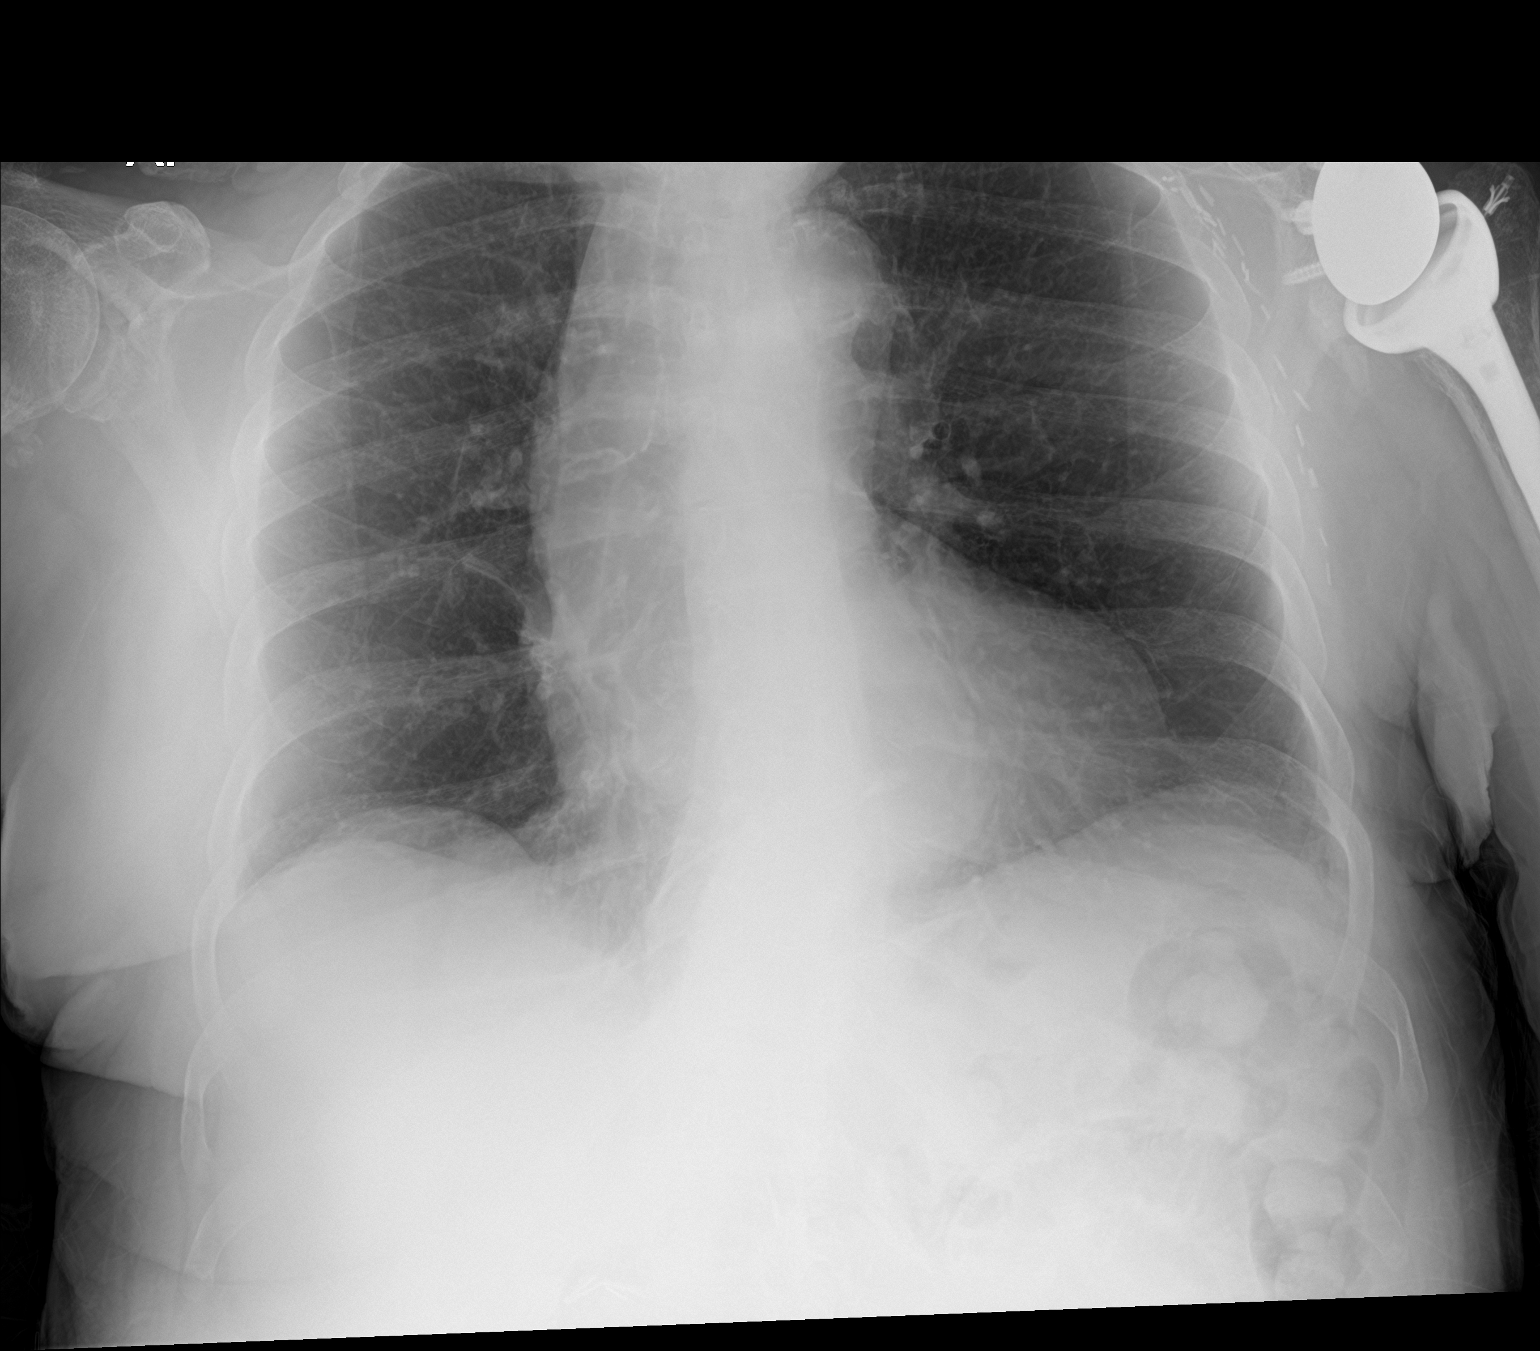

[1 of 1 positions shown; findings below may reference images not displayed]

FINDINGS: Cardiac silhouette is normal in size. Mildly tortuous calcified
aorta. No pleural effusion focal consolidation. No pneumothorax.
Status post LEFT shoulder arthroplasty. Surgical clips LEFT chest
wall. Small probable loose bodies about RIGHT shoulder, incompletely
imaged, status post RIGHT distal clavicle resection. Surgical clips
in the included right abdomen compatible with cholecystectomy.
IMPRESSION: No acute cardiopulmonary process.
